# Patient Record
Sex: Male | Born: 1960 | Hispanic: Yes | Marital: Married | Smoking: Never smoker
Health system: Southern US, Community
[De-identification: ages and names within clinical notes are randomized; demographics above are authoritative.]

## PROBLEM LIST (undated history)

## (undated) DIAGNOSIS — G4733 Obstructive sleep apnea (adult) (pediatric): Secondary | ICD-10-CM

## (undated) DIAGNOSIS — I429 Cardiomyopathy, unspecified: Secondary | ICD-10-CM

## (undated) DIAGNOSIS — R0902 Hypoxemia: Secondary | ICD-10-CM

## (undated) DIAGNOSIS — R0600 Dyspnea, unspecified: Secondary | ICD-10-CM

## (undated) DIAGNOSIS — R9431 Abnormal electrocardiogram [ECG] [EKG]: Secondary | ICD-10-CM

## (undated) DIAGNOSIS — Z9989 Dependence on other enabling machines and devices: Secondary | ICD-10-CM

## (undated) DIAGNOSIS — I447 Left bundle-branch block, unspecified: Secondary | ICD-10-CM

## (undated) DIAGNOSIS — G8929 Other chronic pain: Secondary | ICD-10-CM

## (undated) DIAGNOSIS — J449 Chronic obstructive pulmonary disease, unspecified: Secondary | ICD-10-CM

## (undated) DIAGNOSIS — G4730 Sleep apnea, unspecified: Secondary | ICD-10-CM

## (undated) DIAGNOSIS — R519 Headache, unspecified: Secondary | ICD-10-CM

## (undated) DIAGNOSIS — I5022 Chronic systolic (congestive) heart failure: Secondary | ICD-10-CM

## (undated) DIAGNOSIS — R51 Headache: Secondary | ICD-10-CM

## (undated) DIAGNOSIS — T7840XA Allergy, unspecified, initial encounter: Secondary | ICD-10-CM

## (undated) DIAGNOSIS — R52 Pain, unspecified: Secondary | ICD-10-CM

## (undated) DIAGNOSIS — M549 Dorsalgia, unspecified: Secondary | ICD-10-CM

## (undated) DIAGNOSIS — I2510 Atherosclerotic heart disease of native coronary artery without angina pectoris: Secondary | ICD-10-CM

## (undated) DIAGNOSIS — I10 Essential (primary) hypertension: Secondary | ICD-10-CM

## (undated) DIAGNOSIS — F4310 Post-traumatic stress disorder, unspecified: Secondary | ICD-10-CM

## (undated) DIAGNOSIS — K219 Gastro-esophageal reflux disease without esophagitis: Secondary | ICD-10-CM

## (undated) HISTORY — PX: CARDIAC CATHETERIZATION: SHX172

## (undated) HISTORY — DX: Allergy, unspecified, initial encounter: T78.40XA

## (undated) HISTORY — DX: Left bundle-branch block, unspecified: I44.7

## (undated) HISTORY — DX: Dependence on other enabling machines and devices: Z99.89

## (undated) HISTORY — PX: THUMB ARTHROSCOPY: SHX2509

## (undated) HISTORY — DX: Obstructive sleep apnea (adult) (pediatric): G47.33

## (undated) HISTORY — DX: Chronic obstructive pulmonary disease, unspecified: J44.9

## (undated) HISTORY — DX: Hypoxemia: R09.02

## (undated) HISTORY — DX: Chronic systolic (congestive) heart failure: I50.22

## (undated) HISTORY — DX: Atherosclerotic heart disease of native coronary artery without angina pectoris: I25.10

## (undated) HISTORY — PX: ANKLE SURGERY: SHX546

## (undated) HISTORY — PX: HAND SURGERY: SHX662

---

## 2013-07-18 DIAGNOSIS — Z1389 Encounter for screening for other disorder: Secondary | ICD-10-CM | POA: Diagnosis not present

## 2013-07-18 DIAGNOSIS — Z7729 Contact with and (suspected ) exposure to other hazardous substances: Secondary | ICD-10-CM | POA: Diagnosis not present

## 2013-07-18 DIAGNOSIS — Z6829 Body mass index (BMI) 29.0-29.9, adult: Secondary | ICD-10-CM | POA: Diagnosis not present

## 2013-07-18 DIAGNOSIS — I059 Rheumatic mitral valve disease, unspecified: Secondary | ICD-10-CM | POA: Diagnosis not present

## 2013-07-18 DIAGNOSIS — I10 Essential (primary) hypertension: Secondary | ICD-10-CM | POA: Diagnosis not present

## 2013-07-18 DIAGNOSIS — I428 Other cardiomyopathies: Secondary | ICD-10-CM | POA: Diagnosis not present

## 2013-07-18 DIAGNOSIS — J40 Bronchitis, not specified as acute or chronic: Secondary | ICD-10-CM | POA: Diagnosis not present

## 2013-07-18 DIAGNOSIS — I5022 Chronic systolic (congestive) heart failure: Secondary | ICD-10-CM | POA: Diagnosis not present

## 2013-07-18 DIAGNOSIS — J45909 Unspecified asthma, uncomplicated: Secondary | ICD-10-CM | POA: Diagnosis not present

## 2013-07-18 DIAGNOSIS — Z0289 Encounter for other administrative examinations: Secondary | ICD-10-CM | POA: Diagnosis not present

## 2013-07-18 DIAGNOSIS — I509 Heart failure, unspecified: Secondary | ICD-10-CM | POA: Diagnosis not present

## 2013-07-27 DIAGNOSIS — R5381 Other malaise: Secondary | ICD-10-CM | POA: Diagnosis not present

## 2013-07-27 DIAGNOSIS — R5383 Other fatigue: Secondary | ICD-10-CM | POA: Diagnosis not present

## 2013-07-27 DIAGNOSIS — N529 Male erectile dysfunction, unspecified: Secondary | ICD-10-CM | POA: Diagnosis not present

## 2013-07-29 DIAGNOSIS — I447 Left bundle-branch block, unspecified: Secondary | ICD-10-CM | POA: Diagnosis not present

## 2013-07-29 DIAGNOSIS — J40 Bronchitis, not specified as acute or chronic: Secondary | ICD-10-CM | POA: Diagnosis not present

## 2013-07-29 DIAGNOSIS — I10 Essential (primary) hypertension: Secondary | ICD-10-CM | POA: Diagnosis not present

## 2013-07-29 DIAGNOSIS — Z1389 Encounter for screening for other disorder: Secondary | ICD-10-CM | POA: Diagnosis not present

## 2013-07-29 DIAGNOSIS — Z6829 Body mass index (BMI) 29.0-29.9, adult: Secondary | ICD-10-CM | POA: Diagnosis not present

## 2013-07-29 DIAGNOSIS — Z0289 Encounter for other administrative examinations: Secondary | ICD-10-CM | POA: Diagnosis not present

## 2013-07-29 DIAGNOSIS — G4733 Obstructive sleep apnea (adult) (pediatric): Secondary | ICD-10-CM | POA: Diagnosis not present

## 2013-07-29 DIAGNOSIS — R05 Cough: Secondary | ICD-10-CM | POA: Diagnosis not present

## 2013-07-29 DIAGNOSIS — Z6835 Body mass index (BMI) 35.0-35.9, adult: Secondary | ICD-10-CM | POA: Diagnosis not present

## 2013-07-29 DIAGNOSIS — R059 Cough, unspecified: Secondary | ICD-10-CM | POA: Diagnosis not present

## 2013-07-29 DIAGNOSIS — D649 Anemia, unspecified: Secondary | ICD-10-CM | POA: Diagnosis not present

## 2013-08-08 DIAGNOSIS — R1110 Vomiting, unspecified: Secondary | ICD-10-CM | POA: Diagnosis not present

## 2013-08-08 DIAGNOSIS — I2510 Atherosclerotic heart disease of native coronary artery without angina pectoris: Secondary | ICD-10-CM | POA: Diagnosis not present

## 2013-08-08 DIAGNOSIS — D45 Polycythemia vera: Secondary | ICD-10-CM | POA: Diagnosis not present

## 2013-08-08 DIAGNOSIS — T1490XA Injury, unspecified, initial encounter: Secondary | ICD-10-CM | POA: Diagnosis not present

## 2013-08-08 DIAGNOSIS — R059 Cough, unspecified: Secondary | ICD-10-CM | POA: Diagnosis not present

## 2013-08-08 DIAGNOSIS — Z1389 Encounter for screening for other disorder: Secondary | ICD-10-CM | POA: Diagnosis not present

## 2013-08-08 DIAGNOSIS — M545 Low back pain, unspecified: Secondary | ICD-10-CM | POA: Diagnosis not present

## 2013-08-08 DIAGNOSIS — R7989 Other specified abnormal findings of blood chemistry: Secondary | ICD-10-CM | POA: Diagnosis not present

## 2013-08-08 DIAGNOSIS — I10 Essential (primary) hypertension: Secondary | ICD-10-CM | POA: Diagnosis not present

## 2013-08-08 DIAGNOSIS — R918 Other nonspecific abnormal finding of lung field: Secondary | ICD-10-CM | POA: Diagnosis not present

## 2013-08-08 DIAGNOSIS — R05 Cough: Secondary | ICD-10-CM | POA: Diagnosis not present

## 2013-08-08 DIAGNOSIS — E559 Vitamin D deficiency, unspecified: Secondary | ICD-10-CM | POA: Diagnosis not present

## 2013-08-08 DIAGNOSIS — D751 Secondary polycythemia: Secondary | ICD-10-CM | POA: Diagnosis not present

## 2013-08-08 DIAGNOSIS — I509 Heart failure, unspecified: Secondary | ICD-10-CM | POA: Diagnosis not present

## 2013-08-08 DIAGNOSIS — I428 Other cardiomyopathies: Secondary | ICD-10-CM | POA: Diagnosis not present

## 2013-08-09 DIAGNOSIS — R911 Solitary pulmonary nodule: Secondary | ICD-10-CM | POA: Diagnosis not present

## 2013-08-09 DIAGNOSIS — R0989 Other specified symptoms and signs involving the circulatory and respiratory systems: Secondary | ICD-10-CM | POA: Diagnosis not present

## 2013-08-09 DIAGNOSIS — R0609 Other forms of dyspnea: Secondary | ICD-10-CM | POA: Diagnosis not present

## 2013-08-09 DIAGNOSIS — R059 Cough, unspecified: Secondary | ICD-10-CM | POA: Diagnosis not present

## 2013-08-09 DIAGNOSIS — D45 Polycythemia vera: Secondary | ICD-10-CM | POA: Diagnosis not present

## 2013-08-09 DIAGNOSIS — I428 Other cardiomyopathies: Secondary | ICD-10-CM | POA: Diagnosis not present

## 2013-08-09 DIAGNOSIS — Q249 Congenital malformation of heart, unspecified: Secondary | ICD-10-CM | POA: Diagnosis not present

## 2013-08-09 DIAGNOSIS — Z6829 Body mass index (BMI) 29.0-29.9, adult: Secondary | ICD-10-CM | POA: Diagnosis not present

## 2013-08-09 DIAGNOSIS — R05 Cough: Secondary | ICD-10-CM | POA: Diagnosis not present

## 2013-08-09 DIAGNOSIS — R042 Hemoptysis: Secondary | ICD-10-CM | POA: Diagnosis not present

## 2013-08-09 DIAGNOSIS — R6889 Other general symptoms and signs: Secondary | ICD-10-CM | POA: Diagnosis not present

## 2013-08-09 DIAGNOSIS — I509 Heart failure, unspecified: Secondary | ICD-10-CM | POA: Diagnosis not present

## 2013-08-09 DIAGNOSIS — I10 Essential (primary) hypertension: Secondary | ICD-10-CM | POA: Diagnosis not present

## 2013-08-10 DIAGNOSIS — IMO0002 Reserved for concepts with insufficient information to code with codable children: Secondary | ICD-10-CM | POA: Diagnosis not present

## 2013-08-10 DIAGNOSIS — G4733 Obstructive sleep apnea (adult) (pediatric): Secondary | ICD-10-CM | POA: Diagnosis not present

## 2013-08-10 DIAGNOSIS — M539 Dorsopathy, unspecified: Secondary | ICD-10-CM | POA: Diagnosis not present

## 2013-08-10 DIAGNOSIS — M5126 Other intervertebral disc displacement, lumbar region: Secondary | ICD-10-CM | POA: Diagnosis not present

## 2013-08-10 DIAGNOSIS — IMO0001 Reserved for inherently not codable concepts without codable children: Secondary | ICD-10-CM | POA: Diagnosis not present

## 2013-08-11 DIAGNOSIS — M545 Low back pain, unspecified: Secondary | ICD-10-CM | POA: Diagnosis not present

## 2013-08-11 DIAGNOSIS — Z09 Encounter for follow-up examination after completed treatment for conditions other than malignant neoplasm: Secondary | ICD-10-CM | POA: Diagnosis not present

## 2013-08-11 DIAGNOSIS — I10 Essential (primary) hypertension: Secondary | ICD-10-CM | POA: Diagnosis not present

## 2013-08-18 DIAGNOSIS — G4733 Obstructive sleep apnea (adult) (pediatric): Secondary | ICD-10-CM | POA: Diagnosis not present

## 2013-08-24 DIAGNOSIS — E041 Nontoxic single thyroid nodule: Secondary | ICD-10-CM | POA: Diagnosis not present

## 2013-08-24 DIAGNOSIS — I428 Other cardiomyopathies: Secondary | ICD-10-CM | POA: Diagnosis not present

## 2013-08-24 DIAGNOSIS — R911 Solitary pulmonary nodule: Secondary | ICD-10-CM | POA: Diagnosis not present

## 2013-08-24 DIAGNOSIS — Z6829 Body mass index (BMI) 29.0-29.9, adult: Secondary | ICD-10-CM | POA: Diagnosis not present

## 2013-08-24 DIAGNOSIS — D45 Polycythemia vera: Secondary | ICD-10-CM | POA: Diagnosis not present

## 2013-08-24 DIAGNOSIS — Z1389 Encounter for screening for other disorder: Secondary | ICD-10-CM | POA: Diagnosis not present

## 2013-08-24 DIAGNOSIS — G4733 Obstructive sleep apnea (adult) (pediatric): Secondary | ICD-10-CM | POA: Diagnosis not present

## 2013-08-24 DIAGNOSIS — J45902 Unspecified asthma with status asthmaticus: Secondary | ICD-10-CM | POA: Diagnosis not present

## 2013-08-24 DIAGNOSIS — N529 Male erectile dysfunction, unspecified: Secondary | ICD-10-CM | POA: Diagnosis not present

## 2013-09-06 DIAGNOSIS — I10 Essential (primary) hypertension: Secondary | ICD-10-CM | POA: Diagnosis not present

## 2013-09-06 DIAGNOSIS — R059 Cough, unspecified: Secondary | ICD-10-CM | POA: Diagnosis not present

## 2013-09-06 DIAGNOSIS — Q339 Congenital malformation of lung, unspecified: Secondary | ICD-10-CM | POA: Diagnosis not present

## 2013-09-06 DIAGNOSIS — R05 Cough: Secondary | ICD-10-CM | POA: Diagnosis not present

## 2013-10-11 DIAGNOSIS — I428 Other cardiomyopathies: Secondary | ICD-10-CM | POA: Diagnosis not present

## 2013-10-11 DIAGNOSIS — J45909 Unspecified asthma, uncomplicated: Secondary | ICD-10-CM | POA: Diagnosis not present

## 2013-10-11 DIAGNOSIS — Z6830 Body mass index (BMI) 30.0-30.9, adult: Secondary | ICD-10-CM | POA: Diagnosis not present

## 2013-10-11 DIAGNOSIS — G4733 Obstructive sleep apnea (adult) (pediatric): Secondary | ICD-10-CM | POA: Diagnosis not present

## 2013-10-11 DIAGNOSIS — R911 Solitary pulmonary nodule: Secondary | ICD-10-CM | POA: Diagnosis not present

## 2013-10-11 DIAGNOSIS — D45 Polycythemia vera: Secondary | ICD-10-CM | POA: Diagnosis not present

## 2013-10-18 DIAGNOSIS — I10 Essential (primary) hypertension: Secondary | ICD-10-CM | POA: Diagnosis not present

## 2013-10-18 DIAGNOSIS — I428 Other cardiomyopathies: Secondary | ICD-10-CM | POA: Diagnosis not present

## 2013-10-18 DIAGNOSIS — Z09 Encounter for follow-up examination after completed treatment for conditions other than malignant neoplasm: Secondary | ICD-10-CM | POA: Diagnosis not present

## 2013-10-24 DIAGNOSIS — I5022 Chronic systolic (congestive) heart failure: Secondary | ICD-10-CM | POA: Diagnosis not present

## 2013-10-24 DIAGNOSIS — I119 Hypertensive heart disease without heart failure: Secondary | ICD-10-CM | POA: Diagnosis not present

## 2013-10-24 DIAGNOSIS — E669 Obesity, unspecified: Secondary | ICD-10-CM | POA: Diagnosis not present

## 2013-10-24 DIAGNOSIS — I059 Rheumatic mitral valve disease, unspecified: Secondary | ICD-10-CM | POA: Diagnosis not present

## 2013-11-22 DIAGNOSIS — R911 Solitary pulmonary nodule: Secondary | ICD-10-CM | POA: Diagnosis not present

## 2013-11-22 DIAGNOSIS — E049 Nontoxic goiter, unspecified: Secondary | ICD-10-CM | POA: Diagnosis not present

## 2013-11-22 DIAGNOSIS — Z09 Encounter for follow-up examination after completed treatment for conditions other than malignant neoplasm: Secondary | ICD-10-CM | POA: Diagnosis not present

## 2013-11-22 DIAGNOSIS — I428 Other cardiomyopathies: Secondary | ICD-10-CM | POA: Diagnosis not present

## 2013-11-22 DIAGNOSIS — J45909 Unspecified asthma, uncomplicated: Secondary | ICD-10-CM | POA: Diagnosis not present

## 2013-11-22 DIAGNOSIS — G4733 Obstructive sleep apnea (adult) (pediatric): Secondary | ICD-10-CM | POA: Diagnosis not present

## 2013-11-22 DIAGNOSIS — Z6829 Body mass index (BMI) 29.0-29.9, adult: Secondary | ICD-10-CM | POA: Diagnosis not present

## 2013-11-22 DIAGNOSIS — R918 Other nonspecific abnormal finding of lung field: Secondary | ICD-10-CM | POA: Diagnosis not present

## 2013-11-24 DIAGNOSIS — G4730 Sleep apnea, unspecified: Secondary | ICD-10-CM | POA: Diagnosis not present

## 2013-11-24 DIAGNOSIS — I422 Other hypertrophic cardiomyopathy: Secondary | ICD-10-CM | POA: Diagnosis not present

## 2013-11-24 DIAGNOSIS — Z0289 Encounter for other administrative examinations: Secondary | ICD-10-CM | POA: Diagnosis not present

## 2013-11-24 DIAGNOSIS — D45 Polycythemia vera: Secondary | ICD-10-CM | POA: Diagnosis not present

## 2013-11-24 DIAGNOSIS — Z1389 Encounter for screening for other disorder: Secondary | ICD-10-CM | POA: Diagnosis not present

## 2013-11-24 DIAGNOSIS — Z6830 Body mass index (BMI) 30.0-30.9, adult: Secondary | ICD-10-CM | POA: Diagnosis not present

## 2013-11-24 DIAGNOSIS — I10 Essential (primary) hypertension: Secondary | ICD-10-CM | POA: Diagnosis not present

## 2013-11-28 DIAGNOSIS — E041 Nontoxic single thyroid nodule: Secondary | ICD-10-CM | POA: Diagnosis not present

## 2013-11-30 DIAGNOSIS — Z0189 Encounter for other specified special examinations: Secondary | ICD-10-CM | POA: Diagnosis not present

## 2013-11-30 DIAGNOSIS — D45 Polycythemia vera: Secondary | ICD-10-CM | POA: Diagnosis not present

## 2013-11-30 DIAGNOSIS — I509 Heart failure, unspecified: Secondary | ICD-10-CM | POA: Diagnosis not present

## 2013-11-30 DIAGNOSIS — I428 Other cardiomyopathies: Secondary | ICD-10-CM | POA: Diagnosis not present

## 2013-11-30 DIAGNOSIS — Z0000 Encounter for general adult medical examination without abnormal findings: Secondary | ICD-10-CM | POA: Diagnosis not present

## 2013-11-30 DIAGNOSIS — Z125 Encounter for screening for malignant neoplasm of prostate: Secondary | ICD-10-CM | POA: Diagnosis not present

## 2013-11-30 DIAGNOSIS — Z1159 Encounter for screening for other viral diseases: Secondary | ICD-10-CM | POA: Diagnosis not present

## 2013-11-30 DIAGNOSIS — I447 Left bundle-branch block, unspecified: Secondary | ICD-10-CM | POA: Diagnosis not present

## 2013-11-30 DIAGNOSIS — B372 Candidiasis of skin and nail: Secondary | ICD-10-CM | POA: Diagnosis not present

## 2013-11-30 DIAGNOSIS — R3129 Other microscopic hematuria: Secondary | ICD-10-CM | POA: Diagnosis not present

## 2013-11-30 DIAGNOSIS — G4730 Sleep apnea, unspecified: Secondary | ICD-10-CM | POA: Diagnosis not present

## 2013-11-30 DIAGNOSIS — E042 Nontoxic multinodular goiter: Secondary | ICD-10-CM | POA: Diagnosis not present

## 2013-11-30 DIAGNOSIS — I10 Essential (primary) hypertension: Secondary | ICD-10-CM | POA: Diagnosis not present

## 2013-12-07 DIAGNOSIS — I10 Essential (primary) hypertension: Secondary | ICD-10-CM | POA: Diagnosis not present

## 2013-12-22 DIAGNOSIS — R911 Solitary pulmonary nodule: Secondary | ICD-10-CM | POA: Diagnosis not present

## 2013-12-22 DIAGNOSIS — Z6830 Body mass index (BMI) 30.0-30.9, adult: Secondary | ICD-10-CM | POA: Diagnosis not present

## 2013-12-22 DIAGNOSIS — R209 Unspecified disturbances of skin sensation: Secondary | ICD-10-CM | POA: Diagnosis not present

## 2013-12-22 DIAGNOSIS — G4733 Obstructive sleep apnea (adult) (pediatric): Secondary | ICD-10-CM | POA: Diagnosis not present

## 2013-12-22 DIAGNOSIS — M79609 Pain in unspecified limb: Secondary | ICD-10-CM | POA: Diagnosis not present

## 2013-12-28 DIAGNOSIS — Z131 Encounter for screening for diabetes mellitus: Secondary | ICD-10-CM | POA: Diagnosis not present

## 2013-12-28 DIAGNOSIS — E042 Nontoxic multinodular goiter: Secondary | ICD-10-CM | POA: Diagnosis not present

## 2013-12-28 DIAGNOSIS — Z1389 Encounter for screening for other disorder: Secondary | ICD-10-CM | POA: Diagnosis not present

## 2013-12-28 DIAGNOSIS — I428 Other cardiomyopathies: Secondary | ICD-10-CM | POA: Diagnosis not present

## 2013-12-28 DIAGNOSIS — I509 Heart failure, unspecified: Secondary | ICD-10-CM | POA: Diagnosis not present

## 2013-12-28 DIAGNOSIS — I10 Essential (primary) hypertension: Secondary | ICD-10-CM | POA: Diagnosis not present

## 2014-01-02 DIAGNOSIS — I10 Essential (primary) hypertension: Secondary | ICD-10-CM | POA: Diagnosis not present

## 2014-01-02 DIAGNOSIS — D751 Secondary polycythemia: Secondary | ICD-10-CM | POA: Diagnosis not present

## 2014-01-05 DIAGNOSIS — B351 Tinea unguium: Secondary | ICD-10-CM | POA: Diagnosis not present

## 2014-01-05 DIAGNOSIS — Z6830 Body mass index (BMI) 30.0-30.9, adult: Secondary | ICD-10-CM | POA: Diagnosis not present

## 2014-01-05 DIAGNOSIS — E041 Nontoxic single thyroid nodule: Secondary | ICD-10-CM | POA: Diagnosis not present

## 2014-01-05 DIAGNOSIS — I10 Essential (primary) hypertension: Secondary | ICD-10-CM | POA: Diagnosis not present

## 2014-01-05 DIAGNOSIS — B958 Unspecified staphylococcus as the cause of diseases classified elsewhere: Secondary | ICD-10-CM | POA: Diagnosis not present

## 2014-01-06 DIAGNOSIS — E781 Pure hyperglyceridemia: Secondary | ICD-10-CM | POA: Diagnosis not present

## 2014-01-06 DIAGNOSIS — I10 Essential (primary) hypertension: Secondary | ICD-10-CM | POA: Diagnosis not present

## 2014-01-09 DIAGNOSIS — D750 Familial erythrocytosis: Secondary | ICD-10-CM | POA: Diagnosis not present

## 2014-01-09 DIAGNOSIS — D3000 Benign neoplasm of unspecified kidney: Secondary | ICD-10-CM | POA: Diagnosis not present

## 2014-01-09 DIAGNOSIS — K7689 Other specified diseases of liver: Secondary | ICD-10-CM | POA: Diagnosis not present

## 2014-01-11 DIAGNOSIS — R109 Unspecified abdominal pain: Secondary | ICD-10-CM | POA: Diagnosis not present

## 2014-01-30 DIAGNOSIS — I5022 Chronic systolic (congestive) heart failure: Secondary | ICD-10-CM | POA: Diagnosis not present

## 2014-01-30 DIAGNOSIS — E669 Obesity, unspecified: Secondary | ICD-10-CM | POA: Diagnosis not present

## 2014-01-30 DIAGNOSIS — I119 Hypertensive heart disease without heart failure: Secondary | ICD-10-CM | POA: Diagnosis not present

## 2014-01-30 DIAGNOSIS — I059 Rheumatic mitral valve disease, unspecified: Secondary | ICD-10-CM | POA: Diagnosis not present

## 2014-01-31 DIAGNOSIS — Z6830 Body mass index (BMI) 30.0-30.9, adult: Secondary | ICD-10-CM | POA: Diagnosis not present

## 2014-01-31 DIAGNOSIS — E049 Nontoxic goiter, unspecified: Secondary | ICD-10-CM | POA: Diagnosis not present

## 2014-01-31 DIAGNOSIS — E041 Nontoxic single thyroid nodule: Secondary | ICD-10-CM | POA: Diagnosis not present

## 2014-01-31 DIAGNOSIS — R911 Solitary pulmonary nodule: Secondary | ICD-10-CM | POA: Diagnosis not present

## 2014-01-31 DIAGNOSIS — G4733 Obstructive sleep apnea (adult) (pediatric): Secondary | ICD-10-CM | POA: Diagnosis not present

## 2014-01-31 DIAGNOSIS — J309 Allergic rhinitis, unspecified: Secondary | ICD-10-CM | POA: Diagnosis not present

## 2014-02-16 DIAGNOSIS — I2510 Atherosclerotic heart disease of native coronary artery without angina pectoris: Secondary | ICD-10-CM | POA: Diagnosis not present

## 2014-02-16 DIAGNOSIS — R079 Chest pain, unspecified: Secondary | ICD-10-CM | POA: Diagnosis not present

## 2014-03-14 DIAGNOSIS — R911 Solitary pulmonary nodule: Secondary | ICD-10-CM | POA: Diagnosis not present

## 2014-03-14 DIAGNOSIS — Z6830 Body mass index (BMI) 30.0-30.9, adult: Secondary | ICD-10-CM | POA: Diagnosis not present

## 2014-03-14 DIAGNOSIS — G4733 Obstructive sleep apnea (adult) (pediatric): Secondary | ICD-10-CM | POA: Diagnosis not present

## 2014-03-14 DIAGNOSIS — E049 Nontoxic goiter, unspecified: Secondary | ICD-10-CM | POA: Diagnosis not present

## 2014-03-17 DIAGNOSIS — K909 Intestinal malabsorption, unspecified: Secondary | ICD-10-CM | POA: Diagnosis not present

## 2014-03-17 DIAGNOSIS — C3490 Malignant neoplasm of unspecified part of unspecified bronchus or lung: Secondary | ICD-10-CM | POA: Diagnosis not present

## 2014-03-17 DIAGNOSIS — Z79899 Other long term (current) drug therapy: Secondary | ICD-10-CM | POA: Diagnosis not present

## 2014-03-17 DIAGNOSIS — D45 Polycythemia vera: Secondary | ICD-10-CM | POA: Diagnosis not present

## 2014-03-17 DIAGNOSIS — C19 Malignant neoplasm of rectosigmoid junction: Secondary | ICD-10-CM | POA: Diagnosis not present

## 2014-04-12 DIAGNOSIS — D751 Secondary polycythemia: Secondary | ICD-10-CM | POA: Diagnosis not present

## 2014-04-13 DIAGNOSIS — G4733 Obstructive sleep apnea (adult) (pediatric): Secondary | ICD-10-CM | POA: Diagnosis not present

## 2014-04-13 DIAGNOSIS — Z6830 Body mass index (BMI) 30.0-30.9, adult: Secondary | ICD-10-CM | POA: Diagnosis not present

## 2014-04-13 DIAGNOSIS — D45 Polycythemia vera: Secondary | ICD-10-CM | POA: Diagnosis not present

## 2014-04-13 DIAGNOSIS — I10 Essential (primary) hypertension: Secondary | ICD-10-CM | POA: Diagnosis not present

## 2014-04-13 DIAGNOSIS — B351 Tinea unguium: Secondary | ICD-10-CM | POA: Diagnosis not present

## 2014-05-01 DIAGNOSIS — I5022 Chronic systolic (congestive) heart failure: Secondary | ICD-10-CM | POA: Diagnosis not present

## 2014-05-01 DIAGNOSIS — I10 Essential (primary) hypertension: Secondary | ICD-10-CM | POA: Diagnosis not present

## 2014-05-01 DIAGNOSIS — I340 Nonrheumatic mitral (valve) insufficiency: Secondary | ICD-10-CM | POA: Diagnosis not present

## 2014-07-13 DIAGNOSIS — G4733 Obstructive sleep apnea (adult) (pediatric): Secondary | ICD-10-CM | POA: Diagnosis not present

## 2014-07-13 DIAGNOSIS — Z136 Encounter for screening for cardiovascular disorders: Secondary | ICD-10-CM | POA: Diagnosis not present

## 2014-07-13 DIAGNOSIS — Z6830 Body mass index (BMI) 30.0-30.9, adult: Secondary | ICD-10-CM | POA: Diagnosis not present

## 2014-07-13 DIAGNOSIS — Z87891 Personal history of nicotine dependence: Secondary | ICD-10-CM | POA: Diagnosis not present

## 2014-07-13 DIAGNOSIS — R911 Solitary pulmonary nodule: Secondary | ICD-10-CM | POA: Diagnosis not present

## 2014-07-13 DIAGNOSIS — J309 Allergic rhinitis, unspecified: Secondary | ICD-10-CM | POA: Diagnosis not present

## 2014-07-13 DIAGNOSIS — Z9189 Other specified personal risk factors, not elsewhere classified: Secondary | ICD-10-CM | POA: Diagnosis not present

## 2014-07-22 ENCOUNTER — Encounter (HOSPITAL_BASED_OUTPATIENT_CLINIC_OR_DEPARTMENT_OTHER): Payer: Self-pay | Admitting: *Deleted

## 2014-07-22 ENCOUNTER — Emergency Department (HOSPITAL_BASED_OUTPATIENT_CLINIC_OR_DEPARTMENT_OTHER)
Admission: EM | Admit: 2014-07-22 | Discharge: 2014-07-22 | Disposition: A | Discharge status: Home / Self Care | Payer: Medicare Other | Attending: Emergency Medicine | Admitting: Emergency Medicine

## 2014-07-22 DIAGNOSIS — I10 Essential (primary) hypertension: Secondary | ICD-10-CM | POA: Diagnosis not present

## 2014-07-22 DIAGNOSIS — K648 Other hemorrhoids: Secondary | ICD-10-CM

## 2014-07-22 DIAGNOSIS — Z9889 Other specified postprocedural states: Secondary | ICD-10-CM | POA: Diagnosis not present

## 2014-07-22 DIAGNOSIS — Z8669 Personal history of other diseases of the nervous system and sense organs: Secondary | ICD-10-CM | POA: Insufficient documentation

## 2014-07-22 DIAGNOSIS — K625 Hemorrhage of anus and rectum: Secondary | ICD-10-CM | POA: Diagnosis not present

## 2014-07-22 DIAGNOSIS — Z79899 Other long term (current) drug therapy: Secondary | ICD-10-CM | POA: Diagnosis not present

## 2014-07-22 DIAGNOSIS — Z7982 Long term (current) use of aspirin: Secondary | ICD-10-CM | POA: Insufficient documentation

## 2014-07-22 HISTORY — DX: Essential (primary) hypertension: I10

## 2014-07-22 HISTORY — DX: Sleep apnea, unspecified: G47.30

## 2014-07-22 HISTORY — DX: Cardiomyopathy, unspecified: I42.9

## 2014-07-22 LAB — OCCULT BLOOD X 1 CARD TO LAB, STOOL: Fecal Occult Bld: POSITIVE — AB

## 2014-07-22 MED ORDER — HYDROCORTISONE 2.5 % RE CREA
TOPICAL_CREAM | RECTAL | Status: DC
Start: 2014-07-22 — End: 2015-06-04

## 2014-07-22 NOTE — ED Notes (Signed)
Pt reports recent car trip Tuesday from [place]- states noticed "bump" near rectum- now noticing bleeding when he wipes after BM

## 2014-07-22 NOTE — ED Provider Notes (Signed)
CSN: [id number]     Arrival date & time 07/22/14  5035 History   First MD Initiated Contact with Patient 07/22/14 1042     Chief Complaint  Patient presents with  . Rectal Bleeding     (Consider location/radiation/quality/duration/timing/severity/associated sxs/prior Treatment) HPI  Pt presenting with c/o rectal bleeding with wiping.  Pt states he was sitting for a long period of time in a car and noticed a bump in his rectum.  Then began to notice bleeding.  Lump is nonpainful.  No fever/chills. Sees bright red blood with wiping.  There are no other associated systemic symptoms, there are no other alleviating or modifying factors.   Past Medical History  Diagnosis Date  . Cardiomyopathy   . Hypertension   . Sleep apnea    Past Surgical History  Procedure Laterality Date  . Hand surgery Bilateral   . Ankle surgery Right   . Cardiac catheterization     No family history on file. History  Substance Use Topics  . Smoking status: Never Smoker   . Smokeless tobacco: Never Used  . Alcohol Use: No    Review of Systems  ROS reviewed and all otherwise negative except for mentioned in HPI    Allergies  Review of patient's allergies indicates no known allergies.  Home Medications   Prior to Admission medications   Medication Sig Start Date End Date Taking? Authorizing Provider  amLODipine (NORVASC) 10 MG tablet Take 10 mg by mouth daily.   Yes Historical Provider, MD  aspirin 81 MG tablet Take 81 mg by mouth daily.   Yes Historical Provider, MD  carvedilol (COREG) 25 MG tablet Take 25 mg by mouth 2 (two) times daily with a meal.   Yes Historical Provider, MD  doxazosin (CARDURA) 8 MG tablet Take 8 mg by mouth daily.   Yes Historical Provider, MD  hydrALAZINE (APRESOLINE) 50 MG tablet Take 50 mg by mouth 3 (three) times daily.   Yes Historical Provider, MD  ISOSORBIDE MONONITRATE PO Take 60 mg by mouth 2 (two) times daily.   Yes Historical Provider, MD  quinapril (ACCUPRIL) 40  MG tablet Take 40 mg by mouth daily.   Yes Historical Provider, MD  spironolactone (ALDACTONE) 25 MG tablet Take 25 mg by mouth daily.   Yes Historical Provider, MD  hydrocortisone (ANUSOL-HC) 2.5 % rectal cream Apply rectally 2 times daily 07/22/14   Threasa Beards, MD   BP 162/100 mmHg  Pulse 72  Temp(Src) 98.4 F (36.9 C) (Oral)  Resp 16  Ht 5\' 9"  (1.753 m)  Wt 198 lb (89.812 kg)  BMI 29.23 kg/m2  SpO2 100%  Vitals reviewed Physical Exam  Physical Examination: General appearance - alert, well appearing, and in no distress Mental status - alert, oriented to person, place, and time Eyes -no conjunctival injection, no scleral icterus Chest - clear to auscultation, no wheezes, rales or rhonchi, symmetric air entry Heart - normal rate, regular rhythm, normal S1, S2, no murmurs, rubs, clicks or gallops Abdomen - soft, nontender, nondistended, no masses or organomegaly Rectal - no tenderness, internal hemorrhoid palpable at 9 o'clock position, skin tag at 6 o'clock position, scant pink blood tinge on glove, no tenderness to palpation Extremities - peripheral pulses normal, no pedal edema, no clubbing or cyanosis Skin - normal coloration and turgor, no rashes  ED Course  Procedures (including critical care time) Labs Review Labs Reviewed  OCCULT BLOOD X 1 CARD TO LAB, STOOL - Abnormal; Notable for the following:  Fecal Occult Bld POSITIVE (*)    All other components within normal limits    Imaging Review No results found.   EKG Interpretation None      MDM   Final diagnoses:  Internal hemorrhoid, bleeding    Pt presenting with rectal bleeding, he has a small internal hemorrhoid with scant bleeding. No signs of abscess.  No frank blood in stool.  Pt given rx for steroid cream- information for followup.  Discharged with strict return precautions.  Pt agreeable with plan.    Threasa Beards, MD 07/22/14 [phone]

## 2014-07-22 NOTE — Discharge Instructions (Signed)
Return to the ED with any concerns including continued bleeding with fainting or dizziness, abdominal pain, fever/chills, decreased level of alertness/lethargy, or any other alarming symptoms

## 2014-07-22 NOTE — ED Notes (Signed)
rx x 1 given for anusol- d/c with family

## 2014-07-28 ENCOUNTER — Ambulatory Visit (INDEPENDENT_AMBULATORY_CARE_PROVIDER_SITE_OTHER): Payer: Medicare Other | Admitting: Medical

## 2014-07-28 ENCOUNTER — Encounter: Payer: Self-pay | Admitting: Medical

## 2014-07-28 VITALS — BP 180/90 | HR 85 | Temp 98.3°F | Ht 67.75 in | Wt 204.2 lb

## 2014-07-28 DIAGNOSIS — K649 Unspecified hemorrhoids: Secondary | ICD-10-CM | POA: Insufficient documentation

## 2014-07-28 DIAGNOSIS — I10 Essential (primary) hypertension: Secondary | ICD-10-CM | POA: Diagnosis not present

## 2014-07-28 DIAGNOSIS — J3089 Other allergic rhinitis: Secondary | ICD-10-CM | POA: Diagnosis not present

## 2014-07-28 DIAGNOSIS — J449 Chronic obstructive pulmonary disease, unspecified: Secondary | ICD-10-CM

## 2014-07-28 DIAGNOSIS — Z1211 Encounter for screening for malignant neoplasm of colon: Secondary | ICD-10-CM | POA: Diagnosis not present

## 2014-07-28 DIAGNOSIS — I429 Cardiomyopathy, unspecified: Secondary | ICD-10-CM | POA: Diagnosis not present

## 2014-07-28 DIAGNOSIS — J309 Allergic rhinitis, unspecified: Secondary | ICD-10-CM | POA: Insufficient documentation

## 2014-07-28 DIAGNOSIS — D751 Secondary polycythemia: Secondary | ICD-10-CM | POA: Diagnosis not present

## 2014-07-28 LAB — COMPREHENSIVE METABOLIC PANEL
ALT: 40 U/L (ref 0–53)
AST: 25 U/L (ref 0–37)
Albumin: 4.2 g/dL (ref 3.5–5.2)
Alkaline Phosphatase: 74 U/L (ref 39–117)
BUN: 15 mg/dL (ref 6–23)
CO2: 30 mEq/L (ref 19–32)
Calcium: 9.5 mg/dL (ref 8.4–10.5)
Chloride: 105 mEq/L (ref 96–112)
Creatinine, Ser: 1.12 mg/dL (ref 0.40–1.50)
GFR: 72.44 mL/min (ref >60.00)
Glucose, Bld: 91 mg/dL (ref 70–99)
Potassium: 4.1 mEq/L (ref 3.5–5.1)
Sodium: 139 mEq/L (ref 135–145)
Total Bilirubin: 0.5 mg/dL (ref 0.2–1.2)
Total Protein: 7.1 g/dL (ref 6.0–8.3)

## 2014-07-28 LAB — CBC WITH DIFFERENTIAL/PLATELET
Basophils Absolute: 0 10*3/uL (ref 0.0–0.1)
Basophils Relative: 0.4 % (ref 0.0–3.0)
Eosinophils Absolute: 0.1 10*3/uL (ref 0.0–0.7)
Eosinophils Relative: 1.4 % (ref 0.0–5.0)
HCT: 48.9 % (ref 39.0–52.0)
Hemoglobin: 16.9 g/dL (ref 13.0–17.0)
Lymphocytes Relative: 26.3 % (ref 12.0–46.0)
Lymphs Abs: 1.8 10*3/uL (ref 0.7–4.0)
MCHC: 34.4 g/dL (ref 30.0–36.0)
MCV: 91.8 fl (ref 78.0–100.0)
Monocytes Absolute: 0.6 10*3/uL (ref 0.1–1.0)
Monocytes Relative: 8.3 % (ref 3.0–12.0)
Neutro Abs: 4.3 10*3/uL (ref 1.4–7.7)
Neutrophils Relative %: 63.6 % (ref 43.0–77.0)
Platelets: 244 10*3/uL (ref 150.0–400.0)
RBC: 5.33 Mil/uL (ref 4.22–5.81)
RDW: 13.4 % (ref 11.5–15.5)
WBC: 6.8 10*3/uL (ref 4.0–10.5)

## 2014-07-28 NOTE — Assessment & Plan Note (Signed)
Improved stay on current tx.anuso hc.

## 2014-07-28 NOTE — Assessment & Plan Note (Signed)
Presently mild no exacerbation. Will follow and rx inhalers if needed in future.

## 2014-07-28 NOTE — Assessment & Plan Note (Signed)
Poor response to otc meds. None presently.

## 2014-07-28 NOTE — Progress Notes (Signed)
Pre visit review using our clinic review tool, if applicable. No additional management support is needed unless otherwise documented below in the visit note. 

## 2014-07-28 NOTE — Assessment & Plan Note (Addendum)
Will follow. Depending on how doing clinically in [place]. May establish you with local cardiologist. For present you have [place] cardiologist and will see this Spring.  Note pt cardiologist in [place]. Phone [phone]

## 2014-07-28 NOTE — Assessment & Plan Note (Addendum)
On various meds yet bp still high. You decline med change today. Please check bp daily at home and bring in log book of readings. So we can determine extent of adjustment needed. If any cardiac or neurologic signs or symptoms then ED evaluation.  cmp today.

## 2014-07-28 NOTE — Patient Instructions (Addendum)
COPD, mild Presently mild no exacerbation. Will follow and rx inhalers if needed in future.     Cardiomyopathy Will follow. Depending on how doing clinically in [place]. May establish you with local cardiologist. For present you have [facility] cardiologist and will see this Spring.   HTN (hypertension) On various meds yet bp still high. You decline med change today. Please check bp daily at home and bring in log book of readings. So we can determine extent of adjustment needed. If any cardiac or neurologic signs or symptoms then ED evaluation.  cmp today.   Hemorrhoid Improved stay on current tx.anuso hc.   Polycythemia Will get cbc today.     Follow up in 10 days or as needed.

## 2014-07-28 NOTE — Progress Notes (Signed)
Subjective:    Patient ID: Matthew Nixon, male    DOB: 1960/12/16, 54 y.o.   MRN: [id number]  HPI   I have reviewed pt PMH, PSH, FH, Social History and Surgical History  allegies- spring time is worst season. Pt tries various otc meds. Only thing that works is being inside and avoiding exposure.  Copd- Pt states he had exposure to 911 dust. He worked a couple of blocks away from towers [place]. Pt states breathing problems gradual since then.  Cardiomyopathy- Pt states he has low ejection fraction.   Htn- Pt blood pressure is high. He states he has been checking his bp at home recently. Pt recent blood pressure at home 175/85.  This is his baseline trend even with various meds and when he saw cardiolgist. Last time saw cardiologist was 2 months ago. His cardiologist is in [place].  Sleep apnea- He has a machine.  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 Nonsmoker, no alcohol, Some caffeine 1 cup a day. Married- 4 children.  Pt has recent hemorrhoids. This occurred driving down from [place]. Felt lump near rectum past Thursday. Friday some bleeding. Saturday some more. He went to ED and found hemorrhoid. Pt states bleeding stopped. But still some swelling. Pt is being treated by rx given by ED.   Pt does want referral to GI for screening colonoscopy.  Pt has hx of polycythemia since the summer.       Review of Systems  Constitutional: Negative for fever, chills, diaphoresis, activity change and fatigue.  Respiratory: Negative for cough, chest tightness and shortness of breath.   Cardiovascular: Negative for chest pain, palpitations and leg swelling.  Gastrointestinal: Negative for nausea, vomiting and abdominal pain.       Some hemorrhoids recently. See hop.  Musculoskeletal: Negative for neck pain and neck stiffness.  Neurological: Negative for dizziness, tremors, seizures, syncope, facial asymmetry, speech difficulty,  weakness, light-headedness, numbness and headaches.  Psychiatric/Behavioral: Negative for behavioral problems, confusion and agitation. The patient is not nervous/anxious.    Past Medical History  Diagnosis Date  . Cardiomyopathy   . Hypertension   . Sleep apnea   . Allergy   . COPD (chronic obstructive pulmonary disease)   . Oxygen deficiency     History   Social History  . Marital Status: Married    Spouse Name: N/A    Number of Children: N/A  . Years of Education: N/A   Occupational History  . Not on file.   Social History Main Topics  . Smoking status: Never Smoker   . Smokeless tobacco: Never Used  . Alcohol Use: No  . Drug Use: No  . Sexual Activity: Not on file   Other Topics Concern  . Not on file   Social History Narrative    Past Surgical History  Procedure Laterality Date  . Hand surgery Bilateral   . Ankle surgery Right   . Cardiac catheterization    . Hand surgery      Right  . Thumb arthroscopy    . Hand surgery      Left carpel tunnel    Family History  Problem Relation Age of Onset  . Heart disease Mother   . Heart disease Father     No Known Allergies  Current Outpatient Prescriptions on File Prior to Visit  Medication Sig Dispense Refill  . amLODipine (NORVASC) 10 MG tablet Take 10 mg by mouth daily.    [name]  aspirin 81 MG tablet Take 81 mg by mouth daily.    . carvedilol (COREG) 25 MG tablet Take 25 mg by mouth 2 (two) times daily with a meal.    . doxazosin (CARDURA) 8 MG tablet Take 8 mg by mouth daily.    . hydrALAZINE (APRESOLINE) 50 MG tablet Take 50 mg by mouth 3 (three) times daily.    . hydrocortisone (ANUSOL-HC) 2.5 % rectal cream Apply rectally 2 times daily 28 g 0  . ISOSORBIDE MONONITRATE PO Take 60 mg by mouth 2 (two) times daily.    . quinapril (ACCUPRIL) 40 MG tablet Take 40 mg by mouth daily.    [name] spironolactone (ALDACTONE) 25 MG tablet Take 25 mg by mouth daily.     No current facility-administered medications on  file prior to visit.    BP 192/109 mmHg  Pulse 85  Temp(Src) 98.3 F (36.8 C) (Oral)  Ht 5' 7.75" (1.721 m)  Wt 204 lb 3.2 oz (92.625 kg)  BMI 31.27 kg/m2  SpO2 98%       Objective:   Physical Exam  General Mental Status- Alert. General Appearance- Not in acute distress.   Skin General: Color- Normal Color. Moisture- Normal Moisture.  Neck Carotid Arteries- Normal color. Moisture- Normal Moisture. No carotid bruits. No JVD.  Chest and Lung Exam Auscultation: Breath Sounds:-Normal.  Cardiovascular Auscultation:Rythm- Regular. Murmurs & Other Heart Sounds:Auscultation of the heart reveals- No Murmurs.   Abdomen Inspection:-Inspection Normal.  Palpation/Perucssion: Palpation and Percussion of the abdomen reveal- Non Tender, No Rebound tenderness, No rigidity(Guarding) and No Palpable abdominal masses.  Liver:-Normal.  Spleen:- Normal.   Rectal Anorectal Exam: Stool -  External - normal external exam. Internal - normal sphincter tone. No rectal mass.    Neurologic Cranial Nerve exam:- CN III-XII intact(No nystagmus), symmetric smile. Drift Test:- No drift. Romberg Exam:- Negative.  Heal to Toe Gait exam:-Normal. Finger to Nose:- Normal/Intact Strength:- 5/5 equal and symmetric strength both upper and lower extremities.      Assessment & Plan:

## 2014-07-28 NOTE — Assessment & Plan Note (Addendum)
Will get cbc today. May refer to hematologist.

## 2014-08-03 ENCOUNTER — Ambulatory Visit (INDEPENDENT_AMBULATORY_CARE_PROVIDER_SITE_OTHER): Payer: Medicare Other | Admitting: Medical

## 2014-08-03 ENCOUNTER — Ambulatory Visit (HOSPITAL_BASED_OUTPATIENT_CLINIC_OR_DEPARTMENT_OTHER)
Admission: RE | Admit: 2014-08-03 | Discharge: 2014-08-03 | Disposition: A | Discharge status: Home / Self Care | Payer: Medicare Other | Source: Ambulatory Visit | Attending: Medical | Admitting: Medical

## 2014-08-03 ENCOUNTER — Encounter: Payer: Self-pay | Admitting: Medical

## 2014-08-03 VITALS — BP 183/91 | HR 78 | Temp 98.5°F | Ht 65.75 in | Wt 204.2 lb

## 2014-08-03 DIAGNOSIS — J438 Other emphysema: Secondary | ICD-10-CM

## 2014-08-03 DIAGNOSIS — I10 Essential (primary) hypertension: Secondary | ICD-10-CM | POA: Diagnosis not present

## 2014-08-03 DIAGNOSIS — I429 Cardiomyopathy, unspecified: Secondary | ICD-10-CM | POA: Diagnosis not present

## 2014-08-03 DIAGNOSIS — J449 Chronic obstructive pulmonary disease, unspecified: Secondary | ICD-10-CM | POA: Diagnosis not present

## 2014-08-03 MED ORDER — HYDROCHLOROTHIAZIDE 50 MG PO TABS: 50.0000 mg | ORAL_TABLET | Freq: Every day | ORAL | Status: DC

## 2014-08-03 NOTE — Progress Notes (Signed)
Pre visit review using our clinic review tool, if applicable. No additional management support is needed unless otherwise documented below in the visit note. 

## 2014-08-03 NOTE — Patient Instructions (Addendum)
HTN (hypertension) I talked with Nurse practioner who I went over list of his meds. She states his last bp was 130/80. Her list is same as my list but no hctz 50 mg po q day. So I want you to start this med in addition to the other meds.  Check your bp daily.  If neuro or cardiac signs or symptoms then ED eval.  Follow up 10 days or as needed.

## 2014-08-03 NOTE — Assessment & Plan Note (Addendum)
I talked with Nurse practioner who I went over list of his meds. She states his last bp was 130/80. Her list is same as my list but no hctz 50 mg po q day. So I want you to start this med in addition to the other meds.  Check your bp daily.  If neuro or cardiac signs or symptoms then ED eval.  Follow up 10 days or as needed.

## 2014-08-03 NOTE — Progress Notes (Signed)
   Subjective:    Patient ID: Matthew Nixon, male    DOB: Aug 31, 1960, 54 y.o.   MRN: [id number]  HPI  Pt in states he feels faint fatigue today and somewhat near his baseline per his report. On and off fatigue. Has known cardiomyopathy. He states this is daily basis and not new.  Pt weight is stable. No significant sob. No chest pain or neurologic signs or symptoms.   Pt blood pressure is always high.  Pt lungs feel little tight always with very cold weather. Since he has had copd.  Major side effects with other meds his cardiologist.   Review of Systems  Constitutional: Positive for fatigue. Negative for fever, chills, diaphoresis and activity change.  Respiratory: Negative for cough, chest tightness and shortness of breath.   Cardiovascular: Negative for chest pain, palpitations and leg swelling.  Gastrointestinal: Negative for nausea, vomiting and abdominal pain.  Musculoskeletal: Negative for neck pain and neck stiffness.  Neurological: Negative for dizziness, tremors, seizures, syncope, facial asymmetry, speech difficulty, weakness, light-headedness, numbness and headaches.  Psychiatric/Behavioral: Negative for behavioral problems, confusion and agitation. The patient is not nervous/anxious.        Objective:   Physical Exam  General Mental Status- Alert. General Appearance- Not in acute distress.   Skin General: Color- Normal Color. Moisture- Normal Moisture.  Neck Carotid Arteries- Normal color. Moisture- Normal Moisture. No carotid bruits. No JVD.  Chest and Lung Exam Auscultation: Breath Sounds:-Normal.  Cardiovascular Auscultation:Rythm- Regular. Murmurs & Other Heart Sounds:Auscultation of the heart reveals- No Murmurs.  Abdomen Inspection:-Inspeection Normal. Palpation/Percussion:Note:No mass. Palpation and Percussion of the abdomen reveal- Non Tender, Non Distended + BS, no rebound or guarding.    Neurologic Cranial Nerve exam:- CN III-XII intact(No  nystagmus), symmetric smile. Drift Test:- No drift. Romberg Exam:- Negative.  Heal to Toe Gait exam:-Normal. Finger to Nose:- Normal/Intact Strength:- 5/5 equal and symmetric strength both upper and lower extremities.   Lower ext- no pedal edema.     Assessment & Plan:

## 2014-08-14 ENCOUNTER — Ambulatory Visit (INDEPENDENT_AMBULATORY_CARE_PROVIDER_SITE_OTHER): Payer: Medicare Other | Admitting: Medical

## 2014-08-14 ENCOUNTER — Encounter: Payer: Self-pay | Admitting: Medical

## 2014-08-14 VITALS — BP 165/95 | HR 95 | Temp 98.2°F | Ht 65.75 in | Wt 204.6 lb

## 2014-08-14 DIAGNOSIS — I10 Essential (primary) hypertension: Secondary | ICD-10-CM | POA: Diagnosis not present

## 2014-08-14 NOTE — Progress Notes (Signed)
Subjective:    Patient ID: Matthew Nixon, male    DOB: 1960-12-12, 54 y.o.   MRN: [id number]  HPI   Pt in stated he is feeling ok. He states nightmares recently related to 911.  Pt states this happens occasionally. Some more frequently. Periodically occurs most often around 9/11. He worked [place] and described that he helped out that day and remembers people falling from building.  Pt is taking the hctz daily now well as amldodipine, carvedilol, cardura, accuplril, aldactone and hydralazine. Hctz was add on in the past visit(He was not taking this as he supposed to). Pt is on various medications in the past. I talked with NP at his cardiologist on lat visit. Pt mentions when summer comes around his bp is always better. No cardiac or neurologist symptoms presently.   Pt bp this am 178/98.       Review of Systems  Constitutional: Negative for fever, chills and fatigue.  Respiratory: Negative for cough, choking, chest tightness, shortness of breath and wheezing.   Cardiovascular: Negative for chest pain and palpitations.  Musculoskeletal: Negative for back pain.  Neurological: Negative for dizziness, tremors, seizures, syncope, facial asymmetry, speech difficulty, weakness, light-headedness, numbness and headaches.  Hematological: Negative for adenopathy. Does not bruise/bleed easily.  Psychiatric/Behavioral: Negative for suicidal ideas, hallucinations, behavioral problems, confusion, sleep disturbance, self-injury, dysphoric mood and decreased concentration. The patient is not nervous/anxious and is not hyperactive.        Some insomiia night mares.   Past Medical History  Diagnosis Date  . Cardiomyopathy   . Hypertension   . Sleep apnea   . Allergy   . COPD (chronic obstructive pulmonary disease)   . Oxygen deficiency     History   Social History  . Marital Status: Married    Spouse Name: N/A  . Number of Children: N/A  . Years of Education: N/A    Occupational History  . Not on file.   Social History Main Topics  . Smoking status: Never Smoker   . Smokeless tobacco: Never Used  . Alcohol Use: No  . Drug Use: No  . Sexual Activity: Not on file   Other Topics Concern  . Not on file   Social History Narrative    Past Surgical History  Procedure Laterality Date  . Hand surgery Bilateral   . Ankle surgery Right   . Cardiac catheterization    . Hand surgery      Right  . Thumb arthroscopy    . Hand surgery      Left carpel tunnel    Family History  Problem Relation Age of Onset  . Heart disease Mother   . Heart disease Father     No Known Allergies  Current Outpatient Prescriptions on File Prior to Visit  Medication Sig Dispense Refill  . amLODipine (NORVASC) 10 MG tablet Take 10 mg by mouth daily.    [name] aspirin 81 MG tablet Take 81 mg by mouth daily.    . carvedilol (COREG) 25 MG tablet Take 25 mg by mouth 2 (two) times daily with a meal.    . doxazosin (CARDURA) 8 MG tablet Take 8 mg by mouth daily.    . hydrALAZINE (APRESOLINE) 50 MG tablet Take 50 mg by mouth 3 (three) times daily.    . hydrochlorothiazide (HYDRODIURIL) 50 MG tablet Take 1 tablet (50 mg total) by mouth daily. 30 tablet 3  . hydrocortisone (ANUSOL-HC) 2.5 % rectal cream Apply rectally 2  times daily 28 g 0  . ISOSORBIDE MONONITRATE PO Take 60 mg by mouth 2 (two) times daily.    . quinapril (ACCUPRIL) 40 MG tablet Take 40 mg by mouth daily.    [name] spironolactone (ALDACTONE) 25 MG tablet Take 25 mg by mouth daily.     No current facility-administered medications on file prior to visit.    BP 165/95 mmHg  Pulse 95  Temp(Src) 98.2 F (36.8 C) (Oral)  Ht 5' 5.75" (1.67 m)  Wt 204 lb 9.6 oz (92.806 kg)  BMI 33.28 kg/m2  SpO2 97%      Objective:   Physical Exam   General Mental Status- Alert. General Appearance- Not in acute distress.   Skin General: Color- Normal Color. Moisture- Normal Moisture.  Neck Carotid Arteries- Normal  color. Moisture- Normal Moisture. No carotid bruits. No JVD.  Chest and Lung Exam Auscultation: Breath Sounds:-Normal.  Cardiovascular Auscultation:Rythm- Regular. Murmurs & Other Heart Sounds:Auscultation of the heart reveals- No Murmurs.  Abdomen Inspection:-Inspeection Normal. Palpation/Percussion:Note:No mass. Palpation and Percussion of the abdomen reveal- Non Tender, Non Distended + BS, no rebound or guarding.    Neurologic Cranial Nerve exam:- CN III-XII intact(No nystagmus), symmetric smile. Drift Test:- No drift. Romberg Exam:- Negative.  Finger to Nose:- Normal/Intact Strength:- 5/5 equal and symmetric strength both upper and lower extremities.      Assessment & Plan:

## 2014-08-14 NOTE — Patient Instructions (Addendum)
HTN (hypertension) Your bp is better but still elevated. If you have cardiac or neurologic symptoms then ED eval.  Continue bp regimen. Consider hydalazine 50 mg three times a day as you had use in the past. This may bring bp down to more acceptable level. If you do this make sure you are not driving in event side effect on the day you add this increase dose(as you described side effect in past when using hydralzine 3 times daily.)  Follow up in one month or as needed.  If you do make the slight  Increase dose of hydralazine mid day please check bp daily and notify me of the results.    Regarding your nightmares and possible ptsd. I am giving you a card of a counselor. You could call or schedule if you like.  Follow up in 3 weeks or as needed.  Note if bp not coming down and  If he stays local/not returning to [place] may get local cardiology consult.

## 2014-08-14 NOTE — Assessment & Plan Note (Addendum)
Your bp is better but still elevated. If you have cardiac or neurologic symptoms then ED eval.  Continue bp regimen. Consider hydalazine 50 mg three times a day as you had use in the past. This may bring bp down to more acceptable level. If you do this make sure you are not driving in event side effect on the day you add this increase dose(as you described side effect in past when using hydralzine 3 times daily.)  Follow up in one month or as needed.  If you do make the slight  Increase dose of hydralazine mid day please check bp daily and notify me of the results.

## 2014-08-14 NOTE — Progress Notes (Signed)
Pre visit review using our clinic review tool, if applicable. No additional management support is needed unless otherwise documented below in the visit note. 

## 2014-08-17 ENCOUNTER — Encounter: Payer: Self-pay | Admitting: Internal Medicine

## 2014-09-12 DIAGNOSIS — I10 Essential (primary) hypertension: Secondary | ICD-10-CM | POA: Diagnosis not present

## 2014-09-12 DIAGNOSIS — Z87891 Personal history of nicotine dependence: Secondary | ICD-10-CM | POA: Diagnosis not present

## 2014-09-12 DIAGNOSIS — Z6830 Body mass index (BMI) 30.0-30.9, adult: Secondary | ICD-10-CM | POA: Diagnosis not present

## 2014-09-12 DIAGNOSIS — G4733 Obstructive sleep apnea (adult) (pediatric): Secondary | ICD-10-CM | POA: Diagnosis not present

## 2014-09-12 DIAGNOSIS — R911 Solitary pulmonary nodule: Secondary | ICD-10-CM | POA: Diagnosis not present

## 2014-09-12 DIAGNOSIS — R591 Generalized enlarged lymph nodes: Secondary | ICD-10-CM | POA: Diagnosis not present

## 2014-10-03 ENCOUNTER — Telehealth: Payer: Self-pay | Admitting: *Deleted

## 2014-10-03 NOTE — Telephone Encounter (Signed)
Reviewing patients chart, patient indicated to other care providers that he has been told he has a low EF. Phone number for cardiologist in [place] [phone] spoke with Precious Bard and requested Echo from 2014. Apparently report has not been signed and MD not in office until 10/05/14. Report will be signed by Dr. Candiss Norse 10/05/14 and faxed to our 3rd floor office fax machine number [phone].

## 2014-10-06 ENCOUNTER — Telehealth: Payer: Self-pay | Admitting: *Deleted

## 2014-10-06 NOTE — Telephone Encounter (Signed)
Pt in for pre visit today. We have not received old echo report from [facility] at this time.  Pt states he sees his cardiologist in [place]. He states he knows that his Ef is < 35 but unsure of exact number, he has a dx of cardio myopathy. He is scheduled to see Dr Candiss Norse cardio in [place] next month. He states he wishes to cancel his colon until after the appointment with this cardiologist . He will get latest echo report at that visit next month. Explained to Pt that he will probably  need an OV after he returns either with Dr Henrene Pastor or APP.  Instructed pt on criteria where his colon here would have to be cancelled and /or Rs to [facility]. He verbalized understanding and gratitude that we are so thorough and careful here due to his condition. Pt states after his appt next month he will call and either Rs his colon or schedule OV depending on his EF results.  Instructed pt to call with questions or concerns.   Lelan Pons PV

## 2014-10-20 ENCOUNTER — Encounter: Payer: Medicare Other | Admitting: Internal Medicine

## 2014-11-13 DIAGNOSIS — I10 Essential (primary) hypertension: Secondary | ICD-10-CM | POA: Diagnosis not present

## 2014-11-13 DIAGNOSIS — I340 Nonrheumatic mitral (valve) insufficiency: Secondary | ICD-10-CM | POA: Diagnosis not present

## 2014-11-13 DIAGNOSIS — I5021 Acute systolic (congestive) heart failure: Secondary | ICD-10-CM | POA: Diagnosis not present

## 2014-11-23 DIAGNOSIS — I5022 Chronic systolic (congestive) heart failure: Secondary | ICD-10-CM | POA: Diagnosis not present

## 2014-11-27 ENCOUNTER — Telehealth: Payer: Self-pay | Admitting: Internal Medicine

## 2014-11-27 NOTE — Telephone Encounter (Signed)
Pt brought in a doctors note from his cardiologist that's gives him clearance for his colon but we still have no echo report. Called DrSingh's office in [place] and they are asking for a records release to send. Pt called and instructed he needs to come to 4 th floor [facility] and sign records release to be sent to cardio office, instructed pt to come Thursday or Friday this week if possible. Pt verbalized understanding of this and states will come and sign.  Instructed pt after we receive this report will get with Dr Henrene Pastor to see if needs ov or can be a direct hosp colon. Lelan Pons PV

## 2014-12-18 DIAGNOSIS — Z565 Uncongenial work environment: Secondary | ICD-10-CM | POA: Diagnosis not present

## 2014-12-18 DIAGNOSIS — D751 Secondary polycythemia: Secondary | ICD-10-CM | POA: Diagnosis not present

## 2014-12-18 DIAGNOSIS — J309 Allergic rhinitis, unspecified: Secondary | ICD-10-CM | POA: Diagnosis not present

## 2014-12-18 DIAGNOSIS — R911 Solitary pulmonary nodule: Secondary | ICD-10-CM | POA: Diagnosis not present

## 2014-12-18 DIAGNOSIS — J449 Chronic obstructive pulmonary disease, unspecified: Secondary | ICD-10-CM | POA: Diagnosis not present

## 2014-12-18 DIAGNOSIS — G4730 Sleep apnea, unspecified: Secondary | ICD-10-CM | POA: Diagnosis not present

## 2015-01-03 DIAGNOSIS — M7652 Patellar tendinitis, left knee: Secondary | ICD-10-CM | POA: Diagnosis not present

## 2015-01-03 DIAGNOSIS — M7650 Patellar tendinitis, unspecified knee: Secondary | ICD-10-CM | POA: Insufficient documentation

## 2015-01-16 ENCOUNTER — Ambulatory Visit: Payer: Self-pay

## 2015-01-16 ENCOUNTER — Other Ambulatory Visit: Payer: Self-pay | Admitting: Occupational Medicine

## 2015-01-16 DIAGNOSIS — Z0000 Encounter for general adult medical examination without abnormal findings: Secondary | ICD-10-CM

## 2015-02-19 DIAGNOSIS — E780 Pure hypercholesterolemia: Secondary | ICD-10-CM | POA: Diagnosis not present

## 2015-02-19 DIAGNOSIS — I2510 Atherosclerotic heart disease of native coronary artery without angina pectoris: Secondary | ICD-10-CM | POA: Diagnosis not present

## 2015-02-19 DIAGNOSIS — I10 Essential (primary) hypertension: Secondary | ICD-10-CM | POA: Diagnosis not present

## 2015-02-19 DIAGNOSIS — I5022 Chronic systolic (congestive) heart failure: Secondary | ICD-10-CM | POA: Diagnosis not present

## 2015-04-20 DIAGNOSIS — J479 Bronchiectasis, uncomplicated: Secondary | ICD-10-CM | POA: Diagnosis not present

## 2015-04-20 DIAGNOSIS — J449 Chronic obstructive pulmonary disease, unspecified: Secondary | ICD-10-CM | POA: Diagnosis not present

## 2015-04-20 DIAGNOSIS — Z79899 Other long term (current) drug therapy: Secondary | ICD-10-CM | POA: Diagnosis not present

## 2015-04-20 DIAGNOSIS — I159 Secondary hypertension, unspecified: Secondary | ICD-10-CM | POA: Diagnosis not present

## 2015-04-20 DIAGNOSIS — D751 Secondary polycythemia: Secondary | ICD-10-CM | POA: Diagnosis not present

## 2015-04-20 DIAGNOSIS — G4733 Obstructive sleep apnea (adult) (pediatric): Secondary | ICD-10-CM | POA: Diagnosis not present

## 2015-04-25 ENCOUNTER — Encounter: Payer: Self-pay | Admitting: Medical

## 2015-04-25 ENCOUNTER — Ambulatory Visit (INDEPENDENT_AMBULATORY_CARE_PROVIDER_SITE_OTHER): Payer: Medicare Other | Admitting: Medical

## 2015-04-25 VITALS — BP 142/90 | HR 71 | Temp 97.5°F | Ht 65.75 in | Wt 199.4 lb

## 2015-04-25 DIAGNOSIS — R519 Headache, unspecified: Secondary | ICD-10-CM

## 2015-04-25 DIAGNOSIS — R51 Headache: Secondary | ICD-10-CM

## 2015-04-25 LAB — SEDIMENTATION RATE: Sed Rate: 4 mm/hr (ref 0–22)

## 2015-04-25 NOTE — Patient Instructions (Addendum)
For your headache, I offered toradol IM in office but you declined.  Will get sed rate today stat.   Will try to get you in with neurologist by tomorrow or thursday.  If I can't get you in quickly with neurologist may get ct of your head stat today or tomorrow depending on how you are.  After sed rate back may rx course of prednisone.  If you ha worsens or neurologic symptoms associated then ED evaluation. Particularly any vision changes.  Will follow sed rate results, try to get in with neuro and call pt to see how he is later today.

## 2015-04-25 NOTE — Progress Notes (Signed)
Pre visit review using our clinic review tool, if applicable. No additional management support is needed unless otherwise documented below in the visit note. 

## 2015-04-25 NOTE — Progress Notes (Signed)
Subjective:    Patient ID: Matthew Nixon, male    DOB: 1961-05-23, 54 y.o.   MRN: [id number]  HPI  Pt in for one month of a headache. Pt states pain in left temporal area. Pt states it came on after vacation. He had some mild nasal congestion around time of vacation. But then he clarifies he had headache before.   Pt states level 9/10 pain(but this is level of ha for entire month). But most of past month was in [place] He did not want to be evaluated there.  Pt has history of htn. And cardiomyohathy. His bp is relativley controlled today compared to much higher  prior readings when I initially met pt.    Review of Systems  Constitutional: Negative for fever, chills and fatigue.  Musculoskeletal: Negative for back pain.  Neurological: Positive for headaches. Negative for dizziness, tremors, seizures, syncope, speech difficulty, weakness, light-headedness and numbness.  Hematological: Negative for adenopathy. Does not bruise/bleed easily.  Psychiatric/Behavioral: Negative for suicidal ideas, hallucinations, behavioral problems and dysphoric mood. The patient is not nervous/anxious.     Past Medical History  Diagnosis Date  . Cardiomyopathy ([place])   . Hypertension   . Sleep apnea   . Allergy   . COPD (chronic obstructive pulmonary disease) ([place])   . Oxygen deficiency     Social History   Social History  . Marital Status: Married    Spouse Name: N/A  . Number of Children: N/A  . Years of Education: N/A   Occupational History  . Not on file.   Social History Main Topics  . Smoking status: Never Smoker   . Smokeless tobacco: Never Used  . Alcohol Use: No  . Drug Use: No  . Sexual Activity: Not on file   Other Topics Concern  . Not on file   Social History Narrative    Past Surgical History  Procedure Laterality Date  . Hand surgery Bilateral   . Ankle surgery Right   . Cardiac catheterization    . Hand surgery      Right  . Thumb arthroscopy      . Hand surgery      Left carpel tunnel    Family History  Problem Relation Age of Onset  . Heart disease Mother   . Heart disease Father     No Known Allergies  Current Outpatient Prescriptions on File Prior to Visit  Medication Sig Dispense Refill  . amLODipine (NORVASC) 10 MG tablet Take 10 mg by mouth daily.    [name] aspirin 81 MG tablet Take 81 mg by mouth daily.    . carvedilol (COREG) 25 MG tablet Take 25 mg by mouth 2 (two) times daily with a meal.    . doxazosin (CARDURA) 8 MG tablet Take 8 mg by mouth daily.    . hydrALAZINE (APRESOLINE) 50 MG tablet Take 50 mg by mouth 3 (three) times daily.    . hydrochlorothiazide (HYDRODIURIL) 50 MG tablet Take 1 tablet (50 mg total) by mouth daily. 30 tablet 3  . hydrocortisone (ANUSOL-HC) 2.5 % rectal cream Apply rectally 2 times daily 28 g 0  . ISOSORBIDE MONONITRATE PO Take 60 mg by mouth 2 (two) times daily.    . quinapril (ACCUPRIL) 40 MG tablet Take 40 mg by mouth daily.    [name] spironolactone (ALDACTONE) 25 MG tablet Take 25 mg by mouth daily.     No current facility-administered medications on file prior to visit.    BP 142/90  mmHg  Pulse 71  Temp(Src) 97.5 F (36.4 C) (Oral)  Ht 5' 5.75" (1.67 m)  Wt 199 lb 6.4 oz (90.447 kg)  BMI 32.43 kg/m2  SpO2 98%       Objective:   Physical Exam  General Mental Status- Alert. General Appearance- Not in acute distress.   Skin General: Color- Normal Color. Moisture- Normal Moisture.  Neck Carotid Arteries- Normal color. Moisture- Normal Moisture. No carotid bruits. No JVD.  Chest and Lung Exam Auscultation: Breath Sounds:-Normal.  Cardiovascular Auscultation:Rythm- Regular. Murmurs & Other Heart Sounds:Auscultation of the heart reveals- No Murmurs.  Abdomen Inspection:-Inspeection Normal. Palpation/Percussion:Note:No mass. Palpation and Percussion of the abdomen reveal- Non Tender, Non Distended + BS, no rebound or guarding.    Neurologic Cranial Nerve exam:- CN  III-XII intact(No nystagmus), symmetric smile. Drift Test:- No drift. Romberg Exam:- Negative.  Heal to Toe Gait exam:-Normal. Finger to Nose:- Normal/Intact Strength:- 5/5 equal and symmetric strength both upper. Rt lower ext strength 3/5 compared to left side.(He explains current strength change not new. He had work accident and subsequent surgery. His leg has been weak since)  Skin left temporal area- mild tender to palpaton. NO superficial temporal artery seen. But on palpation I think can feel vessel and tender.      Assessment & Plan:  [phone] For your headache, I offered toradol IM in office but you declined.  Will get sed rate today stat.   Will try to get you in with neurologist by tomorrow or thursday.  If I can't get you in quickly with neurologist may get ct of your head stat today or tomorrow depending on how you are.  After sed rate back may rx course of prednisone.  If you ha worsens or neurologic symptoms associated then ED evaluation. Particularly any vision changes.

## 2015-04-26 ENCOUNTER — Telehealth: Payer: Self-pay | Admitting: Medical

## 2015-04-26 DIAGNOSIS — R51 Headache: Principal | ICD-10-CM

## 2015-04-26 DIAGNOSIS — R519 Headache, unspecified: Secondary | ICD-10-CM

## 2015-04-26 NOTE — Telephone Encounter (Signed)
Caller name:Drevin Relationship to patient:self Can be reached:[phone] Pharmacy:  Reason for call:patient is returning your call

## 2015-04-26 NOTE — Telephone Encounter (Signed)
Matthew Nixon or Matthew Nixon. Would you see if pt can get ct of head stat tomorrow. I put in order. Would like him to have test done before the weekend if possible. Will you update me tomorrow. Thanks.

## 2015-04-26 NOTE — Telephone Encounter (Signed)
Spoke with pt and he voices understanding.

## 2015-04-30 ENCOUNTER — Telehealth: Payer: Self-pay | Admitting: Medical

## 2015-04-30 ENCOUNTER — Ambulatory Visit (HOSPITAL_BASED_OUTPATIENT_CLINIC_OR_DEPARTMENT_OTHER)
Admission: RE | Admit: 2015-04-30 | Discharge: 2015-04-30 | Disposition: A | Discharge status: Home / Self Care | Payer: Medicare Other | Source: Ambulatory Visit | Attending: Medical | Admitting: Medical

## 2015-04-30 DIAGNOSIS — G319 Degenerative disease of nervous system, unspecified: Secondary | ICD-10-CM | POA: Insufficient documentation

## 2015-04-30 DIAGNOSIS — R519 Headache, unspecified: Secondary | ICD-10-CM

## 2015-04-30 DIAGNOSIS — R51 Headache: Secondary | ICD-10-CM | POA: Insufficient documentation

## 2015-04-30 NOTE — Telephone Encounter (Signed)
Pt is still having some pain and the neurology appt until 06/04/15. He wants to know what you can advise to get him in sooner. Please advise.

## 2015-04-30 NOTE — Telephone Encounter (Signed)
Caller name: [facility]   Can be reached: [phone]  Reason for call: nothing abnormal on pt's CT. She will have pt go home and inform that provider will fu with results.

## 2015-04-30 NOTE — Telephone Encounter (Signed)
Call pt and see how bad his ha is on scale of 1/10. If ha severe and neurologic signs and symptoms then ED evaluation he may need mri even though ct of head was negative. If same level pain  could try to get referral staff to try other neurologist in area. But keep current appointment since may not be able to get him in sooner. I need to know level of ha first before call around to try to get him in sooner. Also need to know if he is having any vision symptom?

## 2015-04-30 NOTE — Telephone Encounter (Signed)
Pt is returning your call.    CB#: [phone]

## 2015-04-30 NOTE — Telephone Encounter (Signed)
I put in order for CT of the head. It has not been done yet. Apparently he can get done just needs to be contacted. We have tried. Also I have referral to neurologist out. Would you check on that?

## 2015-05-01 MED ORDER — CYCLOBENZAPRINE HCL 10 MG PO TABS: 10.0000 mg | ORAL_TABLET | Freq: Every day | ORAL | Status: DC

## 2015-05-01 NOTE — Addendum Note (Signed)
Addended by: Tasia Catchings on: 05/01/2015 08:53 AM   Modules accepted: Orders

## 2015-05-01 NOTE — Telephone Encounter (Signed)
Pt is willing to try muscle relaxer for the neck pain and he would like to go to the eye doctor as well to see if anything may be going on there. Pt still states that his headache is 8/10 and he denies any vision problems. Pt does not want MRI due to CT scan not finding anything. Per Percell Miller we will work on getting this pt in quicker if possible.

## 2015-05-02 ENCOUNTER — Telehealth: Payer: Self-pay | Admitting: Medical

## 2015-05-02 DIAGNOSIS — R519 Headache, unspecified: Secondary | ICD-10-CM

## 2015-05-02 DIAGNOSIS — H5712 Ocular pain, left eye: Secondary | ICD-10-CM

## 2015-05-02 DIAGNOSIS — R51 Headache: Secondary | ICD-10-CM

## 2015-05-02 NOTE — Telephone Encounter (Signed)
Left message for pt to call back  °

## 2015-05-02 NOTE — Telephone Encounter (Signed)
Please advise 

## 2015-05-02 NOTE — Telephone Encounter (Signed)
Will you let pt know that will try to get him in with optometrist or ophthalmologist. It may take some time to schedule with opthalmologist so would  Try optometrist first. He may need pressure of eye measured.  Also his ct of head was negative but mri can show things ct does not. In fact neurologist often do order even when ct negative/already done. Since ha still persists and neurology appointment delayed mri may be beneficial.  Let Anderson Malta or Pleas Koch know if pt agrees to [employer]. Pleas Koch is here to 4:30. Jennifer leaves ealier.

## 2015-05-02 NOTE — Telephone Encounter (Signed)
Pt was made aware of the appointment tomorrow morning and he is agreeable. Pt does not want to continue with the MRI. I made him aware that of note below per ES suggestions and he is not willing.

## 2015-05-03 ENCOUNTER — Telehealth: Payer: Self-pay | Admitting: Medical

## 2015-05-03 DIAGNOSIS — Z8679 Personal history of other diseases of the circulatory system: Secondary | ICD-10-CM | POA: Diagnosis not present

## 2015-05-03 DIAGNOSIS — H25013 Cortical age-related cataract, bilateral: Secondary | ICD-10-CM | POA: Diagnosis not present

## 2015-05-03 DIAGNOSIS — H35372 Puckering of macula, left eye: Secondary | ICD-10-CM | POA: Diagnosis not present

## 2015-05-03 DIAGNOSIS — H33312 Horseshoe tear of retina without detachment, left eye: Secondary | ICD-10-CM | POA: Diagnosis not present

## 2015-05-03 DIAGNOSIS — H31002 Unspecified chorioretinal scars, left eye: Secondary | ICD-10-CM | POA: Diagnosis not present

## 2015-05-03 DIAGNOSIS — I776 Arteritis, unspecified: Secondary | ICD-10-CM

## 2015-05-03 NOTE — Telephone Encounter (Signed)
Pt was advised he may need oral prednisone for treatment of temporal artery pain. Pt is going to call his cardiologist to see if they would approve use of prednisone. Pt does not want to try due to hx of cardiomyopathy.

## 2015-05-04 ENCOUNTER — Telehealth: Payer: Self-pay | Admitting: Medical

## 2015-05-04 DIAGNOSIS — H35372 Puckering of macula, left eye: Secondary | ICD-10-CM | POA: Diagnosis not present

## 2015-05-04 DIAGNOSIS — H25813 Combined forms of age-related cataract, bilateral: Secondary | ICD-10-CM | POA: Diagnosis not present

## 2015-05-04 DIAGNOSIS — H33322 Round hole, left eye: Secondary | ICD-10-CM | POA: Diagnosis not present

## 2015-05-04 DIAGNOSIS — H33312 Horseshoe tear of retina without detachment, left eye: Secondary | ICD-10-CM | POA: Diagnosis not present

## 2015-05-04 DIAGNOSIS — H31012 Macula scars of posterior pole (postinflammatory) (post-traumatic), left eye: Secondary | ICD-10-CM | POA: Diagnosis not present

## 2015-05-04 NOTE — Telephone Encounter (Signed)
Pt in states pain around his eye is a lot less. Now pain 6/10. Was around 9/10. He states inflamed area around temporal area is better. He went to Dr. Posey Pronto another ophthalmologist. He states he was told to worry per pt. I explained that Dr. Kathlen Mody at optholmologist office thought he needed temporal artery biopsy. Pt declines this now. I explained if he changes he is mind we could reactivate that referral. Pt aware if pain worsens or any vision changes then ED evaluation. He states he will go if condition worsens. Pt does not want to consider any steroid use presently based on Dr. Posey Pronto reassurance yesterday. Pt will keep appointment with neurologist that our office made for him

## 2015-05-28 DIAGNOSIS — I509 Heart failure, unspecified: Secondary | ICD-10-CM | POA: Diagnosis not present

## 2015-05-28 DIAGNOSIS — I340 Nonrheumatic mitral (valve) insufficiency: Secondary | ICD-10-CM | POA: Diagnosis not present

## 2015-05-28 DIAGNOSIS — I10 Essential (primary) hypertension: Secondary | ICD-10-CM | POA: Diagnosis not present

## 2015-05-28 DIAGNOSIS — I5022 Chronic systolic (congestive) heart failure: Secondary | ICD-10-CM | POA: Diagnosis not present

## 2015-06-04 ENCOUNTER — Encounter: Payer: Self-pay | Admitting: Neurology

## 2015-06-04 ENCOUNTER — Ambulatory Visit (INDEPENDENT_AMBULATORY_CARE_PROVIDER_SITE_OTHER): Payer: Medicare Other | Admitting: Neurology

## 2015-06-04 VITALS — BP 144/86 | HR 89 | Ht 69.0 in | Wt 202.0 lb

## 2015-06-04 DIAGNOSIS — G4452 New daily persistent headache (NDPH): Secondary | ICD-10-CM

## 2015-06-04 MED ORDER — CYCLOBENZAPRINE HCL 10 MG PO TABS
10.0000 mg | ORAL_TABLET | Freq: Three times a day (TID) | ORAL | Status: DC | PRN
Start: 2015-06-04 — End: 2015-09-03

## 2015-06-04 NOTE — Patient Instructions (Addendum)
1. Since you do not do well with medication, I will refer you to Dr. Hulan Saas, who performs neck exercises, which I feel would be helpful for your headache 2.  Follow up in 3 months.

## 2015-06-04 NOTE — Progress Notes (Signed)
NEUROLOGY CONSULTATION NOTE  Matthew Nixon MRN: [id number] DOB: 1960-09-09  Referring provider: Evern Core, PA-C Primary care provider: Evern Core, PA-C  Reason for consult:  headache  HISTORY OF PRESENT ILLNESS: Matthew Nixon is a 54 year old ambidextrous male with hypertension, cardiomyopathy, COPD, back problems and bilateral hand and carpal tunnel surgery, and sleep apnea who presents for headache.  History obtained by patient and PCP note.  Labs images of head CT reviewed.  Beginning in early October, he began experiencing left sided nonthrobbing headache.  It was usually 6.5-7/10 intensity but can be as high as 8/10.  There are no associated symptoms, such as nausea, photophobia, phonophobia, visual disturbance or autonomic symptoms.  He does note nasal congestion.  It is constant.  He does not do well with medications and will only take a Tylenol occasionally.  He does report tightness in the suboccipital region and neck.  He denies numbness or tingling in back of head or hands.    Current abortive medication:  Tylenol (occasionally) Antihypertensive medications:  quinapril, spironolactone, carvedilol, amlodipine, HCTZ Antidepressant medications:  none Anticonvulsant medications:  none Other medication:  Isosorbide, digoxin  He denies prior history of headache.  He reports no head injury, new medication or new environment just prior to onset of headaches.  However, he was exposed while working at [employer] following the 9/11 attacks.  He had a CT of the head on 04/30/15, which was unremarkable except for some mild diffuse atrophy.  Sed Rate 4.  CBC and CMP also were normal.  PAST MEDICAL HISTORY: Past Medical History  Diagnosis Date  . Cardiomyopathy ([place])   . Hypertension   . Sleep apnea   . Allergy   . COPD (chronic obstructive pulmonary disease) ([place])   . Oxygen deficiency     PAST SURGICAL HISTORY: Past Surgical History  Procedure Laterality Date  .  Hand surgery Bilateral   . Ankle surgery Right   . Cardiac catheterization    . Hand surgery      Right  . Thumb arthroscopy    . Hand surgery      Left carpel tunnel    MEDICATIONS: Current Outpatient Prescriptions on File Prior to Visit  Medication Sig Dispense Refill  . amLODipine (NORVASC) 10 MG tablet Take 10 mg by mouth daily.    [name] aspirin 81 MG tablet Take 81 mg by mouth daily.    . carvedilol (COREG) 25 MG tablet Take 25 mg by mouth 2 (two) times daily with a meal.    . hydrochlorothiazide (HYDRODIURIL) 50 MG tablet Take 1 tablet (50 mg total) by mouth daily. 30 tablet 3  . quinapril (ACCUPRIL) 40 MG tablet Take 40 mg by mouth daily.    [name] doxazosin (CARDURA) 8 MG tablet Take 8 mg by mouth daily.    . hydrALAZINE (APRESOLINE) 50 MG tablet Take 50 mg by mouth 3 (three) times daily.    . ISOSORBIDE MONONITRATE PO Take 60 mg by mouth 2 (two) times daily.    [name] spironolactone (ALDACTONE) 25 MG tablet Take 25 mg by mouth daily.     No current facility-administered medications on file prior to visit.    ALLERGIES: No Known Allergies  FAMILY HISTORY: Family History  Problem Relation Age of Onset  . Heart disease Mother   . Heart disease Father     SOCIAL HISTORY: Social History   Social History  . Marital Status: Married    Spouse Name: N/A  . Number of Children:  N/A  . Years of Education: N/A   Occupational History  . Not on file.   Social History Main Topics  . Smoking status: Never Smoker   . Smokeless tobacco: Never Used  . Alcohol Use: No  . Drug Use: No  . Sexual Activity: Not on file   Other Topics Concern  . Not on file   Social History Narrative   2 years college     REVIEW OF SYSTEMS: Constitutional: No fevers, chills, or sweats, no generalized fatigue, change in appetite Eyes: No visual changes, double vision, eye pain Ear, nose and throat: No hearing loss, ear pain, nasal congestion, sore throat Cardiovascular: No chest pain,  palpitations Respiratory:  No shortness of breath at rest or with exertion, wheezes GastrointestinaI: No nausea, vomiting, diarrhea, abdominal pain, fecal incontinence Genitourinary:  No dysuria, urinary retention or frequency Musculoskeletal:  back pain Integumentary: No rash, pruritus, skin lesions Neurological: as above Psychiatric: No depression, insomnia, anxiety Endocrine: No palpitations, fatigue, diaphoresis, mood swings, change in appetite, change in weight, increased thirst Hematologic/Lymphatic:  No anemia, purpura, petechiae. Allergic/Immunologic: no itchy/runny eyes, nasal congestion, recent allergic reactions, rashes  PHYSICAL EXAM: Filed Vitals:   06/04/15 0806  BP: 144/86  Pulse: 89   General: No acute distress.  Patient appears well-groomed.  Head:  Normocephalic/atraumatic Eyes:  fundi unremarkable, without vessel changes, exudates, hemorrhages or papilledema. Neck: supple, bilateral neck tenderness and left greater than right suboccipital tenderness, full range of motion Back: No paraspinal tenderness Heart: regular rate and rhythm Lungs: Clear to auscultation bilaterally. Vascular: No carotid bruits. Neurological Exam: Mental status: alert and oriented to person, place, and time, recent and remote memory intact, fund of knowledge intact, attention and concentration intact, speech fluent and not dysarthric, language intact. Cranial nerves: CN I: not tested CN II: pupils equal, round and reactive to light, visual fields intact, fundi unremarkable, without vessel changes, exudates, hemorrhages or papilledema. CN III, IV, VI:  full range of motion, no nystagmus, no ptosis CN V: facial sensation intact CN VII: upper and lower face symmetric CN VIII: hearing intact CN IX, X: gag intact, uvula midline CN XI: sternocleidomastoid and trapezius muscles intact CN XII: tongue midline Bulk & Tone: normal, no fasciculations. Motor:  5/5 throughout  Sensation:  Decreased  pinprick sensation in right foot and variable fingers in both hands.  Reduced vibration sensation in right foot. Deep Tendon Reflexes:  2+ throughout, toes downgoing.  Finger to nose testing:  Without dysmetria.  Heel to shin:  Without dysmetria.  Gait:  Normal station and stride.  Able to turn and tandem walk. Romberg negative.  IMPRESSION: New daily persistent headache, possibly tension related HTN  PLAN: He prefers not to start a medication since he already takes multiple medications and often does not do well with medications.  I cannot prescribe a prednisone taper due to prior side effects.  He does exhibit muscle tension and tenderness to palpation of the left suboccipital region and both sides of neck.  He will try OMT with Dr. Tamala Julian first, and see if this is helpful. He will follow up in 3 months and discuss other treatment if needed.  BP is elevated today.  Should be rechecked with PCP.  Thank you for allowing me to take part in the care of this patient.  Metta Clines, DO  CC:  Mackie Pai, PA-C

## 2015-07-16 ENCOUNTER — Encounter: Payer: Self-pay | Admitting: Medical

## 2015-07-16 ENCOUNTER — Ambulatory Visit (INDEPENDENT_AMBULATORY_CARE_PROVIDER_SITE_OTHER): Payer: Medicare Other | Admitting: Medical

## 2015-07-16 VITALS — BP 142/92 | HR 78 | Temp 98.1°F | Ht 69.0 in | Wt 206.4 lb

## 2015-07-16 DIAGNOSIS — J0101 Acute recurrent maxillary sinusitis: Secondary | ICD-10-CM | POA: Diagnosis not present

## 2015-07-16 DIAGNOSIS — R51 Headache: Secondary | ICD-10-CM | POA: Diagnosis not present

## 2015-07-16 DIAGNOSIS — R519 Headache, unspecified: Secondary | ICD-10-CM

## 2015-07-16 LAB — SEDIMENTATION RATE: Sed Rate: 2 mm/hr (ref 0–22)

## 2015-07-16 MED ORDER — CEFDINIR 300 MG PO CAPS: 300.0000 mg | ORAL_CAPSULE | Freq: Two times a day (BID) | ORAL | Status: DC

## 2015-07-16 NOTE — Progress Notes (Signed)
Subjective:    Patient ID: Matthew Nixon, male    DOB: 15-Feb-1961, 55 y.o.   MRN: [id number]  HPI   Pt in for follow up. Pt still has occasional pain in his left temporal area(occurs on daily basis and is constant at time). Sometimes he feels like maybe his maxillary sinus region hurts. He speculates that may be sinus infection or tooth infection.  Pt states his left eye was examined by Opthalmology in the past. Pt declined use of steroid for possible temporal arteritis. I referred pt to [facility] surgeon and some messages were left on his machine. Appears her did talk with Dr. Redmond Baseman office per chart review.. Dr. Posey Pronto ophthalmologist did advise pt he did not think he had any visual problems. No mention of getting temporal artery biospy per pt.  Pt had negative ct scan in past ordered by myself. Pt seen by Dr. Tomi Likens who though maybe tension related HA.   Pt speculate maybe tooth issue. But no pain in upper teeth. Occaisonal mild bleed of the gums.  Pt pain level is level 7/10 at times. No vision changes on that side.  Pt is taking tylenol 1-2 tab a day.    Review of Systems  Constitutional: Negative for fever, chills, diaphoresis, activity change and fatigue.  Respiratory: Negative for cough, chest tightness and shortness of breath.   Cardiovascular: Negative for chest pain, palpitations and leg swelling.  Gastrointestinal: Negative for nausea, vomiting and abdominal pain.  Musculoskeletal: Negative for neck pain and neck stiffness.  Neurological: Positive for headaches. Negative for dizziness, syncope, facial asymmetry, speech difficulty, weakness and light-headedness.       Pain left temporal region pain.  Hematological: Negative for adenopathy. Does not bruise/bleed easily.  Psychiatric/Behavioral: Negative for behavioral problems, confusion and agitation. The patient is not nervous/anxious.     Past Medical History  Diagnosis Date  . Cardiomyopathy ([place])   . Hypertension   .  Sleep apnea   . Allergy   . COPD (chronic obstructive pulmonary disease) ([place])   . Oxygen deficiency     Social History   Social History  . Marital Status: Married    Spouse Name: N/A  . Number of Children: N/A  . Years of Education: N/A   Occupational History  . Not on file.   Social History Main Topics  . Smoking status: Never Smoker   . Smokeless tobacco: Never Used  . Alcohol Use: No  . Drug Use: No  . Sexual Activity: Not on file   Other Topics Concern  . Not on file   Social History Narrative   2 years college     Past Surgical History  Procedure Laterality Date  . Hand surgery Bilateral   . Ankle surgery Right   . Cardiac catheterization    . Hand surgery      Right  . Thumb arthroscopy    . Hand surgery      Left carpel tunnel    Family History  Problem Relation Age of Onset  . Heart disease Mother   . Heart disease Father     No Known Allergies  Current Outpatient Prescriptions on File Prior to Visit  Medication Sig Dispense Refill  . amLODipine (NORVASC) 10 MG tablet Take 10 mg by mouth daily.    [name] aspirin 81 MG tablet Take 81 mg by mouth daily.    . carvedilol (COREG) 25 MG tablet Take 25 mg by mouth 2 (two) times daily with a meal.    .  cyclobenzaprine (FLEXERIL) 10 MG tablet Take 1 tablet (10 mg total) by mouth 3 (three) times daily as needed for muscle spasms. 30 tablet 3  . digoxin (LANOXIN) 0.125 MG tablet Take by mouth daily.    [name] doxazosin (CARDURA) 8 MG tablet Take 8 mg by mouth daily.    . hydrALAZINE (APRESOLINE) 50 MG tablet Take 50 mg by mouth 3 (three) times daily.    . hydrochlorothiazide (HYDRODIURIL) 50 MG tablet Take 1 tablet (50 mg total) by mouth daily. 30 tablet 3  . ISOSORBIDE MONONITRATE PO Take 60 mg by mouth 2 (two) times daily.    . mometasone-formoterol (DULERA) 200-5 MCG/ACT AERO Inhale 2 puffs into the lungs 2 (two) times daily.    . quinapril (ACCUPRIL) 40 MG tablet Take 40 mg by mouth daily.    [name] spironolactone  (ALDACTONE) 25 MG tablet Take 25 mg by mouth daily.     No current facility-administered medications on file prior to visit.    BP 142/92 mmHg  Pulse 78  Temp(Src) 98.1 F (36.7 C) (Oral)  Ht '5\' 9"'$  (1.753 m)  Wt 206 lb 6.4 oz (93.622 kg)  BMI 30.47 kg/m2  SpO2 96%       Objective:   Physical Exam  General Mental Status- Alert. General Appearance- Not in acute distress.   Skin General: Color- Normal Color. Moisture- Normal Moisture.  Left temporal area- mild tender area to palpation directly. small palpable pain in this region.No obvious dilation presently. [name] HEENT Head- Normal. Ear Auditory Canal - Left- Normal. Right - Normal.Tympanic Membrane- Left- Normal. Right- Normal. Eye Sclera/Conjunctiva- Left- Normal. Right- Normal. Nose & Sinuses Nasal Mucosa- Left-   Not Boggy or  Congested. Right-  Not Boggy or  Congested.Bilateral maxillary and frontal sinus pressure.(more maxillary) Mouth & Throat Lips: Upper Lip- Normal: no dryness, cracking, pallor, cyanosis, or vesicular eruption. Lower Lip-Normal: no dryness, cracking, pallor, cyanosis or vesicular eruption. Buccal Mucosa- Bilateral- No Aphthous ulcers. Oropharynx- No Discharge or Erythema. Tonsils: Characteristics- Bilateral- No Erythema or Congestion. Size/Enlargement- Bilateral- No enlargement. Discharge- bilateral-None.   Neck Carotid Arteries- Normal color. Moisture- Normal Moisture. No carotid bruits. No JVD.  Chest and Lung Exam Auscultation: Breath Sounds:-Normal.  Cardiovascular Auscultation:Rythm- Regular. Murmurs & Other Heart Sounds:Auscultation of the heart reveals- No Murmurs.  Abdomen Inspection:-Inspeection Normal. Palpation/Percussion:Note:No mass. Palpation and Percussion of the abdomen reveal- Non Tender, Non Distended + BS, no rebound or guarding.    Neurologic Cranial Nerve exam:- CN III-XII intact(No nystagmus), symmetric smile. EOM intact. Strength:- 5/5 equal and symmetric  strength both upper and lower extremities.     Assessment & Plan:  Will get sed rate today.   Recommend using tylenol for pain and can alternate with lose dose ibuprofen.  I am trying to get records from Dr. Bates(ENT)(their office states he never saw them) and Dr. Posey Pronto office. Will review there opinion on getting temporal artery biopsy.  You do have some sinus area pain and will prescribe cefdnir since you do have some sinus pressure.  Pt still expresses understanding that if any acute visual changes with above then he is to go to ED.  Follow up 1 week or as needed

## 2015-07-16 NOTE — Progress Notes (Signed)
Pre visit review using our clinic review tool, if applicable. No additional management support is needed unless otherwise documented below in the visit note. 

## 2015-07-16 NOTE — Patient Instructions (Addendum)
Will get sed rate today.   Recommend using tylenol for pain and can alternate with lose dose ibuprofen.  I am trying to get records from Dr. Bates(ENT)(their office states he never saw them) and Dr. Posey Pronto office. Will review there opinion on getting temporal artery biopsy.  You do have some sinus area pain and will prescribe cefdnir since you do have some sinus pressure.  Follow up 1 week or as needed

## 2015-07-19 ENCOUNTER — Telehealth: Payer: Self-pay | Admitting: Medical

## 2015-07-19 NOTE — Telephone Encounter (Signed)
Keep appointment with ENT. Glad he is feeling some better.

## 2015-08-03 ENCOUNTER — Encounter (HOSPITAL_BASED_OUTPATIENT_CLINIC_OR_DEPARTMENT_OTHER): Payer: Self-pay | Admitting: *Deleted

## 2015-08-03 ENCOUNTER — Emergency Department (HOSPITAL_BASED_OUTPATIENT_CLINIC_OR_DEPARTMENT_OTHER): Payer: Medicare Other

## 2015-08-03 ENCOUNTER — Emergency Department (HOSPITAL_BASED_OUTPATIENT_CLINIC_OR_DEPARTMENT_OTHER)
Admission: EM | Admit: 2015-08-03 | Discharge: 2015-08-03 | Disposition: A | Discharge status: Home / Self Care | Payer: Medicare Other | Attending: Physician Assistant | Admitting: Physician Assistant

## 2015-08-03 DIAGNOSIS — Z79899 Other long term (current) drug therapy: Secondary | ICD-10-CM | POA: Diagnosis not present

## 2015-08-03 DIAGNOSIS — Z8669 Personal history of other diseases of the nervous system and sense organs: Secondary | ICD-10-CM | POA: Insufficient documentation

## 2015-08-03 DIAGNOSIS — I10 Essential (primary) hypertension: Secondary | ICD-10-CM | POA: Insufficient documentation

## 2015-08-03 DIAGNOSIS — Z7982 Long term (current) use of aspirin: Secondary | ICD-10-CM | POA: Insufficient documentation

## 2015-08-03 DIAGNOSIS — G8929 Other chronic pain: Secondary | ICD-10-CM | POA: Diagnosis not present

## 2015-08-03 DIAGNOSIS — R519 Headache, unspecified: Secondary | ICD-10-CM

## 2015-08-03 DIAGNOSIS — J449 Chronic obstructive pulmonary disease, unspecified: Secondary | ICD-10-CM | POA: Diagnosis not present

## 2015-08-03 DIAGNOSIS — Z7951 Long term (current) use of inhaled steroids: Secondary | ICD-10-CM | POA: Insufficient documentation

## 2015-08-03 DIAGNOSIS — R51 Headache: Secondary | ICD-10-CM | POA: Diagnosis not present

## 2015-08-03 DIAGNOSIS — Z9889 Other specified postprocedural states: Secondary | ICD-10-CM | POA: Insufficient documentation

## 2015-08-03 DIAGNOSIS — R221 Localized swelling, mass and lump, neck: Secondary | ICD-10-CM | POA: Diagnosis not present

## 2015-08-03 MED ORDER — IOHEXOL 300 MG/ML  SOLN
75.0000 mL | Freq: Once | INTRAMUSCULAR | Status: AC | PRN
  Administered 2015-08-03: 75 mL via INTRAVENOUS

## 2015-08-03 NOTE — ED Notes (Signed)
Patient has a five month history of headaches.  His PCP has referred him to various Neurologist and has had a CT, which is negative.  States he is scheduled to see an ENT next week for further evaluation.  States two days ago, he pressed on the left posterior neck knot and the pain in his head intensified.  States this morning his head pain worsened and was relieved by tylenol.

## 2015-08-03 NOTE — ED Notes (Signed)
Headache for 5 months. He had a negative CT. After the CT he noticed an enlarged node in his neck. Headache is getting worse.

## 2015-08-03 NOTE — ED Provider Notes (Signed)
CSN: [id number]     Arrival date & time 08/03/15  1036 History   First MD Initiated Contact with Patient 08/03/15 1258     Chief Complaint  Patient presents with  . Headache     (Consider location/radiation/quality/duration/timing/severity/associated sxs/prior Treatment) HPI   Patient is a 55 year old male with history of hypertension and COPD, presenting with 5 months of headache. Patient seen 2 neurologists who've been unable to figure out the etiology of his headaches. He's had negative CAT scan in the past. Patient reports that he just discovered that when he presses his left posterior neck, the pain gets worse. Patient says he feels a lump there. Patient states his wife does to. Patient says that when he presses spot the pain gets much worse.  Past Medical History  Diagnosis Date  . Cardiomyopathy ([place])   . Hypertension   . Sleep apnea   . Allergy   . COPD (chronic obstructive pulmonary disease) ([place])   . Oxygen deficiency    Past Surgical History  Procedure Laterality Date  . Hand surgery Bilateral   . Ankle surgery Right   . Cardiac catheterization    . Hand surgery      Right  . Thumb arthroscopy    . Hand surgery      Left carpel tunnel   Family History  Problem Relation Age of Onset  . Heart disease Mother   . Heart disease Father    Social History  Substance Use Topics  . Smoking status: Never Smoker   . Smokeless tobacco: Never Used  . Alcohol Use: No    Review of Systems  Constitutional: Negative for activity change.  HENT: Negative for congestion.   Eyes: Negative for photophobia and visual disturbance.  Respiratory: Negative for shortness of breath.   Cardiovascular: Negative for chest pain.  Gastrointestinal: Negative for abdominal pain.  Neurological: Positive for headaches. Negative for seizures, syncope, speech difficulty, light-headedness and numbness.  Psychiatric/Behavioral: Negative for agitation.      Allergies  Review of patient's  allergies indicates no known allergies.  Home Medications   Prior to Admission medications   Medication Sig Start Date End Date Taking? Authorizing Provider  amLODipine (NORVASC) 10 MG tablet Take 10 mg by mouth daily.    Historical Provider, MD  aspirin 81 MG tablet Take 81 mg by mouth daily.    Historical Provider, MD  carvedilol (COREG) 25 MG tablet Take 25 mg by mouth 2 (two) times daily with a meal.    Historical Provider, MD  cefdinir (OMNICEF) 300 MG capsule Take 1 capsule (300 mg total) by mouth 2 (two) times daily. 07/16/15   Percell Miller Saguier, PA-C  cyclobenzaprine (FLEXERIL) 10 MG tablet Take 1 tablet (10 mg total) by mouth 3 (three) times daily as needed for muscle spasms. 06/04/15   Pieter Partridge, DO  digoxin (LANOXIN) 0.125 MG tablet Take by mouth daily.    Historical Provider, MD  doxazosin (CARDURA) 8 MG tablet Take 8 mg by mouth daily.    Historical Provider, MD  hydrALAZINE (APRESOLINE) 50 MG tablet Take 50 mg by mouth 3 (three) times daily.    Historical Provider, MD  hydrochlorothiazide (HYDRODIURIL) 50 MG tablet Take 1 tablet (50 mg total) by mouth daily. 08/03/14   Percell Miller Saguier, PA-C  ISOSORBIDE MONONITRATE PO Take 60 mg by mouth 2 (two) times daily.    Historical Provider, MD  mometasone-formoterol (DULERA) 200-5 MCG/ACT AERO Inhale 2 puffs into the lungs 2 (two) times daily.  Historical Provider, MD  quinapril (ACCUPRIL) 40 MG tablet Take 40 mg by mouth daily.    Historical Provider, MD  spironolactone (ALDACTONE) 25 MG tablet Take 25 mg by mouth daily.    Historical Provider, MD   BP 175/95 mmHg  Pulse 72  Temp(Src) 98.1 F (36.7 C) (Oral)  Resp 20  Ht '5\' 9"'$  (1.753 m)  Wt 202 lb (91.627 kg)  BMI 29.82 kg/m2  SpO2 96% Physical Exam  Constitutional: He is oriented to person, place, and time. He appears well-nourished.  HENT:  Head: Normocephalic.  Mouth/Throat: Oropharynx is clear and moist.  Eyes: Conjunctivae are normal.  Neck: No tracheal deviation present.   I cannot palpate this lump that the patient feels. Bilateral neck feels equivalent.  Cardiovascular: Normal rate.   Pulmonary/Chest: Effort normal. No stridor. No respiratory distress.  Abdominal: Soft. There is no tenderness. There is no guarding.  Musculoskeletal: Normal range of motion. He exhibits no edema.  Neurological: He is oriented to person, place, and time. No cranial nerve deficit.  Skin: Skin is warm and dry. No rash noted. He is not diaphoretic.  Psychiatric: He has a normal mood and affect. His behavior is normal.  Nursing note and vitals reviewed.   ED Course  Procedures (including critical care time) Labs Review Labs Reviewed - No data to display  Imaging Review Ct Head Wo Contrast  08/03/2015  CLINICAL DATA:  Persistent headaches for 5 months. EXAM: CT HEAD WITHOUT CONTRAST TECHNIQUE: Contiguous axial images were obtained from the base of the skull through the vertex without intravenous contrast. COMPARISON:  04/30/2015 FINDINGS: There is no evidence of mass effect, midline shift or extra-axial fluid collections. There is no evidence of a space-occupying lesion or intracranial hemorrhage. There is no evidence of a cortical-based area of acute infarction. The ventricles and sulci are appropriate for the patient's age. The basal cisterns are patent. Visualized portions of the orbits are unremarkable. The visualized portions of the paranasal sinuses and mastoid air cells are unremarkable. The osseous structures are unremarkable. IMPRESSION: No acute intracranial pathology. Electronically Signed   By: Kathreen Devoid   On: 08/03/2015 14:16   Ct Soft Tissue Neck W Contrast  08/03/2015  CLINICAL DATA:  Palpable lump behind left ear. Painful. Rule out abscess. EXAM: CT NECK WITH CONTRAST TECHNIQUE: Multidetector CT imaging of the neck was performed using the standard protocol following the bolus administration of intravenous contrast. CONTRAST:  47m OMNIPAQUE IOHEXOL 300 MG/ML  SOLN  COMPARISON:  None. FINDINGS: Pharynx and larynx: Normal pharyngeal soft tissues. No mass or edema. Epiglottis and larynx normal. No airway compromise. The new Salivary glands: Parotid gland normal bilaterally. Submandibular gland bilaterally normal. Negative for mass or stone. Thyroid: Thyroid normal in size without focal nodule Lymph nodes: Left level 2 lymph nodes measuring 12 mm and 9 mm. Left posterior lymph node 7.5 mm. No pathologic nodes. No abscess or enlarged node posterior to the left ear. No edema in the surrounding soft tissues. Vascular: Jugular vein patent bilaterally. Mild atherosclerotic disease in the carotid bifurcation bilaterally without significant stenosis. Limited intracranial: Negative Visualized orbits: Not imaged Mastoids and visualized paranasal sinuses: Negative Skeleton: Mild cervical degenerative change. No fracture or bone lesion. Upper chest: Lung apices clear.  No infiltrate or nodule. IMPRESSION: Negative for mass or adenopathy. Non pathologic lymph nodes in the left neck. One of these could be symptomatic. No abscess identified. Electronically Signed   By: CFranchot GalloM.D.   On: 08/03/2015 14:22  I have personally reviewed and evaluated these images and lab results as part of my medical decision-making.   EKG Interpretation None      MDM   Final diagnoses:  Chronic intractable headache, unspecified headache type    Patient is a 55 year old male presented with 5 months of headache. Patient has normal cranial nerve exam. He's been seen by 2 neurologist who wanted been unable to determine the etiology of his pain. Patient reports he is new onset of a lump in his left neck which is causing the pain to get worse. My best estimation is that this is a trigger point for pain causing headache. Patient's very concerned about this lump and is insistent that is present on the left posterior neck. I do not feel the presence of this lump. We will get CT of his neck to make  sure there is no abscess, lymphadenopathy or unexpected tissue.   Patient took Tylenol prior to arrival and is refusing ibuprofen at this time.  3:24 PM CT normal.  Patietn awake with lights on, watching TV in NAD. Will have him follow up wth PCP.  Courteney Julio Alm, MD 08/03/15 1524

## 2015-08-03 NOTE — Discharge Instructions (Signed)
We are unsure what is causing your headache. Your CTs were all nomral.  Please retuen with fever or other concerns.  General Headache Without Cause A headache is pain or discomfort felt around the head or neck area. There are many causes and types of headaches. In some cases, the cause may not be found.  HOME CARE  Managing Pain  Take over-the-counter and prescription medicines only as told by your doctor.  Lie down in a dark, quiet room when you have a headache.  If directed, apply ice to the head and neck area:  Put ice in a plastic bag.  Place a towel between your skin and the bag.  Leave the ice on for 20 minutes, 2-3 times per day.  Use a heating pad or hot shower to apply heat to the head and neck area as told by your doctor.  Keep lights dim if bright lights bother you or make your headaches worse. Eating and Drinking  Eat meals on a regular schedule.  Lessen how much alcohol you drink.  Lessen how much caffeine you drink, or stop drinking caffeine. General Instructions  Keep all follow-up visits as told by your doctor. This is important.  Keep a journal to find out if certain things bring on headaches. For example, write down:  What you eat and drink.  How much sleep you get.  Any change to your diet or medicines.  Relax by getting a massage or doing other relaxing activities.  Lessen stress.  Sit up straight. Do not tighten (tense) your muscles.  Do not use tobacco products. This includes cigarettes, chewing tobacco, or e-cigarettes. If you need help quitting, ask your doctor.  Exercise regularly as told by your doctor.  Get enough sleep. This often means 7-9 hours of sleep. GET HELP IF:  Your symptoms are not helped by medicine.  You have a headache that feels different than the other headaches.  You feel sick to your stomach (nauseous) or you throw up (vomit).  You have a fever. GET HELP RIGHT AWAY IF:   Your headache becomes really  bad.  You keep throwing up.  You have a stiff neck.  You have trouble seeing.  You have trouble speaking.  You have pain in the eye or ear.  Your muscles are weak or you lose muscle control.  You lose your balance or have trouble walking.  You feel like you will pass out (faint) or you pass out.  You have confusion.   This information is not intended to replace advice given to you by your health care provider. Make sure you discuss any questions you have with your health care provider.   Document Released: 03/18/2008 Document Revised: 02/28/2015 Document Reviewed: 10/02/2014 Elsevier Interactive Patient Education [employer].

## 2015-08-03 NOTE — ED Notes (Signed)
Pt given d/c instructions. Verbalizes understanding. No questions.

## 2015-08-03 NOTE — ED Notes (Signed)
Pt sitting in room watching TV holding left side of face. PERRL. Equal grips bilat. C/O H/A. VS taken. BP elevated, but pt states this is nrl for him.

## 2015-09-03 ENCOUNTER — Encounter: Payer: Self-pay | Admitting: Medical

## 2015-09-03 ENCOUNTER — Ambulatory Visit (INDEPENDENT_AMBULATORY_CARE_PROVIDER_SITE_OTHER): Payer: Medicare Other | Admitting: Medical

## 2015-09-03 VITALS — BP 130/92 | HR 81 | Temp 98.1°F | Ht 69.0 in | Wt 201.2 lb

## 2015-09-03 DIAGNOSIS — M542 Cervicalgia: Secondary | ICD-10-CM

## 2015-09-03 DIAGNOSIS — R591 Generalized enlarged lymph nodes: Secondary | ICD-10-CM | POA: Diagnosis not present

## 2015-09-03 DIAGNOSIS — R51 Headache: Secondary | ICD-10-CM

## 2015-09-03 DIAGNOSIS — R519 Headache, unspecified: Secondary | ICD-10-CM

## 2015-09-03 DIAGNOSIS — L739 Follicular disorder, unspecified: Secondary | ICD-10-CM | POA: Diagnosis not present

## 2015-09-03 MED ORDER — DOXYCYCLINE HYCLATE 100 MG PO TABS: 100.0000 mg | ORAL_TABLET | Freq: Two times a day (BID) | ORAL | Status: DC

## 2015-09-03 MED ORDER — CYCLOBENZAPRINE HCL 10 MG PO TABS: 10.0000 mg | ORAL_TABLET | Freq: Every day | ORAL | Status: DC

## 2015-09-03 NOTE — Progress Notes (Signed)
Subjective:    Patient ID: Matthew Nixon, male    DOB: [birth year], 55 y.o.   MRN: [id number]  HPI   Pt in for follow up. Pt still has occasional pain in his left temporal area(occurs on daily basis and is constant at time). Sometimes he feels like maybe his maxillary sinus region hurts. He speculates that may be sinus infection or tooth infection.  Pt states his left eye was examined by Opthalmology in the past. Pt declined use of steroid for possible temporal arteritis. I referred pt to [place] surgeon and some messages were left on his machine. Appears her did talk with Dr. Redmond Baseman office per chart review.. Dr. Posey Pronto ophthalmologist did advise pt he did not think he had any visual problems. No mention of getting temporal artery biospy per pt.  Pt had negative ct scan in past ordered by myself. Pt seen by Dr. Tomi Likens who though maybe tension related HA.   Pt speculate maybe tooth issue. But no pain in upper teeth. Occaisonal mild bleed of the gums.  Pt pain level is level 7/10 at times. No vision changes on that side. Pt is taking tylenol 1-2 tab a day.   I did refer pt to [facility]. I reviewed referral comments and he refused referral.  Pt also seen by ED the other day on 10th. Pt Ct of neck was negative. He thought he felt lump in his neck at that time. ED physician thought he may have trigger point in his neck that may be causing HA. Pt only took tylenol in ED and her refused ibuprofen.  Above is from last visit note as well as review from last ED  Note.  Pt describes that he did have very tender area on his neck. Pain level 8. But when point tender causes severe 10/10 pain toward left side of his temporal area. No visual distrubance reported and not gross motor/sensory function deficits. Reported.  He also has faint small tender in scalp area. 2 follicles inflamed in this area.  I saw pt on 07-16-2015. He states pain flared.      Review of Systems  Constitutional: Negative for  fever, chills and fatigue.  Respiratory: Negative for cough, chest tightness, shortness of breath and wheezing.   Cardiovascular: Negative for chest pain and palpitations.  Gastrointestinal: Negative for abdominal pain.  Musculoskeletal: Positive for neck pain. Negative for back pain, joint swelling, gait problem and neck stiffness.  Skin: Negative for rash.  Neurological: Positive for headaches. Negative for dizziness, syncope, speech difficulty, weakness, light-headedness and numbness.  Hematological: Positive for adenopathy. Does not bruise/bleed easily.  Psychiatric/Behavioral: Negative for behavioral problems, confusion and dysphoric mood.    Past Medical History  Diagnosis Date  . Cardiomyopathy ([place])   . Hypertension   . Sleep apnea   . Allergy   . COPD (chronic obstructive pulmonary disease) ([place])   . Oxygen deficiency     Social History   Social History  . Marital Status: Married    Spouse Name: N/A  . Number of Children: N/A  . Years of Education: N/A   Occupational History  . Not on file.   Social History Main Topics  . Smoking status: Never Smoker   . Smokeless tobacco: Never Used  . Alcohol Use: No  . Drug Use: No  . Sexual Activity: Not on file   Other Topics Concern  . Not on file   Social History Narrative   2 years college  Past Surgical History  Procedure Laterality Date  . Hand surgery Bilateral   . Ankle surgery Right   . Cardiac catheterization    . Hand surgery      Right  . Thumb arthroscopy    . Hand surgery      Left carpel tunnel    Family History  Problem Relation Age of Onset  . Heart disease Mother   . Heart disease Father     No Known Allergies  Current Outpatient Prescriptions on File Prior to Visit  Medication Sig Dispense Refill  . amLODipine (NORVASC) 10 MG tablet Take 10 mg by mouth daily.    [name] aspirin 81 MG tablet Take 81 mg by mouth daily.    . carvedilol (COREG) 25 MG tablet Take 25 mg by mouth 2 (two)  times daily with a meal.    . cyclobenzaprine (FLEXERIL) 10 MG tablet Take 1 tablet (10 mg total) by mouth 3 (three) times daily as needed for muscle spasms. 30 tablet 3  . digoxin (LANOXIN) 0.125 MG tablet Take by mouth daily.    [name] doxazosin (CARDURA) 8 MG tablet Take 8 mg by mouth daily.    . hydrALAZINE (APRESOLINE) 50 MG tablet Take 50 mg by mouth 3 (three) times daily.    . hydrochlorothiazide (HYDRODIURIL) 50 MG tablet Take 1 tablet (50 mg total) by mouth daily. 30 tablet 3  . ISOSORBIDE MONONITRATE PO Take 60 mg by mouth 2 (two) times daily.    . mometasone-formoterol (DULERA) 200-5 MCG/ACT AERO Inhale 2 puffs into the lungs 2 (two) times daily.    . quinapril (ACCUPRIL) 40 MG tablet Take 40 mg by mouth daily.    [name] spironolactone (ALDACTONE) 25 MG tablet Take 25 mg by mouth daily.     No current facility-administered medications on file prior to visit.    BP 130/92 mmHg  Pulse 81  Temp(Src) 98.1 F (36.7 C) (Oral)  Ht '5\' 9"'$  (1.753 m)  Wt 201 lb 3.2 oz (91.264 kg)  BMI 29.70 kg/m2  SpO2 98%       Objective:   Physical Exam  General Mental Status- Alert. General Appearance- Not in acute distress.   Skin General: Color- Normal Color. Moisture- Normal Moisture.  Left temporal area- mild tender area to palpation directly. small palpable pain in this region.No obvious dilation presently. [name] HEENT Head- Normal. Ear Auditory Canal - Left- Normal. Right - Normal.Tympanic Membrane- Left- Normal. Right- Normal. Eye Sclera/Conjunctiva- Left- Normal. Right- Normal. Nose & Sinuses Nasal Mucosa- Left- Not Boggy or Congested. Right- Not Boggy or Congested.Bilateral nn maxillary and no  frontal sinus pressure. Mouth & Throat Lips: Upper Lip- Normal: no dryness, cracking, pallor, cyanosis, or vesicular eruption. Lower Lip-Normal: no dryness, cracking, pallor, cyanosis or vesicular eruption. Buccal Mucosa- Bilateral- No Aphthous ulcers. Oropharynx- No Discharge or  Erythema. Tonsils: Characteristics- Bilateral- No Erythema or Congestion. Size/Enlargement- Bilateral- No enlargement. Discharge- bilateral-None.   Neck Carotid Arteries- Normal color. Moisture- Normal Moisture. No carotid bruits. No JVD.  Very tender left posterior trapezius area. Point tender area about size of a pea. Feels like lymph node. Direct palpation to this area causes severe sharp pain in left temporal area.   Chest and Lung Exam Auscultation: Breath Sounds:-Normal.  Cardiovascular Auscultation:Rythm- Regular. Murmurs & Other Heart Sounds:Auscultation of the heart reveals- No Murmurs.  Abdomen Inspection:-Inspeection Normal. Palpation/Percussion:Note:No mass. Palpation and Percussion of the abdomen reveal- Non Tender, Non Distended + BS, no rebound or guarding.   Skin- 2 small follicle  inflamed. in occipital area.   Lt temporal area- no dilated vein but direct pain on palpation.     Neurologic Cranial Nerve exam:- CN III-XII intact(No nystagmus), symmetric smile. EOM intact. Strength:- 5/5 equal and symmetric strength both upper and lower extremities.      Assessment & Plan:  For follicle inflamed scalp tx doxycycline. Make sure not taking on empty stomach.  For you head ache in left temporal area will refer to ENT Dr. Redmond Baseman. Evaluate the temporal area. Maybe temporal artery biopsy.  For lymph node that is very tender may be associated with follicles inflamed. Will see if decrease in tenderness with antibiotic use. Will have ENT evaluate lymph node as well.  For possible tension ha component present take flexeril at night. Can use tylenol during the day.  Follow up in 7 days or as needed.  Pt still aware severe HA or gross motor/sensory function deficits then ED evaluation.

## 2015-09-03 NOTE — Progress Notes (Signed)
Pre visit review using our clinic review tool, if applicable. No additional management support is needed unless otherwise documented below in the visit note. 

## 2015-09-03 NOTE — Patient Instructions (Addendum)
For follicle inflamed scalp tx doxycycline. Make sure not taking on empty stomach.  For you head ache in left temporal area will refer to ENT Dr. Redmond Baseman. Evaluate the temporal area. Maybe temporal artery biopsy.  For lymph node that is very tender may be associated with follicles inflamed. Will see if decrease in tenderness with antibiotic use. Will have ENT evaluate lymph node as well.  For possible tension ha component present take flexeril at night. Can use tylenol during the day.  Follow up in 7 days or as needed.

## 2015-09-18 DIAGNOSIS — I429 Cardiomyopathy, unspecified: Secondary | ICD-10-CM | POA: Diagnosis not present

## 2015-09-18 DIAGNOSIS — R0989 Other specified symptoms and signs involving the circulatory and respiratory systems: Secondary | ICD-10-CM | POA: Insufficient documentation

## 2015-09-18 DIAGNOSIS — R51 Headache: Secondary | ICD-10-CM | POA: Diagnosis not present

## 2015-09-18 DIAGNOSIS — R591 Generalized enlarged lymph nodes: Secondary | ICD-10-CM | POA: Diagnosis not present

## 2015-09-18 DIAGNOSIS — R519 Headache, unspecified: Secondary | ICD-10-CM | POA: Insufficient documentation

## 2015-09-18 DIAGNOSIS — M26602 Left temporomandibular joint disorder, unspecified: Secondary | ICD-10-CM | POA: Diagnosis not present

## 2015-10-08 DIAGNOSIS — I5022 Chronic systolic (congestive) heart failure: Secondary | ICD-10-CM | POA: Diagnosis not present

## 2015-10-08 DIAGNOSIS — I340 Nonrheumatic mitral (valve) insufficiency: Secondary | ICD-10-CM | POA: Diagnosis not present

## 2015-10-08 DIAGNOSIS — I10 Essential (primary) hypertension: Secondary | ICD-10-CM | POA: Diagnosis not present

## 2015-10-10 DIAGNOSIS — Z79899 Other long term (current) drug therapy: Secondary | ICD-10-CM | POA: Diagnosis not present

## 2015-10-10 DIAGNOSIS — R591 Generalized enlarged lymph nodes: Secondary | ICD-10-CM | POA: Diagnosis not present

## 2015-10-10 DIAGNOSIS — D751 Secondary polycythemia: Secondary | ICD-10-CM | POA: Diagnosis not present

## 2015-10-10 DIAGNOSIS — M7500 Adhesive capsulitis of unspecified shoulder: Secondary | ICD-10-CM | POA: Diagnosis not present

## 2015-10-10 DIAGNOSIS — R51 Headache: Secondary | ICD-10-CM | POA: Diagnosis not present

## 2015-10-16 ENCOUNTER — Other Ambulatory Visit (HOSPITAL_BASED_OUTPATIENT_CLINIC_OR_DEPARTMENT_OTHER): Payer: Self-pay | Admitting: Hematology and Oncology

## 2015-10-16 DIAGNOSIS — R599 Enlarged lymph nodes, unspecified: Secondary | ICD-10-CM

## 2015-10-16 DIAGNOSIS — R519 Headache, unspecified: Secondary | ICD-10-CM

## 2015-10-16 DIAGNOSIS — R51 Headache: Principal | ICD-10-CM

## 2015-10-20 ENCOUNTER — Other Ambulatory Visit (HOSPITAL_BASED_OUTPATIENT_CLINIC_OR_DEPARTMENT_OTHER): Payer: Self-pay

## 2015-10-27 ENCOUNTER — Ambulatory Visit (HOSPITAL_BASED_OUTPATIENT_CLINIC_OR_DEPARTMENT_OTHER): Payer: Medicare Other

## 2015-11-30 DIAGNOSIS — Z1211 Encounter for screening for malignant neoplasm of colon: Secondary | ICD-10-CM | POA: Diagnosis not present

## 2015-12-06 ENCOUNTER — Encounter: Payer: Self-pay | Admitting: Medical

## 2015-12-06 ENCOUNTER — Telehealth: Payer: Self-pay | Admitting: Medical

## 2015-12-06 ENCOUNTER — Ambulatory Visit (INDEPENDENT_AMBULATORY_CARE_PROVIDER_SITE_OTHER): Payer: Medicare Other | Admitting: Medical

## 2015-12-06 VITALS — BP 140/90 | HR 71 | Temp 98.1°F | Ht 69.0 in | Wt 204.4 lb

## 2015-12-06 DIAGNOSIS — I428 Other cardiomyopathies: Secondary | ICD-10-CM | POA: Diagnosis not present

## 2015-12-06 DIAGNOSIS — R0600 Dyspnea, unspecified: Secondary | ICD-10-CM

## 2015-12-06 DIAGNOSIS — I429 Cardiomyopathy, unspecified: Secondary | ICD-10-CM

## 2015-12-06 DIAGNOSIS — Z87898 Personal history of other specified conditions: Secondary | ICD-10-CM | POA: Diagnosis not present

## 2015-12-06 LAB — BRAIN NATRIURETIC PEPTIDE: Pro B Natriuretic peptide (BNP): 76 pg/mL (ref 0.0–100.0)

## 2015-12-06 LAB — COMPREHENSIVE METABOLIC PANEL
ALT: 69 U/L — ABNORMAL HIGH (ref 0–53)
AST: 38 U/L — ABNORMAL HIGH (ref 0–37)
Albumin: 4.4 g/dL (ref 3.5–5.2)
Alkaline Phosphatase: 68 U/L (ref 39–117)
BUN: 16 mg/dL (ref 6–23)
CO2: 30 mEq/L (ref 19–32)
Calcium: 9.6 mg/dL (ref 8.4–10.5)
Chloride: 104 mEq/L (ref 96–112)
Creatinine, Ser: 0.96 mg/dL (ref 0.40–1.50)
GFR: 86.12 mL/min (ref >60.00)
Glucose, Bld: 84 mg/dL (ref 70–99)
Potassium: 4 mEq/L (ref 3.5–5.1)
Sodium: 138 mEq/L (ref 135–145)
Total Bilirubin: 0.5 mg/dL (ref 0.2–1.2)
Total Protein: 7.4 g/dL (ref 6.0–8.3)

## 2015-12-06 LAB — D-DIMER, QUANTITATIVE: D-Dimer, Quant: 0.35 mcg/mL FEU (ref <0.50)

## 2015-12-06 LAB — DIGOXIN LEVEL: Digoxin Level: <0.5 ug/L — ABNORMAL LOW (ref 0.8–2.0)

## 2015-12-06 LAB — TROPONIN I: TNIDX: 0.05 ug/l (ref 0.00–0.06)

## 2015-12-06 NOTE — Telephone Encounter (Signed)
Opened to review 

## 2015-12-06 NOTE — Progress Notes (Signed)
Pre visit review using our clinic review tool, if applicable. No additional management support is needed unless otherwise documented below in the visit note. 

## 2015-12-06 NOTE — Patient Instructions (Addendum)
For your history of recent chest pain, cardiomyopathy and dyspnea will get cmp, digoxin level, cxr, bnp, troponin, d-dimer and try to get old ekg to compare to prior ekg.  I did call your cardiologist in [place] to get last ekg to compare to one done today.  If your d-dimer test is elevated then will need to get ct of chest to evaluate if PE present.  Follow up in  10 days or as needed.  I am gone all next week so if need to be seen other providers should be available in our office.  If you have recurrent active chest pain type symptoms then recommend ED evaluation.  I am going to go ahead and refer you to our local cardiologist.

## 2015-12-06 NOTE — Progress Notes (Signed)
Subjective:    Patient ID: Matthew Nixon, male    DOB: 1960-11-03, 55 y.o.   MRN: [id number]  HPI  Pt in reporting that he went to [place] about 1.5 months ago. While he was there he had some chest pain. Pain would last 5-6 minutes and then resolve. This reoccurs randomly at times. He reports getting easily shortness of breath.   Pt has known cardiomegaly. In past he was in [place] and saw his cardiologist. He saw cardiologist October 08, 2015. Pt is on digoxin. Coreg, amlodipine, hctz, accupril , and spirinolactone and is on isosorbide.   Pt last had chest pain sensation on Sunday. Last briefly while having sex.None since.  Pt never got jaw pain, shoulder pain or arm pain with chest pain sensation.   Pt weight 204. In march was 201.  Estimates chest pain and sob episodes occuring 2-3 times a week.    Review of Systems  Constitutional: Negative for fever, chills and fatigue.  Respiratory: Positive for shortness of breath. Negative for cough, chest tightness and wheezing.   Cardiovascular: Negative for chest pain and palpitations.       Pt had chest pain last on Sunday. Happens randomly about 2-3 times a week.  Gastrointestinal: Negative for abdominal pain.  Musculoskeletal: Negative for back pain.  Skin: Negative for rash.  Psychiatric/Behavioral: Negative for behavioral problems and confusion.    Past Medical History  Diagnosis Date  . Cardiomyopathy ([place])   . Hypertension   . Sleep apnea   . Allergy   . COPD (chronic obstructive pulmonary disease) ([place])   . Oxygen deficiency      Social History   Social History  . Marital Status: Married    Spouse Name: N/A  . Number of Children: N/A  . Years of Education: N/A   Occupational History  . Not on file.   Social History Main Topics  . Smoking status: Never Smoker   . Smokeless tobacco: Never Used  . Alcohol Use: No  . Drug Use: No  . Sexual Activity: Not on file   Other Topics Concern  . Not on  file   Social History Narrative   2 years college     Past Surgical History  Procedure Laterality Date  . Hand surgery Bilateral   . Ankle surgery Right   . Cardiac catheterization    . Hand surgery      Right  . Thumb arthroscopy    . Hand surgery      Left carpel tunnel    Family History  Problem Relation Age of Onset  . Heart disease Mother   . Heart disease Father     No Known Allergies  Current Outpatient Prescriptions on File Prior to Visit  Medication Sig Dispense Refill  . amLODipine (NORVASC) 10 MG tablet Take 10 mg by mouth daily.    [name] aspirin 81 MG tablet Take 81 mg by mouth daily.    . carvedilol (COREG) 25 MG tablet Take 25 mg by mouth 2 (two) times daily with a meal.    . cyclobenzaprine (FLEXERIL) 10 MG tablet Take 1 tablet (10 mg total) by mouth at bedtime. 14 tablet 0  . digoxin (LANOXIN) 0.125 MG tablet Take by mouth daily.    [name] doxazosin (CARDURA) 8 MG tablet Take 8 mg by mouth daily.    [name] doxycycline (VIBRA-TABS) 100 MG tablet Take 1 tablet (100 mg total) by mouth 2 (two) times daily. 14 tablet 0  .  hydrALAZINE (APRESOLINE) 50 MG tablet Take 50 mg by mouth 3 (three) times daily.    . hydrochlorothiazide (HYDRODIURIL) 50 MG tablet Take 1 tablet (50 mg total) by mouth daily. 30 tablet 3  . ISOSORBIDE MONONITRATE PO Take 60 mg by mouth 2 (two) times daily.    . mometasone-formoterol (DULERA) 200-5 MCG/ACT AERO Inhale 2 puffs into the lungs 2 (two) times daily.    . quinapril (ACCUPRIL) 40 MG tablet Take 40 mg by mouth daily.    [name] spironolactone (ALDACTONE) 25 MG tablet Take 25 mg by mouth daily.     No current facility-administered medications on file prior to visit.    BP 140/90 mmHg  Pulse 71  Temp(Src) 98.1 F (36.7 C) (Oral)  Ht '5\' 9"'$  (1.753 m)  Wt 204 lb 6.4 oz (92.715 kg)  BMI 30.17 kg/m2  SpO2 97%       Objective:   Physical Exam  General Mental Status- Alert. General Appearance- Not in acute distress.   Skin General: Color-  Normal Color. Moisture- Normal Moisture.  Neck Carotid Arteries- Normal color. Moisture- Normal Moisture. No carotid bruits. No JVD.  Chest and Lung Exam Auscultation: Breath Sounds:-Normal.  Cardiovascular Auscultation:Rythm- Regular. Murmurs & Other Heart Sounds:Auscultation of the heart reveals- No Murmurs.  Abdomen Inspection:-Inspeection Normal. Palpation/Percussion:Note:No mass. Palpation and Percussion of the abdomen reveal- Non Tender, Non Distended + BS, no rebound or guarding.    Neurologic Cranial Nerve exam:- CN III-XII intact(No nystagmus), symmetric smile. Drift Test:- No drift. Romberg Exam:- Negative.  Heal to Toe Gait exam:-Normal. Finger to Nose:- Normal/Intact Strength:- 5/5 equal and symmetric strength both upper and lower extremities.  Lower ext- No pedal edema. Negative homans signs.     Assessment & Plan:  For your history of recent chest pain, cardiomyopathy and dyspnea will get cmp, digoxin level, cxr, bnp, troponin, d-dimer and try to get old ekg to compare to prior ekg.  I did call your cardiologist in [place] to get last ekg to compare to one done today.  If your d-dimer test is elevated then will need to get ct of chest to evaluate if PE present.  Follow up in  10 days or as needed.  I am gone all next week so if need to be seen other providers should be available in our office.  If you have recurrent active chest pain type symptoms then recommend ED evaluation.  I am going to go ahead and refer you to our local cardiologist.  Cardiologist did not send ekg. Pt waited an hour or more in office. On further discussion he states he is aware of left bundle branch block. He states he has been told for years this the case. So in light of this(not new) I will let him go. He will go home and get his old cardiology records and bring them in today so we can make copies. This will be helpful since unknown when his prior cardiologist will send  records.  Saguier, Percell Miller, PA-C

## 2015-12-08 ENCOUNTER — Ambulatory Visit (HOSPITAL_BASED_OUTPATIENT_CLINIC_OR_DEPARTMENT_OTHER)
Admission: RE | Admit: 2015-12-08 | Discharge: 2015-12-08 | Disposition: A | Discharge status: Home / Self Care | Payer: Medicare Other | Source: Ambulatory Visit | Attending: Hematology and Oncology | Admitting: Hematology and Oncology

## 2015-12-08 DIAGNOSIS — R519 Headache, unspecified: Secondary | ICD-10-CM

## 2015-12-08 DIAGNOSIS — R51 Headache: Principal | ICD-10-CM

## 2015-12-08 DIAGNOSIS — R599 Enlarged lymph nodes, unspecified: Secondary | ICD-10-CM

## 2015-12-15 ENCOUNTER — Telehealth: Payer: Self-pay | Admitting: Medical

## 2015-12-15 NOTE — Telephone Encounter (Signed)
This message is to inform you that we have made consecutive attempts to contact the patient. Although we were unsuccessful in these attempts we wanted you to be aware of our efforts. Will remove the patient from our [place] at this time.          6-23 @ 4:48pm (3rd attempt) called pt offered first available and he said he will see his cardiologist in [place] in July.        Thank You,    [facility]    The above is message that pt declined referral even though explained why wanted local referral. Pt declined referral.

## 2015-12-18 ENCOUNTER — Telehealth: Payer: Self-pay | Admitting: Medical

## 2015-12-18 NOTE — Telephone Encounter (Signed)
Pt wants to know why I ordered chest xray. Please call and explain ws done to evaluate size of heart and see if any pulmonary congestion that can be present with cardiomyopathy. Which he has. I also want to know why he did not accept referral to cardiologist. I think I got note that attempt was made to get that set up but was not done?

## 2015-12-19 ENCOUNTER — Telehealth: Payer: Self-pay | Admitting: Medical

## 2015-12-19 NOTE — Telephone Encounter (Signed)
Advise pt would still establish and see cardiologist locally. First appointment may be delayed but when established and if need future fast consult then will be available. I do think this would be wise since he lives her locally. If he agrees will reactivate that referral.

## 2015-12-19 NOTE — Telephone Encounter (Signed)
Spoke with pt and he states that he can't wait until the end of August to get in with cardiology with our doctors in [place]. Pt states that he has called his cardiologist in [place] and they can see him in 3 weeks. He states that he will have his cardiologist in [place] complete a Chest X-Ray and he will not have one completed down here at this time.

## 2015-12-20 NOTE — Telephone Encounter (Signed)
Spoke with Hoyle Sauer in imaging and she has postponed the Chest X-ray at this time. Also left message advising pt of the note per E. Saguier and pt was asked to call back with questions. If patient is agreeable to reactivate the referral for cardiology in [place] per E.Saguier's request we can put in a new referral.

## 2016-01-07 DIAGNOSIS — R072 Precordial pain: Secondary | ICD-10-CM | POA: Diagnosis not present

## 2016-01-07 DIAGNOSIS — I340 Nonrheumatic mitral (valve) insufficiency: Secondary | ICD-10-CM | POA: Diagnosis not present

## 2016-01-07 DIAGNOSIS — I5022 Chronic systolic (congestive) heart failure: Secondary | ICD-10-CM | POA: Diagnosis not present

## 2016-02-05 DIAGNOSIS — I248 Other forms of acute ischemic heart disease: Secondary | ICD-10-CM | POA: Diagnosis not present

## 2016-02-05 DIAGNOSIS — I208 Other forms of angina pectoris: Secondary | ICD-10-CM | POA: Diagnosis not present

## 2016-03-17 ENCOUNTER — Telehealth: Payer: Self-pay | Admitting: Medical

## 2016-03-17 ENCOUNTER — Ambulatory Visit (INDEPENDENT_AMBULATORY_CARE_PROVIDER_SITE_OTHER): Payer: Medicare Other | Admitting: Medical

## 2016-03-17 ENCOUNTER — Encounter: Payer: Self-pay | Admitting: Medical

## 2016-03-17 VITALS — BP 160/80 | HR 75 | Temp 98.7°F | Ht 69.0 in | Wt 199.0 lb

## 2016-03-17 DIAGNOSIS — R519 Headache, unspecified: Secondary | ICD-10-CM

## 2016-03-17 DIAGNOSIS — R51 Headache: Secondary | ICD-10-CM

## 2016-03-17 DIAGNOSIS — I429 Cardiomyopathy, unspecified: Secondary | ICD-10-CM | POA: Diagnosis not present

## 2016-03-17 DIAGNOSIS — N400 Enlarged prostate without lower urinary tract symptoms: Secondary | ICD-10-CM

## 2016-03-17 DIAGNOSIS — R350 Frequency of micturition: Secondary | ICD-10-CM

## 2016-03-17 DIAGNOSIS — R143 Flatulence: Secondary | ICD-10-CM

## 2016-03-17 DIAGNOSIS — Z125 Encounter for screening for malignant neoplasm of prostate: Secondary | ICD-10-CM

## 2016-03-17 DIAGNOSIS — R6882 Decreased libido: Secondary | ICD-10-CM

## 2016-03-17 DIAGNOSIS — N529 Male erectile dysfunction, unspecified: Secondary | ICD-10-CM

## 2016-03-17 DIAGNOSIS — Z1211 Encounter for screening for malignant neoplasm of colon: Secondary | ICD-10-CM

## 2016-03-17 DIAGNOSIS — I10 Essential (primary) hypertension: Secondary | ICD-10-CM | POA: Diagnosis not present

## 2016-03-17 DIAGNOSIS — R140 Abdominal distension (gaseous): Secondary | ICD-10-CM | POA: Diagnosis not present

## 2016-03-17 NOTE — Telephone Encounter (Signed)
I forgot to ask/offer flu vaccine today. I was wondering if you had asked him. Would you call pt and remind he can get. With his medical history would recommend. If he declines please document.

## 2016-03-17 NOTE — Telephone Encounter (Signed)
Left message on answering macine regarding Flu vaccine. Requsted return call regarding this.

## 2016-03-17 NOTE — Patient Instructions (Addendum)
For cardiomyopathy continue current bp med treatment from cardiologist in [place].  For your htn continue same meds. But I want you to monitor bp. If not coming down to less than 140/90 like was last week I want you to call you cardiologist to see if you can increase your hydralazine to 50 mg 4 times daily.   Refer back to GI for colonsocpy. Maybe if cardiologist appoves increase hydralazine bp will be down when you get colonoscopy.  For bph continue cardura.  For ED. Will get testosterone levels. Will refer to urologist.  If you ha return with worse features as discussed then ED evaluation. But also if ha frequency reoccurs will refer to neurologist.  For your abdomen distension and varying loose and hard stools will get xray abdomen today. You can use metamucil 1 rounded tablespoon in 8 oz of water tree times daily.   Follow up in 1 month or as needed  Future labs for above conditions place today. You can get labs tomorrow at 7 am.

## 2016-03-17 NOTE — Progress Notes (Signed)
Pre visit review using our clinic tool,if applicable. No additional management support is needed unless otherwise documented below in the visit note.  

## 2016-03-17 NOTE — Progress Notes (Signed)
Subjective:    Patient ID: Matthew Nixon, male    DOB: 12/23/1960, 55 y.o.   MRN: [id number]  HPI   Pt in for evaluation.  Pt in states he wants to test his testosterone level. He states recent decrease in libido. He states used to have sex 3-5 times a week. But now last 5 months very low libido and no sex(although relationship with wife overall good). Pt states he doesn't need viagra. He just does not want to be on. Pt mentions he would like referral to urologist do know exact cause.  Pt in states he stomach feels bloated and he feels like he is bloated daily basis for months. Passes a lot of gas. Pt states this has been going on for about 3 years. No bright red blood in stools. No black stools.  I had referred pt to gi in the past. Looks like feb 2016. Pt not sure why he did not go. He speculates either he cancelled appointment or GI did. Pt then later remembered that Gi would not do in the past due to his elevated bp levels.  Pt has history of cardiomyopathy. Pt is still splitting his time between [facility] and [place]. I had referred pt to local cardiologist but he declined. Instead he saw his [place] cardiologist. Last time he saw his cardiologist was 3 months. Pt does realize now the need to have local cardiologist. But wants to wait until his house sells in [place] since he does travel up there fairly frequent per his reprot.  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   Pt in past had intermittent HA. Sed rate was low. Location at times was temporal. Pt states went to specialist to evaluate temporal artery. But pt states no cause found. He states now attributes these headache related to stress regarding owning property and getting 911/responder designation.  Pt has history of htn. Chronically elevated in past. Pt states 130/80 range. But today is high. No cardiac or neurologic signs or symptoms. Today his machine matches ours. On amlodipine 10 mg q day, on coreg  bid, cardura 8 mg q hs, hydralazine 50 mg tid, hctz 50 mg q day, accupril 40 mg q day, and spirinolactone 25 mg q day.  Pt has sleep apnea. He has machine. He states he did not sleep well last night.    Review of Systems  Constitutional: Negative for chills, fatigue and fever.  HENT: Negative for congestion, ear discharge, ear pain, postnasal drip, sinus pressure and sore throat.   Respiratory: Negative for cough, chest tightness, shortness of breath and wheezing.   Cardiovascular: Negative for chest pain and palpitations.  Gastrointestinal: Positive for abdominal distention. Negative for abdominal pain, blood in stool, constipation, diarrhea, nausea, rectal pain and vomiting.       Intermittent hard and soft stools. Varies.  Has 3 loose stools a day.  Endocrine: Negative for polydipsia, polyphagia and polyuria.  Genitourinary: Negative for difficulty urinating, flank pain, frequency, penile swelling, scrotal swelling, testicular pain and urgency.       ED over past 5 months.  Musculoskeletal: Negative for back pain and myalgias.  Skin: Negative for pallor and rash.  Neurological: Negative for dizziness, light-headedness, numbness and headaches.       Last ha was 2 weeks ago.  08-03-2015 ct of head was negative.  Hematological: Negative for adenopathy. Does not bruise/bleed easily.  Psychiatric/Behavioral: Negative for agitation, confusion and dysphoric mood. The patient is not nervous/anxious.  Past Medical History:  Diagnosis Date  . Allergy   . Cardiomyopathy ([place])   . COPD (chronic obstructive pulmonary disease) ([place])   . Hypertension   . Oxygen deficiency   . Sleep apnea      Social History   Social History  . Marital status: Married    Spouse name: N/A  . Number of children: N/A  . Years of education: N/A   Occupational History  . Not on file.   Social History Main Topics  . Smoking status: Never Smoker  . Smokeless tobacco: Never Used  . Alcohol use No  .  Drug use: No  . Sexual activity: Not on file   Other Topics Concern  . Not on file   Social History Narrative   2 years college     Past Surgical History:  Procedure Laterality Date  . ANKLE SURGERY Right   . CARDIAC CATHETERIZATION    . HAND SURGERY Bilateral   . HAND SURGERY     Right  . HAND SURGERY     Left carpel tunnel  . THUMB ARTHROSCOPY      Family History  Problem Relation Age of Onset  . Heart disease Mother   . Heart disease Father     No Known Allergies  Current Outpatient Prescriptions on File Prior to Visit  Medication Sig Dispense Refill  . amLODipine (NORVASC) 10 MG tablet Take 10 mg by mouth daily.    [name] aspirin 81 MG tablet Take 81 mg by mouth daily.    . carvedilol (COREG) 25 MG tablet Take 25 mg by mouth 2 (two) times daily with a meal.    . cyclobenzaprine (FLEXERIL) 10 MG tablet Take 1 tablet (10 mg total) by mouth at bedtime. 14 tablet 0  . digoxin (LANOXIN) 0.125 MG tablet Take by mouth daily.    [name] doxazosin (CARDURA) 8 MG tablet Take 8 mg by mouth daily.    [name] doxycycline (VIBRA-TABS) 100 MG tablet Take 1 tablet (100 mg total) by mouth 2 (two) times daily. 14 tablet 0  . hydrALAZINE (APRESOLINE) 50 MG tablet Take 50 mg by mouth 3 (three) times daily.    . hydrochlorothiazide (HYDRODIURIL) 50 MG tablet Take 1 tablet (50 mg total) by mouth daily. 30 tablet 3  . ISOSORBIDE MONONITRATE PO Take 60 mg by mouth 2 (two) times daily.    . mometasone-formoterol (DULERA) 200-5 MCG/ACT AERO Inhale 2 puffs into the lungs 2 (two) times daily.    . quinapril (ACCUPRIL) 40 MG tablet Take 40 mg by mouth daily.    [name] spironolactone (ALDACTONE) 25 MG tablet Take 25 mg by mouth daily.     No current facility-administered medications on file prior to visit.     BP (!) 160/80 Comment: Check myself.  Pulse 75   Temp 98.7 F (37.1 C) (Oral)   Ht '5\' 9"'$  (1.753 m)   Wt 199 lb (90.3 kg)   SpO2 98%   BMI 29.39 kg/m       Objective:   Physical  Exam  General Mental Status- Alert. General Appearance- Not in acute distress.   Skin General: Color- Normal Color. Moisture- Normal Moisture.  Neck Carotid Arteries- Normal color. Moisture- Normal Moisture. No carotid bruits. No JVD.  Chest and Lung Exam Auscultation: Breath Sounds:-Normal.  Cardiovascular Auscultation:Rythm- Regular. Murmurs & Other Heart Sounds:Auscultation of the heart reveals- No Murmurs.  Abdomen Inspection:-Inspeection Normal. Palpation/Percussion:Note:No mass. Palpation and Percussion of the abdomen reveal- Non Tender, Non Distended + BS,  no rebound or guarding.    Neurologic Cranial Nerve exam:- CN III-XII intact(No nystagmus), symmetric smile. Strength:- equal and symmetric 5/5. But rt lower ext baseline week(3/5). Since injury at work.       Assessment & Plan:  For cardiomyopathy continue current bp med treatment from cardiologist in [place].  For your htn continue same meds. But I want you to monitor bp. If not coming down to less than 140/90 like was last week I want you to call you cardiologist to see if you can increase your hydralazine to 50 mg 4 times daily.   Refer back to GI for colonsocpy. Maybe if cardiologist appoves increase hydralazine bp will be down when you get colonoscopy.  For bph continue cardura.  For ED. Will get testosterone levels. Will refer to urologist.  If you ha return with worse features as discussed then ED evaluation. But also if ha frequency reoccurs will refer to neurologist.  For your abdomen distension and varying loose and hard stools will get xray abdomen today. You can use metamucil 1 rounded tablespoon in 8 oz of water tree times daily.   Follow up in 1 month or as needed  Future labs for above conditions place today. You can get labs tomorrow at 7 am.  Mackie Pai, PA-C

## 2016-03-18 ENCOUNTER — Other Ambulatory Visit (INDEPENDENT_AMBULATORY_CARE_PROVIDER_SITE_OTHER): Payer: Medicare Other

## 2016-03-18 ENCOUNTER — Ambulatory Visit (HOSPITAL_BASED_OUTPATIENT_CLINIC_OR_DEPARTMENT_OTHER)
Admission: RE | Admit: 2016-03-18 | Discharge: 2016-03-18 | Disposition: A | Discharge status: Home / Self Care | Payer: Medicare Other | Source: Ambulatory Visit | Attending: Medical | Admitting: Medical

## 2016-03-18 DIAGNOSIS — R350 Frequency of micturition: Secondary | ICD-10-CM | POA: Diagnosis not present

## 2016-03-18 DIAGNOSIS — R6882 Decreased libido: Secondary | ICD-10-CM

## 2016-03-18 DIAGNOSIS — Z125 Encounter for screening for malignant neoplasm of prostate: Secondary | ICD-10-CM

## 2016-03-18 DIAGNOSIS — R140 Abdominal distension (gaseous): Secondary | ICD-10-CM | POA: Diagnosis not present

## 2016-03-18 DIAGNOSIS — I10 Essential (primary) hypertension: Secondary | ICD-10-CM

## 2016-03-18 DIAGNOSIS — R143 Flatulence: Secondary | ICD-10-CM | POA: Diagnosis not present

## 2016-03-18 DIAGNOSIS — N529 Male erectile dysfunction, unspecified: Secondary | ICD-10-CM

## 2016-03-18 LAB — COMPREHENSIVE METABOLIC PANEL
ALT: 30 U/L (ref 0–53)
AST: 18 U/L (ref 0–37)
Albumin: 4.1 g/dL (ref 3.5–5.2)
Alkaline Phosphatase: 65 U/L (ref 39–117)
BUN: 16 mg/dL (ref 6–23)
CO2: 30 mEq/L (ref 19–32)
Calcium: 9.2 mg/dL (ref 8.4–10.5)
Chloride: 104 mEq/L (ref 96–112)
Creatinine, Ser: 0.98 mg/dL (ref 0.40–1.50)
GFR: 84.01 mL/min (ref >60.00)
Glucose, Bld: 81 mg/dL (ref 70–99)
Potassium: 4.4 mEq/L (ref 3.5–5.1)
Sodium: 140 mEq/L (ref 135–145)
Total Bilirubin: 0.5 mg/dL (ref 0.2–1.2)
Total Protein: 6.7 g/dL (ref 6.0–8.3)

## 2016-03-18 LAB — LIPID PANEL
Cholesterol: 159 mg/dL (ref 0–200)
HDL: 38.9 mg/dL — ABNORMAL LOW (ref >39.00)
LDL Cholesterol: 96 mg/dL (ref 0–99)
NonHDL: 119.76
Total CHOL/HDL Ratio: 4
Triglycerides: 118 mg/dL (ref 0.0–149.0)
VLDL: 23.6 mg/dL (ref 0.0–40.0)

## 2016-03-18 LAB — CBC WITH DIFFERENTIAL/PLATELET
Basophils Absolute: 0 10*3/uL (ref 0.0–0.1)
Basophils Relative: 0.5 % (ref 0.0–3.0)
Eosinophils Absolute: 0.1 10*3/uL (ref 0.0–0.7)
Eosinophils Relative: 2.1 % (ref 0.0–5.0)
HCT: 49.9 % (ref 39.0–52.0)
Hemoglobin: 16.9 g/dL (ref 13.0–17.0)
Lymphocytes Relative: 34.2 % (ref 12.0–46.0)
Lymphs Abs: 2.3 10*3/uL (ref 0.7–4.0)
MCHC: 33.8 g/dL (ref 30.0–36.0)
MCV: 92.4 fl (ref 78.0–100.0)
Monocytes Absolute: 0.6 10*3/uL (ref 0.1–1.0)
Monocytes Relative: 8.2 % (ref 3.0–12.0)
Neutro Abs: 3.7 10*3/uL (ref 1.4–7.7)
Neutrophils Relative %: 55 % (ref 43.0–77.0)
Platelets: 220 10*3/uL (ref 150.0–400.0)
RBC: 5.4 Mil/uL (ref 4.22–5.81)
RDW: 13.8 % (ref 11.5–15.5)
WBC: 6.8 10*3/uL (ref 4.0–10.5)

## 2016-03-18 LAB — PSA: PSA: 0.8 ng/mL (ref 0.10–4.00)

## 2016-03-22 ENCOUNTER — Telehealth: Payer: Self-pay | Admitting: Medical

## 2016-03-22 NOTE — Telephone Encounter (Signed)
Got a message in epic about his abd xray. Did you advise him on the results of the xray.

## 2016-03-27 ENCOUNTER — Encounter: Payer: Self-pay | Admitting: Medical

## 2016-03-27 ENCOUNTER — Telehealth: Payer: Self-pay | Admitting: Medical

## 2016-03-27 DIAGNOSIS — N529 Male erectile dysfunction, unspecified: Secondary | ICD-10-CM | POA: Insufficient documentation

## 2016-03-27 DIAGNOSIS — R6882 Decreased libido: Secondary | ICD-10-CM | POA: Insufficient documentation

## 2016-03-27 NOTE — Progress Notes (Unsigned)
Received call fro [place] appointment scheduled for patien to be seen for FOllow up ED visit tomorrow at @ 2:30 pm with E. Saguier.

## 2016-03-27 NOTE — Telephone Encounter (Signed)
Caller name: Dr. Primus Bravo MD  Relation to pt: Urologist  Call back number:[phone]  Reason for call:  [facility]: Primus Bravo MD requesting Testosterone results please fax to [phone]

## 2016-03-27 NOTE — Telephone Encounter (Signed)
Sent message to KP in lab for results of last Testosterone Level. Will fax to Dr. Caesar Bookman office upon receipt.

## 2016-03-28 NOTE — Telephone Encounter (Signed)
Results faxed to Leory Plowman Stoneking,MD per request.

## 2016-03-28 NOTE — Telephone Encounter (Signed)
Pt testosterone studies were normal. Urologist probably already knows and told patient,

## 2016-03-30 ENCOUNTER — Telehealth: Payer: Self-pay | Admitting: Medical

## 2016-03-30 NOTE — Telephone Encounter (Signed)
Pt testosterone levels are normal. Will scan forms to chart.

## 2016-04-07 DIAGNOSIS — I10 Essential (primary) hypertension: Secondary | ICD-10-CM | POA: Diagnosis not present

## 2016-04-07 DIAGNOSIS — I340 Nonrheumatic mitral (valve) insufficiency: Secondary | ICD-10-CM | POA: Diagnosis not present

## 2016-04-07 DIAGNOSIS — I5022 Chronic systolic (congestive) heart failure: Secondary | ICD-10-CM | POA: Diagnosis not present

## 2016-04-09 NOTE — Telephone Encounter (Signed)
Left message for patient regarding lab results.

## 2016-05-28 ENCOUNTER — Encounter: Payer: Self-pay | Admitting: Medical

## 2016-05-28 ENCOUNTER — Ambulatory Visit (INDEPENDENT_AMBULATORY_CARE_PROVIDER_SITE_OTHER): Payer: Medicare Other | Admitting: Medical

## 2016-05-28 ENCOUNTER — Ambulatory Visit (HOSPITAL_BASED_OUTPATIENT_CLINIC_OR_DEPARTMENT_OTHER)
Admission: RE | Admit: 2016-05-28 | Discharge: 2016-05-28 | Disposition: A | Discharge status: Home / Self Care | Payer: Medicare Other | Source: Ambulatory Visit | Attending: Medical | Admitting: Medical

## 2016-05-28 VITALS — BP 150/90 | HR 81 | Temp 98.1°F | Ht 69.0 in | Wt 198.8 lb

## 2016-05-28 DIAGNOSIS — R062 Wheezing: Secondary | ICD-10-CM

## 2016-05-28 DIAGNOSIS — R05 Cough: Secondary | ICD-10-CM | POA: Insufficient documentation

## 2016-05-28 DIAGNOSIS — R059 Cough, unspecified: Secondary | ICD-10-CM

## 2016-05-28 DIAGNOSIS — R195 Other fecal abnormalities: Secondary | ICD-10-CM

## 2016-05-28 DIAGNOSIS — I10 Essential (primary) hypertension: Secondary | ICD-10-CM

## 2016-05-28 DIAGNOSIS — J209 Acute bronchitis, unspecified: Secondary | ICD-10-CM

## 2016-05-28 MED ORDER — DOXYCYCLINE HYCLATE 100 MG PO TABS: 100.0000 mg | ORAL_TABLET | Freq: Two times a day (BID) | ORAL | 0 refills | Status: DC

## 2016-05-28 MED ORDER — ALBUTEROL SULFATE HFA 108 (90 BASE) MCG/ACT IN AERS: 2.0000 | INHALATION_SPRAY | Freq: Four times a day (QID) | RESPIRATORY_TRACT | 0 refills | Status: DC | PRN

## 2016-05-28 MED ORDER — IPRATROPIUM BROMIDE HFA 17 MCG/ACT IN AERS: 2.0000 | INHALATION_SPRAY | Freq: Four times a day (QID) | RESPIRATORY_TRACT | 12 refills | Status: DC | PRN

## 2016-05-28 MED FILL — VENTOLIN HFA 90 MCG INHALER: 108 (90 BAS | 30 days supply | Qty: 18 | Fill #0

## 2016-05-28 NOTE — Patient Instructions (Signed)
For your cough and wheezing, continue dulera and will add albuterol(to use only if needed every 4-6 hours). Also will add atrovent inhaler.  For possible early bronchitis past week will rx doxycycline antibiotic. To use if needed over weekend. Please get cxr today.  For loose stools intermittent since trip will order stool panel studies.  For htn continue your current regimen cardiologist has you on. Advise weight loss as this has helped your bp as well.  Follow up in 7-10 days or as needed
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
## 2016-06-10 ENCOUNTER — Telehealth: Payer: Self-pay | Admitting: Medical

## 2016-06-10 NOTE — Telephone Encounter (Signed)
Dicussed and clarified with pt his cxr. He will be in tomorrow for follow up.

## 2016-06-10 NOTE — Telephone Encounter (Signed)
Caller name: Siler  Relation to pt: self Call back number: [phone] Pharmacy:  Reason for call: Pt came in office with some concerns of a letter received from our office with x rays results stated some issues with his heart, pt is really worried about his heart size and has some questions to ask, pt would like for nurse or PCP to call him to ask a few question. Pt also made appt for tomorrow to see PCP. Please advise ASAP.

## 2016-06-11 ENCOUNTER — Ambulatory Visit (INDEPENDENT_AMBULATORY_CARE_PROVIDER_SITE_OTHER): Payer: Medicare Other | Admitting: Medical

## 2016-06-11 ENCOUNTER — Encounter: Payer: Self-pay | Admitting: Medical

## 2016-06-11 VITALS — BP 143/88 | HR 74 | Temp 98.1°F | Resp 16 | Ht 69.0 in | Wt 199.5 lb

## 2016-06-11 DIAGNOSIS — J449 Chronic obstructive pulmonary disease, unspecified: Secondary | ICD-10-CM

## 2016-06-11 DIAGNOSIS — M545 Low back pain: Secondary | ICD-10-CM

## 2016-06-11 DIAGNOSIS — I10 Essential (primary) hypertension: Secondary | ICD-10-CM | POA: Diagnosis not present

## 2016-06-11 DIAGNOSIS — I429 Cardiomyopathy, unspecified: Secondary | ICD-10-CM | POA: Diagnosis not present

## 2016-06-11 LAB — COMPREHENSIVE METABOLIC PANEL
ALT: 40 U/L (ref 0–53)
AST: 29 U/L (ref 0–37)
Albumin: 4.6 g/dL (ref 3.5–5.2)
Alkaline Phosphatase: 66 U/L (ref 39–117)
BUN: 14 mg/dL (ref 6–23)
CO2: 30 mEq/L (ref 19–32)
Calcium: 9.7 mg/dL (ref 8.4–10.5)
Chloride: 102 mEq/L (ref 96–112)
Creatinine, Ser: 1.02 mg/dL (ref 0.40–1.50)
GFR: 80.15 mL/min (ref >60.00)
Glucose, Bld: 94 mg/dL (ref 70–99)
Potassium: 4.1 mEq/L (ref 3.5–5.1)
Sodium: 138 mEq/L (ref 135–145)
Total Bilirubin: 0.6 mg/dL (ref 0.2–1.2)
Total Protein: 7.4 g/dL (ref 6.0–8.3)

## 2016-06-11 LAB — POC URINALSYSI DIPSTICK (AUTOMATED)
Bilirubin, UA: NEGATIVE
Blood, UA: NEGATIVE
Glucose, UA: NEGATIVE
Ketones, UA: NEGATIVE
Leukocytes, UA: NEGATIVE
Nitrite, UA: NEGATIVE
Protein, UA: NEGATIVE
Spec Grav, UA: 1.02
Urobilinogen, UA: NEGATIVE
pH, UA: 6

## 2016-06-11 MED ORDER — ALBUTEROL SULFATE HFA 108 (90 BASE) MCG/ACT IN AERS: 2.0000 | INHALATION_SPRAY | Freq: Four times a day (QID) | RESPIRATORY_TRACT | 0 refills | Status: DC | PRN

## 2016-06-11 NOTE — Progress Notes (Signed)
Subjective:    Patient ID: Matthew Nixon, male    DOB: [birth year], 55 y.o.   MRN: [id number]  HPI  Pt in for follow up.  Pt reports that his prior Gi symptoms resolved and he never got stool test.   Pt bp is pretty good today. No cardiac and neurologic chest signs and symptoms. No significant symptom reported.  Pt states when he looses weight his bp is always better controlled. He states winter his bp will increase. I talked with pt yesterday and he is willing to get established with local cardiologist.    Pt has history of cardiomyopathy and he has cardiologist in [place]. I had referred to cardiologist in past but he did not go. I wanted him to have local one here in [place]. Pt now willing to go local. He still travels to [place] every 3 months. On review of his bp med list he is still on hydralazine 50 mg tid. On [facility] list but not on ours. But pt assures me he is taking.  Pt breathing is stable. No severe wheezing recently. He does bring me hand written note that states bronchiectasis pathognomonic signet ring. He states found on prior CT about one-2 years.  Pt did fill atrovent inhaler. He used to have albuterol but ran out.      Review of Systems  Constitutional: Negative for chills, fatigue and fever.  Respiratory: Negative for cough, chest tightness, shortness of breath and wheezing.        Stable.  Cardiovascular: Negative for chest pain and palpitations.  Gastrointestinal: Negative for abdominal pain, constipation, nausea and vomiting.       No history of colon cancer in family. Pt never had colonscopy done in past. Pt  has no black and no bloody stools in past.   Musculoskeletal: Negative for back pain and neck pain.  Skin: Negative for rash.  Neurological: Negative for dizziness and headaches.  Hematological: Negative for adenopathy. Does not bruise/bleed easily.  Psychiatric/Behavioral: Negative for behavioral problems, confusion, self-injury and suicidal ideas.     Past Medical History:  Diagnosis Date  . Allergy   . Cardiomyopathy ([place])   . COPD (chronic obstructive pulmonary disease) ([place])   . Hypertension   . Oxygen deficiency   . Sleep apnea      Social History   Social History  . Marital status: Married    Spouse name: N/A  . Number of children: N/A  . Years of education: N/A   Occupational History  . Not on file.   Social History Main Topics  . Smoking status: Never Smoker  . Smokeless tobacco: Never Used  . Alcohol use No  . Drug use: No  . Sexual activity: Not on file   Other Topics Concern  . Not on file   Social History Narrative   2 years college     Past Surgical History:  Procedure Laterality Date  . ANKLE SURGERY Right   . CARDIAC CATHETERIZATION    . HAND SURGERY Bilateral   . HAND SURGERY     Right  . HAND SURGERY     Left carpel tunnel  . THUMB ARTHROSCOPY      Family History  Problem Relation Age of Onset  . Heart disease Mother   . Heart disease Father     No Known Allergies  Current Outpatient Prescriptions on File Prior to Visit  Medication Sig Dispense Refill  . albuterol (PROVENTIL HFA;VENTOLIN HFA) 108 (90 Base) MCG/ACT inhaler  Inhale 2 puffs into the lungs every 6 (six) hours as needed for wheezing or shortness of breath. 1 Inhaler 0  . amLODipine (NORVASC) 10 MG tablet Take 10 mg by mouth daily.    [name] aspirin 81 MG tablet Take 81 mg by mouth daily.    . carvedilol (COREG) 25 MG tablet Take 25 mg by mouth 2 (two) times daily with a meal.    . cyclobenzaprine (FLEXERIL) 10 MG tablet Take 1 tablet (10 mg total) by mouth at bedtime. 14 tablet 0  . digoxin (LANOXIN) 0.125 MG tablet Take by mouth daily.    [name] doxazosin (CARDURA) 8 MG tablet Take 8 mg by mouth daily.    [name] doxycycline (VIBRA-TABS) 100 MG tablet Take 1 tablet (100 mg total) by mouth 2 (two) times daily. Can give caps or generic 20 tablet 0  . hydrALAZINE (APRESOLINE) 50 MG tablet Take 50 mg by mouth 3 (three) times daily.     . hydrochlorothiazide (HYDRODIURIL) 50 MG tablet Take 1 tablet (50 mg total) by mouth daily. 30 tablet 3  . ipratropium (ATROVENT HFA) 17 MCG/ACT inhaler Inhale 2 puffs into the lungs every 6 (six) hours as needed for wheezing. 1 Inhaler 12  . ISOSORBIDE MONONITRATE PO Take 60 mg by mouth 2 (two) times daily.    . mometasone-formoterol (DULERA) 200-5 MCG/ACT AERO Inhale 2 puffs into the lungs 2 (two) times daily.    . quinapril (ACCUPRIL) 40 MG tablet Take 40 mg by mouth daily.    [name] spironolactone (ALDACTONE) 25 MG tablet Take 25 mg by mouth daily.     No current facility-administered medications on file prior to visit.     BP (!) 143/88   Pulse 74   Temp 98.1 F (36.7 C) (Oral)   Resp 16   Ht '5\' 9"'$  (1.753 m)   Wt 199 lb 8 oz (90.5 kg)   SpO2 97%   BMI 29.46 kg/m       Objective:   Physical Exam  General Mental Status- Alert. General Appearance- Not in acute distress.   Skin General: Color- Normal Color. Moisture- Normal Moisture.  Neck Carotid Arteries- Normal color. Moisture- Normal Moisture. No carotid bruits. No JVD.  Chest and Lung Exam Auscultation: Breath Sounds:-Normal.  Cardiovascular Auscultation:Rythm- Regular. Murmurs & Other Heart Sounds:Auscultation of the heart reveals- No Murmurs.  Abdomen Inspection:-Inspeection Normal. Palpation/Percussion:Note:No mass. Palpation and Percussion of the abdomen reveal- Non Tender, Non Distended + BS, no rebound or guarding.    Neurologic Cranial Nerve exam:- CN III-XII intact(No nystagmus), symmetric smile. Strength:- 5/5 equal and symmetric strength both upper and lower extremities.   Lower ext- no pedal edema. Negative homans.    Assessment & Plan:  Your bp is relatively  well controlled today as it has been higher in the past. Continue all your current bp meds. Including hydralazine as we discussed. Will get cmp today.  With your history of cardiomyopathy. Watch your daily weights. If weight gradually  increasing with pedal edema or if rapid weight gain more than 3 lb in any one day then be seen would get cxr and bnp in that event. Refer to establish local cardiologist again.  For your wheezing and copd hx continue atrovent and rx abluterol to use if needed. Might refer you to local pulmonologist.(Will follow breathing and 02 sat make referral if worsens)  Follow up in 2 month or as needed  Pt has cologuard material. He will review and let us know if he wants to  proceed.  Regarding back pain advise update if pain worsens or changes. See back pain and comments.  Saguier, Percell Miller, PA-C

## 2016-06-11 NOTE — Patient Instructions (Addendum)
Your bp is relatively  well controlled today as it has been higher in the past. Continue all your current bp meds. Including hydralazine as we discussed. Will get cmp today.  With your history of cardiomyopathy. Watch your daily weights. If weight gradually increasing with pedal edema or if rapid weight gain more than 3 lb in any one day then be seen would get cxr and bnp in that event. Refer to establish local cardiologist again.  For your wheezing and copd hx continue atrovent and rx abluterol to use if needed. Might refer you to local pulmonologist.  Follow up in 2 month or as needed

## 2016-06-11 NOTE — Progress Notes (Signed)
Pre visit review using our clinic review tool, if applicable. No additional management support is needed unless otherwise documented below in the visit note/SLS  

## 2016-07-17 ENCOUNTER — Ambulatory Visit: Payer: Self-pay | Admitting: Cardiology

## 2016-07-18 ENCOUNTER — Telehealth: Payer: Self-pay | Admitting: *Deleted

## 2016-07-18 ENCOUNTER — Encounter: Payer: Self-pay | Admitting: Cardiology

## 2016-07-18 NOTE — Telephone Encounter (Signed)
Pt walked in today and s/w Vashti Hey, stated thought appointment was today.  Stated did not hear correct time on vm.  Did not want to reschedule.

## 2016-08-07 ENCOUNTER — Ambulatory Visit (INDEPENDENT_AMBULATORY_CARE_PROVIDER_SITE_OTHER): Payer: Medicare Other | Admitting: Cardiology

## 2016-08-07 ENCOUNTER — Encounter: Payer: Self-pay | Admitting: Cardiology

## 2016-08-07 VITALS — BP 150/100 | HR 94 | Ht 69.0 in | Wt 202.0 lb

## 2016-08-07 DIAGNOSIS — I420 Dilated cardiomyopathy: Secondary | ICD-10-CM

## 2016-08-07 DIAGNOSIS — I447 Left bundle-branch block, unspecified: Secondary | ICD-10-CM | POA: Diagnosis not present

## 2016-08-07 DIAGNOSIS — I10 Essential (primary) hypertension: Secondary | ICD-10-CM | POA: Diagnosis not present

## 2016-08-07 DIAGNOSIS — I5022 Chronic systolic (congestive) heart failure: Secondary | ICD-10-CM

## 2016-08-07 DIAGNOSIS — Z9989 Dependence on other enabling machines and devices: Secondary | ICD-10-CM | POA: Diagnosis not present

## 2016-08-07 DIAGNOSIS — G4733 Obstructive sleep apnea (adult) (pediatric): Secondary | ICD-10-CM

## 2016-08-07 HISTORY — DX: Obstructive sleep apnea (adult) (pediatric): G47.33

## 2016-08-07 HISTORY — DX: Chronic systolic (congestive) heart failure: I50.22

## 2016-08-07 NOTE — Patient Instructions (Signed)
Medication Instructions:  Your physician recommends that you continue on your current medications as directed. Please refer to the Current Medication list given to you today.   Labwork: None  Testing/Procedures: Dr. Radford Pax recommends you have a 24 hour BP monitor.  Follow-Up: Your physician wants you to follow-up in: 6 months with Dr. Radford Pax. You will receive a reminder letter in the mail two months in advance. If you don't receive a letter, please call our office to schedule the follow-up appointment.   Any Other Special Instructions Will Be Listed Below (If Applicable). Gae Bon is our office CPAP assistant. Her direct number is [phone].    If you need a refill on your cardiac medications before your next appointment, please call your pharmacy.

## 2016-08-07 NOTE — Progress Notes (Addendum)
Cardiology Office Note    Date:  08/21/2016   ID:  Matthew Nixon, DOB 1960/08/17, MRN [id number]  PCP:  Matthew Pai, PA-C  Cardiologist:  Matthew Him, MD   Chief Complaint  Patient presents with  . Cardiomyopathy  . Hypertension  . Sleep Apnea    History of Present Illness:  Matthew Nixon is a 56 y.o. male with history of DCM with EF 32% (echo 2014) and no ischemia on nuclear stress test who is here to establish cardiac care. He has chronic DOE that is stable for Nixon.  He notices that when he gains weight his SOB gets worse and when he loses weight it gets better. He has chronic chest pressure that he has had since his DCM diagnosis that has not changed recently.  He denies any LE edema, dizziness or syncope. He occasionally feels a skipped heart beat at night. He has a history of OSA and is on CPAP but has not been able to use his device because the mask is leaking.  His device is at least 57-44 years old.     Past Medical History:  Diagnosis Date  . Allergy   . Cardiomyopathy ([place])    DCM with EF 32% on echo 2014 and no ischemia on nuclear stress test  . Chronic systolic CHF (congestive heart failure), NYHA class 2 ([place]) 08/07/2016  . COPD (chronic obstructive pulmonary disease) ([place])   . Hypertension   . LBBB (left bundle branch block)   . OSA on CPAP 08/07/2016  . Oxygen deficiency   . Sleep apnea     Past Surgical History:  Procedure Laterality Date  . ANKLE SURGERY Right   . CARDIAC CATHETERIZATION    . HAND SURGERY Bilateral   . HAND SURGERY     Right  . HAND SURGERY     Left carpel tunnel  . THUMB ARTHROSCOPY      Current Medications: Current Meds  Medication Sig  . albuterol (PROVENTIL HFA;VENTOLIN HFA) 108 (90 Base) MCG/ACT inhaler Inhale 2 puffs into the lungs every 6 (six) hours as needed for wheezing or shortness of breath.  [name] amLODipine (NORVASC) 10 MG tablet Take 10 mg by mouth daily.  [name] aspirin 81 MG tablet Take 81 mg by mouth daily.  .  carvedilol (COREG) 25 MG tablet Take 25 mg by mouth 2 (two) times daily with a meal.  . cyclobenzaprine (FLEXERIL) 10 MG tablet Take 1 tablet (10 mg total) by mouth at bedtime.  . digoxin (LANOXIN) 0.125 MG tablet Take 0.125 mg by mouth daily.   [name] doxazosin (CARDURA) 8 MG tablet Take 8 mg by mouth daily.  [name] doxycycline (VIBRA-TABS) 100 MG tablet Take 1 tablet (100 mg total) by mouth 2 (two) times daily. Can give caps or generic  . hydrochlorothiazide (HYDRODIURIL) 50 MG tablet Take 1 tablet (50 mg total) by mouth daily.  [name] ipratropium (ATROVENT HFA) 17 MCG/ACT inhaler Inhale 2 puffs into the lungs every 6 (six) hours as needed for wheezing.  . ISOSORBIDE MONONITRATE PO Take 60 mg by mouth 2 (two) times daily.  . mometasone-formoterol (DULERA) 200-5 MCG/ACT AERO Inhale 2 puffs into the lungs 2 (two) times daily.  . quinapril (ACCUPRIL) 40 MG tablet Take 40 mg by mouth daily.  [name] spironolactone (ALDACTONE) 25 MG tablet Take 25 mg by mouth daily.  . [DISCONTINUED] hydrALAZINE (APRESOLINE) 50 MG tablet Take 50 mg by mouth 3 (three) times daily.    Allergies:   Patient has no known allergies.  Social History   Social History  . Marital status: Married    Spouse name: N/A  . Number of children: N/A  . Years of education: N/A   Social History Main Topics  . Smoking status: Never Smoker  . Smokeless tobacco: Never Used  . Alcohol use No  . Drug use: No  . Sexual activity: Not Asked   Other Topics Concern  . None   Social History Narrative   2 years college      Family History:  The patient's family history includes Heart disease in his father and mother.   ROS:   Please see the history of present illness.    ROS All other systems reviewed and are negative.  No flowsheet data found.     PHYSICAL EXAM:   VS:  BP (!) 150/100   Pulse 94   Ht '5\' 9"'$  (1.753 m)   Wt 202 lb (91.6 kg)   BMI 29.83 kg/m    GEN: Well nourished, well developed, in no acute distress  HEENT: normal    Neck: no JVD, carotid bruits, or masses Cardiac: RRR; no murmurs, rubs, or gallops,no edema.  Intact distal pulses bilaterally.  Respiratory:  clear to auscultation bilaterally, normal work of breathing GI: soft, nontender, nondistended, + BS MS: no deformity or atrophy  Skin: warm and dry, no rash Neuro:  Alert and Oriented x 3, Strength and sensation are intact Psych: euthymic mood, full affect  Wt Readings from Last 3 Encounters:  08/07/16 202 lb (91.6 kg)  06/11/16 199 lb 8 oz (90.5 kg)  05/28/16 198 lb 12.8 oz (90.2 kg)      Studies/Labs Reviewed:   EKG:  EKG is not ordered today.    Recent Labs: 12/06/2015: Pro B Natriuretic peptide (BNP) 76.0 03/18/2016: Hemoglobin 16.9; Platelets 220.0 06/11/2016: ALT 40; BUN 14; Creatinine, Ser 1.02; Potassium 4.1; Sodium 138   Lipid Panel    Component Value Date/Time   CHOL 159 03/18/2016 0708   TRIG 118.0 03/18/2016 0708   HDL 38.90 (L) 03/18/2016 0708   CHOLHDL 4 03/18/2016 0708   VLDL 23.6 03/18/2016 0708   LDLCALC 96 03/18/2016 0708    Additional studies/ records that were reviewed today include:  Echo and nuclear stress test results    ASSESSMENT:    1. Dilated cardiomyopathy ([place])   2. Essential hypertension   3. Chronic systolic CHF (congestive heart failure), NYHA class 2 ([place])   4. LBBB (left bundle branch block)   5. OSA on CPAP      PLAN:  In order of problems listed above:  1. DCM with EF 32% by echo in 2014. He has not had any assessment since then.  Presumed hypertensive with no ischemia on nuclear stress test.  He says that he had an echo last summer 2. HTN - BP poorly controlled on current meds.  Continue amlodipine/BB/doxazosin/hydralazine/aldactone and ACE I.  Recent BMET stable. He says that he has not tolerated increased doses of hydralazine.  I will get a 24 hour BP monitor to assess need for further medication. 3. Chronic systolic CHF NYHA class IIa. He appears euvolemic on exam today and weight  is stable.  He will continue on ACE I/Imdur/Hydralazine/aldactone and diuretic.   4. LBBB - chronic 5. OSA - he has not bee able to use his CPAP recently because he is having a lot of problems with mask leaking.  He would like to get a new machine and mask.  I will get a  copy of his prior sleep study and titration to see if he qualifies for new machine    Medication Adjustments/Labs and Tests Ordered: Current medicines are reviewed at length with the patient today.  Concerns regarding medicines are outlined above.  Medication changes, Labs and Tests ordered today are listed in the Patient Instructions below.  Patient Instructions  Medication Instructions:  Your physician recommends that you continue on your current medications as directed. Please refer to the Current Medication list given to you today.   Labwork: None  Testing/Procedures: Dr. Radford Pax recommends you have a 24 hour BP monitor.  Follow-Up: Your physician wants you to follow-up in: 6 months with Dr. Radford Pax. You will receive a reminder letter in the mail two months in advance. If you don't receive a letter, please call our office to schedule the follow-up appointment.   Any Other Special Instructions Will Be Listed Below (If Applicable). Gae Bon is our office CPAP assistant. Her direct number is [phone].    If you need a refill on your cardiac medications before your next appointment, please call your pharmacy.      Signed, Matthew Him, MD  08/21/2016 12:15 PM    [address], [place], [place]  [address] Phone: [phone]; Fax: [phone]

## 2016-08-15 ENCOUNTER — Ambulatory Visit (INDEPENDENT_AMBULATORY_CARE_PROVIDER_SITE_OTHER): Payer: Medicare Other

## 2016-08-15 DIAGNOSIS — I10 Essential (primary) hypertension: Secondary | ICD-10-CM | POA: Diagnosis not present

## 2016-08-18 ENCOUNTER — Telehealth: Payer: Self-pay

## 2016-08-18 NOTE — Telephone Encounter (Signed)
Ambulatory BP report received.  Per Dr. Radford Pax, patient to be referred to HTN Clinic for average BP 172/91.  Per Jinny Blossom, [facility], patient will increase Hydralazine to 100 mg TID and have HTN Clinic appointment 3/8.

## 2016-08-19 ENCOUNTER — Telehealth: Payer: Self-pay | Admitting: *Deleted

## 2016-08-19 NOTE — Telephone Encounter (Signed)
Left message to call back  

## 2016-08-19 NOTE — Telephone Encounter (Signed)
-----   Message from Theodoro Parma, RN sent at 08/15/2016  2:49 PM EST ----- Regarding: Sleep information We saw this patient last week. The number I found online is: [phone]  You will need to: 1) confirm he is a patient there 2) request a copy of his sleep study and titration AND where he was set up for PAP device/supplies.   The patient states he has gotten supplies from [facility]. I called them and they said he is NOT in the system. He told me they have told him he's not in the system as well. You may have to get the number from the [facility] in [place], [place] and call them. Calling the local might be easier.   Once you find where he is established, please see if he is eligible for a new machine.  Thanks!

## 2016-08-19 NOTE — Telephone Encounter (Signed)
[facility] and spoke to [place] who confirmed he is a patient there. I requested a copy of his sleep study and titration and who his DME is as well as . Willette Cluster said she has to send a request to another office and she took my fax number and phone number to give to that office

## 2016-08-20 MED ORDER — HYDRALAZINE HCL 100 MG PO TABS: 100.0000 mg | ORAL_TABLET | Freq: Three times a day (TID) | ORAL | 11 refills | Status: DC

## 2016-08-20 NOTE — Telephone Encounter (Signed)
Follow Up ° ° ° ° ° ° ° °Pt returning phone call °

## 2016-08-20 NOTE — Telephone Encounter (Signed)
Patient is returning call. Thanks.

## 2016-08-20 NOTE — Telephone Encounter (Signed)
Instructed patient to INCREASE HYDRALAZINE to 100 mg TID. HTN Clinic OV scheduled 3/12. Patient agrees with treatment plan.

## 2016-08-21 ENCOUNTER — Telehealth: Payer: Self-pay | Admitting: *Deleted

## 2016-08-21 NOTE — Telephone Encounter (Signed)
I called and spoke to the patient and he states his machine is not broken but that it just does not help him to sleep. He got set up on the machine in 2015 so he is not eligible for a new one for three more years. He wakes up constantly and it makes a loud noise and vibrates keeping his wife awake and he only sleeps for three hours a night. So he quit using the machine for more than a year and a half now. He feels his sleep apnea has gotten worse and he would like a sleep study done.

## 2016-08-28 DIAGNOSIS — I5022 Chronic systolic (congestive) heart failure: Secondary | ICD-10-CM

## 2016-08-28 NOTE — Progress Notes (Signed)
This encounter was created in error - please disregard.

## 2016-08-29 ENCOUNTER — Telehealth: Payer: Self-pay | Admitting: *Deleted

## 2016-08-29 NOTE — Telephone Encounter (Signed)
Talked to the patient today, patient says he is sleeping worse with the machine he has and he feels he needs a another sleep study not another machine. He says he is a 9-11 responder and suffers from COPD and he feels this maybe adding to the problem

## 2016-08-29 NOTE — Telephone Encounter (Deleted)
-----   Message from Sueanne Margarita, MD sent at 08/21/2016 12:18 PM EST ----- Here is stleep study

## 2016-08-29 NOTE — Telephone Encounter (Deleted)
-----   Message from Sueanne Margarita, MD sent at 08/21/2016 12:17 PM EST ----- Gae Bon, here is the sleep records you wanted to see about getting him a new macine

## 2016-08-29 NOTE — Telephone Encounter (Signed)
-----   Message from Sueanne Margarita, MD sent at 08/21/2016 12:18 PM EST ----- Here is stleep study

## 2016-09-01 ENCOUNTER — Ambulatory Visit: Payer: Self-pay

## 2016-09-01 ENCOUNTER — Other Ambulatory Visit: Payer: Self-pay

## 2016-09-01 ENCOUNTER — Telehealth: Payer: Self-pay | Admitting: *Deleted

## 2016-09-01 DIAGNOSIS — G4733 Obstructive sleep apnea (adult) (pediatric): Secondary | ICD-10-CM

## 2016-09-01 NOTE — Telephone Encounter (Signed)
-----   Message from Sueanne Margarita, MD sent at 08/29/2016  6:23 PM EST ----- OK to order split night study and have him take his machine to his DME to get it checked

## 2016-09-01 NOTE — Progress Notes (Deleted)
Patient ID: Matthew Nixon                 DOB: [birth year]                      MRN: [id number]     HPI: Matthew Nixon is a 56 y.o. male referred by Dr. Radford Pax to HTN clinic. PMH is signficant for DCM with LVEF 32% (echo 2014), chronic chest pressure, resistant HTN, and OSA on CPAP. Pt wore an ambulatory BP cuff which showed an average BP of 172/91. Her hydralazine was increased to '100mg'$  TID and she presents today for follow up.  Change hctz to chlorthalidone, increase spiro  Current HTN meds: amlodipine '10mg'$  daily, quinapril '40mg'$  daily, HCTZ '50mg'$  daily, spironolactone '25mg'$  daily, Imdur '60mg'$  BID, hydralazine '100mg'$  TID, doxazosin '8mg'$  daily, carvedilol '25mg'$  BID BP goal: <130/42mHg   Family History: Heart disease in his father and mother.  Social History: Denies tobacco, alcohol, and illicit drug use.  Diet:   Exercise:   Home BP readings:   Wt Readings from Last 3 Encounters:  08/07/16 202 lb (91.6 kg)  06/11/16 199 lb 8 oz (90.5 kg)  05/28/16 198 lb 12.8 oz (90.2 kg)   BP Readings from Last 3 Encounters:  08/07/16 (!) 150/100  06/11/16 (!) 143/88  05/28/16 (!) 150/90   Pulse Readings from Last 3 Encounters:  08/07/16 94  06/11/16 74  05/28/16 81    Renal function: CrCl cannot be calculated (Patient's most recent lab result is older than the maximum 21 days allowed.).  Past Medical History:  Diagnosis Date  . Allergy   . Cardiomyopathy ([place]    DCM with EF 32% on echo 2014 and no ischemia on nuclear stress test  . Chronic systolic CHF (congestive heart failure), NYHA class 2 ([place] 08/07/2016  . COPD ([place]ronic obstructive pulmonary disease) ([place]   . Hypertension   . LBBB (left bundle branch block)   . OSA on CPAP 08/07/2016  . Oxygen deficiency   . Sleep apnea     Current Outpatient Prescriptions on File Prior to Visit  Medication Sig Dispense Refill  . albuterol (PROVENTIL HFA;VENTOLIN HFA) 108 (90 Base) MCG/ACT inhaler Inhale 2 puffs into the lungs every 6  (six) hours as needed for wheezing or shortness of breath. 1 Inhaler 0  . amLODipine (NORVASC) 10 MG tablet Take 10 mg by mouth daily.    .Marland Kitchenaspirin 81 MG tablet Take 81 mg by mouth daily.    . carvedilol (COREG) 25 MG tablet Take 25 mg by mouth 2 (two) times daily with a meal.    . cyclobenzaprine (FLEXERIL) 10 MG tablet Take 1 tablet (10 mg total) by mouth at bedtime. 14 tablet 0  . digoxin (LANOXIN) 0.125 MG tablet Take 0.125 mg by mouth daily.     .Marland Kitchendoxazosin (CARDURA) 8 MG tablet Take 8 mg by mouth daily.    .Marland Kitchendoxycycline (VIBRA-TABS) 100 MG tablet Take 1 tablet (100 mg total) by mouth 2 (two) times daily. Can give caps or generic 20 tablet 0  . hydrALAZINE (APRESOLINE) 100 MG tablet Take 1 tablet (100 mg total) by mouth 3 (three) times daily. 90 tablet 11  . hydrochlorothiazide (HYDRODIURIL) 50 MG tablet Take 1 tablet (50 mg total) by mouth daily. 30 tablet 3  . ipratropium (ATROVENT HFA) 17 MCG/ACT inhaler Inhale 2 puffs into the lungs every 6 (six) hours as needed for wheezing. 1 Inhaler 12  . ISOSORBIDE MONONITRATE PO Take 60  mg by mouth 2 (two) times daily.    . mometasone-formoterol (DULERA) 200-5 MCG/ACT AERO Inhale 2 puffs into the lungs 2 (two) times daily.    . quinapril (ACCUPRIL) 40 MG tablet Take 40 mg by mouth daily.    [name] spironolactone (ALDACTONE) 25 MG tablet Take 25 mg by mouth daily.     No current facility-administered medications on file prior to visit.     No Known Allergies   Assessment/Plan:

## 2016-09-01 NOTE — Progress Notes (Signed)
Patient ID: Travoris Bushey                 DOB: 02/24/1961                      MRN: [id number]     HPI: Matthew Nixon is a 56 y.o. male patient of Dr. Radford Pax who presents today for hypertension evaluation. PMH includes DCM with EF 32% (echo 2014) and no ischemia on nuclear stress test. He has chronic chest pressure.   Today he presents stating that he has had HTN since being a first responder in 9-11. He reports that a lot of his breathing and heart conditions have developed secondary to inhalation of chemicals during 9-11. He was in [employer] organ[employer] at the time. 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   He reports he notices a difference in his blood pressure depending on how he allows his arm to hang during measurements. We discussed proper technique at length today.   He reports that he takes his medications as below. He states he does not miss doses but does take several of them late frequently.   Current HTN meds:  Spironolactone '25mg'$  daily Quinapril '40mg'$  daily Isosorbide mononitrate '60mg'$  BID Hydrochlorothiazide '50mg'$  daily Hydralazine '100mg'$  TID - has NOT been taking at all Doxazosin '8mg'$  daily - takes in evening when forget in the morning Carvedilol '25mg'$  BID - 6-7 am and pm Amlodipine '10mg'$  daily - in the evening when forgets in morning  Previously tried: nothing that he recalls  BP goal: <130/80  Family History: Mother - HTN and passed of MI at 37 yo. Father - smoker and passed away at 3 from MI. Oldest brother has high blood pressure.   Social History: Denies tobacco products and alcohol.   Diet: Most meals from home. Uses very little salt and seasonings. Drinks less than a half cup of coffee. Rarely drinks tea or soda.   Exercise: Walks and works out at gym when can. It depends on how he is feeling.   Home BP readings: his home cuff shows measurements in 170s-190s/70s-100s.   Wt Readings from Last 3 Encounters:  09/02/16 204 lb (92.5 kg)    08/07/16 202 lb (91.6 kg)  06/11/16 199 lb 8 oz (90.5 kg)   BP Readings from Last 3 Encounters:  09/02/16 (!) 184/102  08/07/16 (!) 150/100  06/11/16 (!) 143/88   Pulse Readings from Last 3 Encounters:  09/02/16 73  08/07/16 94  06/11/16 74    Renal function: CrCl cannot be calculated (Patient's most recent lab result is older than the maximum 21 days allowed.).  Past Medical History:  Diagnosis Date  . Allergy   . Cardiomyopathy ([place])    DCM with EF 32% on echo 2014 and no ischemia on nuclear stress test  . Chronic systolic CHF (congestive heart failure), NYHA class 2 ([place]) 08/07/2016  . COPD (chronic obstructive pulmonary disease) ([place])   . Hypertension   . LBBB (left bundle branch block)   . OSA on CPAP 08/07/2016  . Oxygen deficiency   . Sleep apnea     Current Outpatient Prescriptions on File Prior to Visit  Medication Sig Dispense Refill  . albuterol (PROVENTIL HFA;VENTOLIN HFA) 108 (90 Base) MCG/ACT inhaler Inhale 2 puffs into the lungs every 6 (six) hours as needed for wheezing or shortness of breath. 1 Inhaler 0  . amLODipine (NORVASC) 10 MG tablet Take 10 mg by mouth  daily.    . aspirin 81 MG tablet Take 81 mg by mouth daily.    . carvedilol (COREG) 25 MG tablet Take 25 mg by mouth 2 (two) times daily with a meal.    . cyclobenzaprine (FLEXERIL) 10 MG tablet Take 1 tablet (10 mg total) by mouth at bedtime. 14 tablet 0  . digoxin (LANOXIN) 0.125 MG tablet Take 0.125 mg by mouth daily.     [name] doxazosin (CARDURA) 8 MG tablet Take 8 mg by mouth daily.    [name] doxycycline (VIBRA-TABS) 100 MG tablet Take 1 tablet (100 mg total) by mouth 2 (two) times daily. Can give caps or generic 20 tablet 0  . ipratropium (ATROVENT HFA) 17 MCG/ACT inhaler Inhale 2 puffs into the lungs every 6 (six) hours as needed for wheezing. 1 Inhaler 12  . ISOSORBIDE MONONITRATE PO Take 60 mg by mouth 2 (two) times daily.    . mometasone-formoterol (DULERA) 200-5 MCG/ACT AERO Inhale 2 puffs into the  lungs 2 (two) times daily.    . quinapril (ACCUPRIL) 40 MG tablet Take 40 mg by mouth daily.    [name] spironolactone (ALDACTONE) 25 MG tablet Take 25 mg by mouth daily.     No current facility-administered medications on file prior to visit.     No Known Allergies  Blood pressure (!) 184/102, pulse 73, weight 204 lb (92.5 kg), SpO2 97 %.   Assessment/Plan: Hypertension: BP not at goal today. Discussed importance of getting BP to goal for heart health. Adherence to medication regimen was also discussed at length. He was provided a schedule for when to take medications (see patient instructions). Medication changes today - will decrease HCTZ dose to '25mg'$  daily as generally no added benefit with doses >'25mg'$  daily. Will also restart hydralazine at '50mg'$  TID. Follow up in HTN clinic in 3-4 weeks.    Thank you, Lelan Pons. Patterson Hammersmith, [place]

## 2016-09-02 ENCOUNTER — Ambulatory Visit (INDEPENDENT_AMBULATORY_CARE_PROVIDER_SITE_OTHER): Payer: Medicare Other | Admitting: Pharmacist

## 2016-09-02 VITALS — BP 184/102 | HR 73 | Wt 204.0 lb

## 2016-09-02 DIAGNOSIS — I10 Essential (primary) hypertension: Secondary | ICD-10-CM | POA: Diagnosis not present

## 2016-09-02 MED ORDER — HYDROCHLOROTHIAZIDE 25 MG PO TABS: 25.0000 mg | ORAL_TABLET | Freq: Every day | ORAL | 3 refills | Status: DC

## 2016-09-02 MED ORDER — HYDRALAZINE HCL 100 MG PO TABS: 50.0000 mg | ORAL_TABLET | Freq: Three times a day (TID) | ORAL | 11 refills | Status: DC

## 2016-09-02 NOTE — Patient Instructions (Addendum)
Return for a follow up appointment in 3-4 weeks.   Check your blood pressure at home daily (if able) and keep record of the readings.  Take your BP meds as follows: In the morning: Spironolactone '25mg'$  daily, Quinapril '40mg'$  daily, Isosorbide '60mg'$  (1st dose), carvedilol '25mg'$  (1st dose),  Hydrochlorothiazide '25mg'$  (decreased dose), Restart hydralazine '50mg'$  (1st dose)  In the mid-day: hydralazine '50mg'$  (2nd dose)  In the evening: Isosorbide '60mg'$  (2nd dose), doxazosin '8mg'$  daily, amlodipine '10mg'$  daily, carvedilol '25mg'$  (2nd dose), hydralazine '50mg'$  (3rd dose)    Bring all of your meds, your BP cuff and your record of home blood pressures to your next appointment.  Exercise as you're able, try to walk approximately 30 minutes per day.  Keep salt intake to a minimum, especially watch canned and prepared boxed foods.  Eat more fresh fruits and vegetables and fewer canned items.  Avoid eating in fast food restaurants.    HOW TO TAKE YOUR BLOOD PRESSURE: . Rest 5 minutes before taking your blood pressure. .  Don't smoke or drink caffeinated beverages for at least 30 minutes before. . Take your blood pressure before (not after) you eat. . Sit comfortably with your back supported and both feet on the floor (don't cross your legs). . Elevate your arm to heart level on a table or a desk. . Use the proper sized cuff. It should fit smoothly and snugly around your bare upper arm. There should be enough room to slip a fingertip under the cuff. The bottom edge of the cuff should be 1 inch above the crease of the elbow. . Ideally, take 3 measurements at one sitting and record the average.

## 2016-09-03 ENCOUNTER — Encounter: Payer: Self-pay | Admitting: Pharmacist

## 2016-09-09 ENCOUNTER — Telehealth: Payer: Self-pay | Admitting: *Deleted

## 2016-09-09 NOTE — Telephone Encounter (Signed)
Patient declined to schedule AWV at time of call. Stated he will call back to schedule.

## 2016-09-18 ENCOUNTER — Ambulatory Visit (INDEPENDENT_AMBULATORY_CARE_PROVIDER_SITE_OTHER): Payer: Medicare Other | Admitting: Medical

## 2016-09-18 ENCOUNTER — Encounter: Payer: Self-pay | Admitting: Medical

## 2016-09-18 ENCOUNTER — Telehealth: Payer: Self-pay | Admitting: Medical

## 2016-09-18 VITALS — BP 150/90 | HR 72 | Temp 98.2°F | Wt 199.0 lb

## 2016-09-18 DIAGNOSIS — R6882 Decreased libido: Secondary | ICD-10-CM

## 2016-09-18 DIAGNOSIS — I10 Essential (primary) hypertension: Secondary | ICD-10-CM | POA: Diagnosis not present

## 2016-09-18 DIAGNOSIS — R109 Unspecified abdominal pain: Secondary | ICD-10-CM

## 2016-09-18 DIAGNOSIS — I429 Cardiomyopathy, unspecified: Secondary | ICD-10-CM

## 2016-09-18 DIAGNOSIS — N529 Male erectile dysfunction, unspecified: Secondary | ICD-10-CM

## 2016-09-18 MED ORDER — RANITIDINE HCL 150 MG PO CAPS: 150.0000 mg | ORAL_CAPSULE | Freq: Two times a day (BID) | ORAL | 0 refills | Status: DC

## 2016-09-18 MED ORDER — ONDANSETRON 8 MG PO TBDP: 8.0000 mg | ORAL_TABLET | Freq: Three times a day (TID) | ORAL | 0 refills | Status: DC | PRN

## 2016-09-18 NOTE — Patient Instructions (Addendum)
For your htn and cardiomyopathy continue all your current bp meds. Bp was much better when I checked after resting long time. Will check bnp on Monday.  For abd pain will get various labs on Monday. Schedule [place] on Monday.   For ED I am going to refer for second opinion.  For gerd rx ranitidine. For nausea rx zofran.  I am going to ask our staff to help arranging cologuard.  Follow up in 2 weeks or as needed  If you have any severe signs and symptoms after hours then ED evaluation over long holiday weekend.

## 2016-09-18 NOTE — Telephone Encounter (Signed)
Would you help me order cologuard for this pt. I tried to find this in epic but could not find.

## 2016-09-18 NOTE — Progress Notes (Signed)
Pre visit review using our clinic review tool, if applicable. No additional management support is needed unless otherwise documented below in the visit note. 

## 2016-09-18 NOTE — Progress Notes (Signed)
Subjective:    Patient ID: Matthew Nixon, male    DOB: [birth year], 56 y.o.   MRN: [id number]  HPI  Pt states recently hydralizine 25 mg three time a day. He states former dose of 50 mg was too caused blurred vision and ha.  Pt states that last note form [facility] note is incorrect. Bottle states 25 mg tid. Pt is also on hctz 25 mg a day.  Pt is still on accupril 40 mg. Aldactone 25 mg a day, carvedilol 25 mg bid, amlodipine 10 mg a day.  Pt bp today better today than on 09-02-2016.  Pt states since last visit having Erectile dysfunction. No drive. For months no relations with wife. Pt expresses that he feels wife may me distrustful of him thinking he maybe having relations with other outside his marriage. Pt states that is not the case.  Tuesday morning he had nausea, chills and diarrhea. He tried to make himself vomit but could not. Pt states these symptoms resolved but since then his stomach is upset. He feels like some acid reflux.  He feels some bloated sensation at times.He states he thinks reflux on an off since 2009. He never really mentioned since he states was always handling other health problems.  Pt states in past and locally GI never wanted to do colonscopy in hospital due to his bp always being too high. But he states no one every set up the studies. He states he tried for numerous years to get colonoscopy done but no one ever scheduled the study due to htn.    Review of Systems  Constitutional: Negative for chills, fatigue and fever.  HENT: Negative for congestion, drooling and ear pain.   Respiratory: Negative for chest tightness, shortness of breath and wheezing.   Cardiovascular: Negative for chest pain, palpitations and leg swelling.  Gastrointestinal: Positive for abdominal distention, abdominal pain and nausea. Negative for anal bleeding, constipation, diarrhea and vomiting.       In past but not present now.  Genitourinary: Negative for difficulty urinating,  flank pain, frequency, hematuria, penile pain, scrotal swelling and urgency.  Musculoskeletal: Negative for arthralgias, back pain and joint swelling.  Skin: Negative for rash.  Neurological: Negative for dizziness, tremors, speech difficulty, weakness, numbness and headaches.  Hematological: Negative for adenopathy. Does not bruise/bleed easily.  Psychiatric/Behavioral: Positive for sleep disturbance. Negative for behavioral problems and confusion. The patient is not nervous/anxious.        Some stress about effect ED having on marriage.    Past Medical History:  Diagnosis Date  . Allergy   . Cardiomyopathy ([place])    DCM with EF 32% on echo 2014 and no ischemia on nuclear stress test  . Chronic systolic CHF (congestive heart failure), NYHA class 2 ([place]) 08/07/2016  . COPD (chronic obstructive pulmonary disease) ([place])   . Hypertension   . LBBB (left bundle branch block)   . OSA on CPAP 08/07/2016  . Oxygen deficiency   . Sleep apnea      Social History   Social History  . Marital status: Married    Spouse name: N/A  . Number of children: N/A  . Years of education: N/A   Occupational History  . Not on file.   Social History Main Topics  . Smoking status: Never Smoker  . Smokeless tobacco: Never Used  . Alcohol use No  . Drug use: No  . Sexual activity: Not on file   Other Topics Concern  . Not  on file   Social History Narrative   2 years college     Past Surgical History:  Procedure Laterality Date  . ANKLE SURGERY Right   . CARDIAC CATHETERIZATION    . HAND SURGERY Bilateral   . HAND SURGERY     Right  . HAND SURGERY     Left carpel tunnel  . THUMB ARTHROSCOPY      Family History  Problem Relation Age of Onset  . Heart disease Mother   . Heart disease Father     No Known Allergies  Current Outpatient Prescriptions on File Prior to Visit  Medication Sig Dispense Refill  . albuterol (PROVENTIL HFA;VENTOLIN HFA) 108 (90 Base) MCG/ACT inhaler Inhale 2  puffs into the lungs every 6 (six) hours as needed for wheezing or shortness of breath. 1 Inhaler 0  . amLODipine (NORVASC) 10 MG tablet Take 10 mg by mouth daily.    [name] aspirin 81 MG tablet Take 81 mg by mouth daily.    . carvedilol (COREG) 25 MG tablet Take 25 mg by mouth 2 (two) times daily with a meal.    . cetirizine (ZYRTEC) 10 MG tablet Take 10 mg by mouth daily.    . cyclobenzaprine (FLEXERIL) 10 MG tablet Take 1 tablet (10 mg total) by mouth at bedtime. 14 tablet 0  . digoxin (LANOXIN) 0.125 MG tablet Take 0.125 mg by mouth daily.     [name] doxazosin (CARDURA) 8 MG tablet Take 8 mg by mouth daily.    [name] doxycycline (VIBRA-TABS) 100 MG tablet Take 1 tablet (100 mg total) by mouth 2 (two) times daily. Can give caps or generic 20 tablet 0  . hydrALAZINE (APRESOLINE) 100 MG tablet Take 0.5 tablets (50 mg total) by mouth 3 (three) times daily. 90 tablet 11  . hydrochlorothiazide (HYDRODIURIL) 25 MG tablet Take 1 tablet (25 mg total) by mouth daily. 30 tablet 3  . ipratropium (ATROVENT HFA) 17 MCG/ACT inhaler Inhale 2 puffs into the lungs every 6 (six) hours as needed for wheezing. 1 Inhaler 12  . ISOSORBIDE MONONITRATE PO Take 60 mg by mouth 2 (two) times daily.    . mometasone-formoterol (DULERA) 200-5 MCG/ACT AERO Inhale 2 puffs into the lungs 2 (two) times daily.    . quinapril (ACCUPRIL) 40 MG tablet Take 40 mg by mouth daily.    [name] spironolactone (ALDACTONE) 25 MG tablet Take 25 mg by mouth daily.     No current facility-administered medications on file prior to visit.     BP (!) 169/83 (BP Location: Left Arm, Patient Position: Sitting, Cuff Size: Normal)   Pulse 72   Temp 98.2 F (36.8 C) (Oral)   Wt 199 lb (90.3 kg)   BMI 29.39 kg/m       Objective:   Physical Exam  General Mental Status- Alert. General Appearance- Not in acute distress.   Skin General: Color- Normal Color. Moisture- Normal Moisture.  Neck Carotid Arteries- Normal color. Moisture- Normal Moisture. No  carotid bruits. No JVD.  Chest and Lung Exam Auscultation: Breath Sounds:-Normal.  Cardiovascular Auscultation:Rythm- Regular. Murmurs & Other Heart Sounds:Auscultation of the heart reveals- No Murmurs.  Abdomen Inspection:-Inspeection Normal. Palpation/Percussion:Note:No mass. Palpation and Percussion of the abdomen reveal- Non Tender, Non Distended + BS, no rebound or guarding.    Neurologic Cranial Nerve exam:- CN III-XII intact(No nystagmus), symmetric smile. Drift Test:- No drift. Romberg Exam:- Negative.  Heal to Toe Gait exam:-Normal. Finger to Nose:- Normal/Intact Strength:- 5/5 equal and symmetric strength both upper  and lower extremities.      Assessment & Plan:  For your htn and cardiomyopathy continue all your current bp meds. Bp was much better when I checked after resting long time. Will check bnp on Monday.  For abd pain will get various labs on Monday. Schedule [place] on Monday.   For ED I am going to refer for second opinion.  For gerd rx ranitidine. For nausea rx zofran.  I am going to ask our staff to help arranging cologuard.  Follow up in 2 weeks or as needed  If you have any severe signs and symptoms after hours then ED evaluation over long holiday weekend.  [place], Percell Miller, PA-C

## 2016-09-22 ENCOUNTER — Ambulatory Visit (HOSPITAL_BASED_OUTPATIENT_CLINIC_OR_DEPARTMENT_OTHER)
Admission: RE | Admit: 2016-09-22 | Discharge: 2016-09-22 | Disposition: A | Discharge status: Home / Self Care | Payer: Medicare Other | Source: Ambulatory Visit | Attending: Medical | Admitting: Medical

## 2016-09-22 ENCOUNTER — Telehealth: Payer: Self-pay | Admitting: Medical

## 2016-09-22 ENCOUNTER — Other Ambulatory Visit (INDEPENDENT_AMBULATORY_CARE_PROVIDER_SITE_OTHER): Payer: Medicare Other

## 2016-09-22 DIAGNOSIS — R109 Unspecified abdominal pain: Secondary | ICD-10-CM

## 2016-09-22 DIAGNOSIS — K839 Disease of biliary tract, unspecified: Secondary | ICD-10-CM | POA: Diagnosis not present

## 2016-09-22 DIAGNOSIS — I77819 Aortic ectasia, unspecified site: Secondary | ICD-10-CM | POA: Diagnosis not present

## 2016-09-22 DIAGNOSIS — N200 Calculus of kidney: Secondary | ICD-10-CM | POA: Diagnosis not present

## 2016-09-22 DIAGNOSIS — I429 Cardiomyopathy, unspecified: Secondary | ICD-10-CM

## 2016-09-22 DIAGNOSIS — K760 Fatty (change of) liver, not elsewhere classified: Secondary | ICD-10-CM | POA: Diagnosis not present

## 2016-09-22 DIAGNOSIS — K828 Other specified diseases of gallbladder: Secondary | ICD-10-CM

## 2016-09-22 DIAGNOSIS — I10 Essential (primary) hypertension: Secondary | ICD-10-CM | POA: Diagnosis not present

## 2016-09-22 LAB — CBC WITH DIFFERENTIAL/PLATELET
Basophils Absolute: 0 10*3/uL (ref 0.0–0.1)
Basophils Relative: 0.3 % (ref 0.0–3.0)
Eosinophils Absolute: 0.2 10*3/uL (ref 0.0–0.7)
Eosinophils Relative: 2.1 % (ref 0.0–5.0)
HCT: 50.9 % (ref 39.0–52.0)
Hemoglobin: 17.4 g/dL — ABNORMAL HIGH (ref 13.0–17.0)
Lymphocytes Relative: 27.7 % (ref 12.0–46.0)
Lymphs Abs: 2.1 10*3/uL (ref 0.7–4.0)
MCHC: 34.1 g/dL (ref 30.0–36.0)
MCV: 93.6 fl (ref 78.0–100.0)
Monocytes Absolute: 0.7 10*3/uL (ref 0.1–1.0)
Monocytes Relative: 8.6 % (ref 3.0–12.0)
Neutro Abs: 4.7 10*3/uL (ref 1.4–7.7)
Neutrophils Relative %: 61.3 % (ref 43.0–77.0)
Platelets: 264 10*3/uL (ref 150.0–400.0)
RBC: 5.44 Mil/uL (ref 4.22–5.81)
RDW: 13.2 % (ref 11.5–15.5)
WBC: 7.6 10*3/uL (ref 4.0–10.5)

## 2016-09-22 LAB — COMPREHENSIVE METABOLIC PANEL
ALT: 37 U/L (ref 0–53)
AST: 22 U/L (ref 0–37)
Albumin: 4.1 g/dL (ref 3.5–5.2)
Alkaline Phosphatase: 67 U/L (ref 39–117)
BUN: 18 mg/dL (ref 6–23)
CO2: 32 mEq/L (ref 19–32)
Calcium: 9.6 mg/dL (ref 8.4–10.5)
Chloride: 103 mEq/L (ref 96–112)
Creatinine, Ser: 1.18 mg/dL (ref 0.40–1.50)
GFR: 67.68 mL/min (ref >60.00)
Glucose, Bld: 106 mg/dL — ABNORMAL HIGH (ref 70–99)
Potassium: 4.1 mEq/L (ref 3.5–5.1)
Sodium: 140 mEq/L (ref 135–145)
Total Bilirubin: 0.4 mg/dL (ref 0.2–1.2)
Total Protein: 7 g/dL (ref 6.0–8.3)

## 2016-09-22 LAB — AMYLASE: Amylase: 34 U/L (ref 27–131)

## 2016-09-22 LAB — LIPASE: Lipase: 23 U/L (ref 11.0–59.0)

## 2016-09-22 LAB — BRAIN NATRIURETIC PEPTIDE: Pro B Natriuretic peptide (BNP): 59 pg/mL (ref 0.0–100.0)

## 2016-09-22 NOTE — Telephone Encounter (Signed)
Attempted to reach pt and left message to have pt return my call.

## 2016-09-22 NOTE — Telephone Encounter (Signed)
Referral to GI placed

## 2016-09-22 NOTE — Telephone Encounter (Signed)
Patient received a called from automated system stating her could set up cologuard and her is interested in more information.  Due to high blood pressure. Please call patient back with more information call [phone]

## 2016-09-22 NOTE — Telephone Encounter (Signed)
Pt returned my call and stated that he was told by previous doctor that he should not have a colonoscopy due to his blood pressure. Pt wants to go forward with Cologuard. Order entered through portal and pt was advised he will received the kit in the mail or someone from [facility] may contact him first. Pt voices understanding.

## 2016-09-23 ENCOUNTER — Ambulatory Visit (HOSPITAL_BASED_OUTPATIENT_CLINIC_OR_DEPARTMENT_OTHER): Payer: Medicare Other | Attending: Cardiology | Admitting: Cardiology

## 2016-09-23 DIAGNOSIS — G4733 Obstructive sleep apnea (adult) (pediatric): Secondary | ICD-10-CM

## 2016-09-23 LAB — H. PYLORI BREATH TEST: H. pylori Breath Test: NOT DETECTED

## 2016-09-29 ENCOUNTER — Encounter: Payer: Self-pay | Admitting: Medical

## 2016-09-29 DIAGNOSIS — Z1212 Encounter for screening for malignant neoplasm of rectum: Secondary | ICD-10-CM | POA: Diagnosis not present

## 2016-09-29 DIAGNOSIS — Z1211 Encounter for screening for malignant neoplasm of colon: Secondary | ICD-10-CM | POA: Diagnosis not present

## 2016-09-29 NOTE — Progress Notes (Signed)
Patient ID: Matthew Nixon                 DOB: Nov 10, 1960                      MRN: [id number]     HPI: Matthew Nixon is a 56 y.o. male patient of Dr. Radford Pax who presents today for hypertension follow up. PMH includes DCM with EF 32% (echo 2014) and no ischemia on nuclear stress test. He has chronic chest pressure. 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 He was Event organiser at the time. At this visit his medication schedule was adjusted and HCTZ was decreased and hydralazine restarted.   He reports today and states that he has been doing well since our last visit. He again has questions about his medications and I question adherence based on these questions. He reports he was unsure what to do with HCTZ dose and took '25mg'$  BID (despite the directions on the bottle for once a day). He also reports that he never started the hydralazine because he thought he was supposed to take '10mg'$  BID and did not know how to make that dose with his tablet size. He states adherence to the rest of his medications. After further investigation he does report that he defers taking his morning medications until he eats his first meal (timing varies significantly). He reports he has not taken his morning medications yet today.   He repeatedly tells me that his pressure is "controlled" in the warm weather and uncontrolled in the cold. He states he knows his body and knows that his pressure will be at goal with more warm days coming. He is currently planning to move to [facility] to help with his overall health because he feels better when the weather is warm.   Current HTN meds:  Spironolactone '25mg'$  daily in the morning Quinapril '40mg'$  daily in the morning Isosorbide mononitrate '60mg'$  BID Hydrochlorothiazide '25mg'$  daily - last week took 1 BID Hydralazine '50mg'$  TID - has not been taking    Doxazosin '8mg'$  daily  Carvedilol '25mg'$  BID - 6-7 am and pm Amlodipine '10mg'$  daily in the morning  Previously tried: nothing that he recalls  BP goal: <130/80  Family History: Mother - HTN and passed of MI at 37 yo. Father - smoker and passed away at 68 from MI. Oldest brother has high blood pressure.   Social History: Denies tobacco products and alcohol.   Diet: Most meals from home. Uses very little salt and seasonings. Drinks less than a half cup of coffee. Rarely drinks tea or soda.   Exercise: Walks and works out at gym when can. It depends on how he is feeling.   Home BP readings: his home cuff shows measurements in 160s-170s/70s-90s. - he reports that he questions the accuracy of his cuff and it is a very old cuff.   Wt Readings from Last 3 Encounters:  09/23/16 199 lb (90.3 kg)  09/18/16 199 lb (90.3 kg)  09/02/16 204 lb (92.5 kg)   BP Readings from Last 3 Encounters:  09/30/16 (!) 136/98  09/18/16 (!) 150/90  09/02/16 (!) 184/102   Pulse Readings from Last 3 Encounters:  09/30/16 85  09/18/16 72  09/02/16 73    Renal function: Estimated Creatinine Clearance: 77.6 mL/min (by C-G formula based on SCr  of 1.18 mg/dL).  Past Medical History:  Diagnosis Date  . Allergy   . Cardiomyopathy ([place])    DCM with EF 32% on echo 2014 and no ischemia on nuclear stress test  . Chronic systolic CHF (congestive heart failure), NYHA class 2 ([place]) 08/07/2016  . COPD (chronic obstructive pulmonary disease) ([place])   . Hypertension   . LBBB (left bundle branch block)   . OSA on CPAP 08/07/2016  . Oxygen deficiency   . Sleep apnea     Current Outpatient Prescriptions on File Prior to Visit  Medication Sig Dispense Refill  . albuterol (PROVENTIL HFA;VENTOLIN HFA) 108 (90 Base) MCG/ACT inhaler Inhale 2 puffs into the lungs every 6 (six) hours as needed for wheezing or shortness of breath. 1 Inhaler 0  . amLODipine (NORVASC) 10 MG tablet Take 10 mg by mouth daily.    [name] aspirin 81 MG  tablet Take 81 mg by mouth daily.    . carvedilol (COREG) 25 MG tablet Take 25 mg by mouth 2 (two) times daily with a meal.    . cetirizine (ZYRTEC) 10 MG tablet Take 10 mg by mouth daily.    . cyclobenzaprine (FLEXERIL) 10 MG tablet Take 1 tablet (10 mg total) by mouth at bedtime. 14 tablet 0  . digoxin (LANOXIN) 0.125 MG tablet Take 0.125 mg by mouth daily.     [name] doxazosin (CARDURA) 8 MG tablet Take 8 mg by mouth daily.    [name] doxycycline (VIBRA-TABS) 100 MG tablet Take 1 tablet (100 mg total) by mouth 2 (two) times daily. Can give caps or generic 20 tablet 0  . hydrochlorothiazide (HYDRODIURIL) 25 MG tablet Take 1 tablet (25 mg total) by mouth daily. 30 tablet 3  . ipratropium (ATROVENT HFA) 17 MCG/ACT inhaler Inhale 2 puffs into the lungs every 6 (six) hours as needed for wheezing. 1 Inhaler 12  . ISOSORBIDE MONONITRATE PO Take 60 mg by mouth 2 (two) times daily.    . mometasone-formoterol (DULERA) 200-5 MCG/ACT AERO Inhale 2 puffs into the lungs 2 (two) times daily.    . ondansetron (ZOFRAN ODT) 8 MG disintegrating tablet Take 1 tablet (8 mg total) by mouth every 8 (eight) hours as needed for nausea or vomiting. 20 tablet 0  . quinapril (ACCUPRIL) 40 MG tablet Take 40 mg by mouth daily.    . ranitidine (ZANTAC) 150 MG capsule Take 1 capsule (150 mg total) by mouth 2 (two) times daily. 60 capsule 0  . spironolactone (ALDACTONE) 25 MG tablet Take 25 mg by mouth daily.    . hydrALAZINE (APRESOLINE) 100 MG tablet Take 0.5 tablets (50 mg total) by mouth 3 (three) times daily. (Patient not taking: Reported on 09/30/2016) 90 tablet 11   No current facility-administered medications on file prior to visit.     No Known Allergies  Blood pressure (!) 136/98, pulse 85.   Assessment/Plan: Hypertension: Pressure today is significantly better than before. His home cuff does measure about 30 mmHg different than my manual reading and it was advised he purchase new cuff. He refuses to make any medication  changes today, because he is sure his pressure will improve with the warmer weather. We did have a lengthy discussion on the importance of adherence to medications. A complete list of his BP meds was provided. He will work on compliance and will purchase new cuff to monitor. He prefers to call if pressure is not at goal and follow up with Dr. Radford Pax as scheduled.  He also requests  to speak with the nurse regarding his sleep study and this was facilitated for him.    Thank you, Lelan Pons. [place], [place]

## 2016-09-30 ENCOUNTER — Telehealth: Payer: Self-pay | Admitting: *Deleted

## 2016-09-30 ENCOUNTER — Encounter: Payer: Self-pay | Admitting: Pharmacist

## 2016-09-30 ENCOUNTER — Ambulatory Visit (INDEPENDENT_AMBULATORY_CARE_PROVIDER_SITE_OTHER): Payer: Medicare Other | Admitting: Pharmacist

## 2016-09-30 VITALS — BP 136/98 | HR 85

## 2016-09-30 DIAGNOSIS — I10 Essential (primary) hypertension: Secondary | ICD-10-CM

## 2016-09-30 DIAGNOSIS — G4733 Obstructive sleep apnea (adult) (pediatric): Secondary | ICD-10-CM

## 2016-09-30 DIAGNOSIS — Z9989 Dependence on other enabling machines and devices: Secondary | ICD-10-CM

## 2016-09-30 NOTE — Procedures (Signed)
   Patient Name: Matthew Nixon, Matthew Nixon Date: 09/23/2016 Gender: Male D.O.B: 03-27-1961 Age (years): 20 Referring Provider: Fransico Him MD, ABSM Height (inches): 69 Interpreting Physician: Fransico Him MD, ABSM Weight (lbs): 199 RPSGT: Madelon Lips BMI: 29 MRN: [id number] Neck Size: 17.00  CLINICAL INFORMATION Sleep Study Type: NPSG  Indication for sleep study: OSA  Epworth Sleepiness Score: 17  SLEEP STUDY TECHNIQUE As per the AASM Manual for the Scoring of Sleep and Associated Events v2.3 (April 2016) with a hypopnea requiring 4% desaturations.  The channels recorded and monitored were frontal, central and occipital EEG, electrooculogram (EOG), submentalis EMG (chin), nasal and oral airflow, thoracic and abdominal wall motion, anterior tibialis EMG, snore microphone, electrocardiogram, and pulse oximetry.  MEDICATIONS Medications self-administered by patient taken the night of the study : N/A  SLEEP ARCHITECTURE The study was initiated at 10:48:52 PM and ended at 4:49:22 AM.  Sleep onset time was 12.0 minutes and the sleep efficiency was 87.5%. The total sleep time was 315.5 minutes.  Stage REM latency was 94.5 minutes.  The patient spent 10.30% of the night in stage N1 sleep, 83.52% in stage N2 sleep, 0.00% in stage N3 and 6.18% in REM.  Alpha intrusion was absent.  Supine sleep was 10.28%.  RESPIRATORY PARAMETERS The overall apnea/hypopnea index (AHI) was 7.6 per hour. There were 5 total apneas, including 1 obstructive, 2 central and 2 mixed apneas. There were 35 hypopneas and 31 RERAs.  The AHI during Stage REM sleep was 27.7 per hour.  AHI while supine was 33.3 per hour.  The mean oxygen saturation was 91.93%. The minimum SpO2 during sleep was 86.00%.  Loud snoring was noted during this study.  CARDIAC DATA The 2 lead EKG demonstrated sinus rhythm. The mean heart rate was 61.10 beats per minute. Other EKG findings include: None.  LEG MOVEMENT  DATA The total PLMS were 0 with a resulting PLMS index of 0.00. Associated arousal with leg movement index was 0.0 .  IMPRESSIONS - Mild obstructive sleep apnea occurred during this study (AHI = 7.6/h). - No significant central sleep apnea occurred during this study (CAI = 0.4/h). - Mild oxygen desaturation was noted during this study (Min O2 = 86.00%). - The patient snored with Loud snoring volume. - No cardiac abnormalities were noted during this study. - Clinically significant periodic limb movements did not occur during sleep. No significant associated arousals.  DIAGNOSIS - Obstructive Sleep Apnea (327.23 [G47.33 ICD-10])  RECOMMENDATIONS - The patient has very mild OSA overall but severe during REM sleep and in the supine position with oxygen desaturations as low as 86% associated with severe snoring and high epworth sleepiness score.  Therefore, recommend in lab CPAP titration.  - Avoid alcohol, sedatives and other CNS depressants that may worsen sleep apnea and disrupt normal sleep architecture. - Sleep hygiene should be reviewed to assess factors that may improve sleep quality. - Weight management and regular exercise should be initiated or continued if appropriate  [place], American Board of Sleep Medicine  ELECTRONICALLY SIGNED ON:  09/30/2016, 2:42 PM [place] PH: [phone]   FX: [phone] [place]

## 2016-09-30 NOTE — Telephone Encounter (Signed)
Late Entry: Patient walked in today to inquire about his home sleep study. His sleep study had not yet been resulted but I told him I would contact him with the results when it came in. Patient asked if he could have a copy of his sleep study for his personal records. I had him sign a patient release form and told him I would contact him when the time came to pick up his copy.

## 2016-09-30 NOTE — Patient Instructions (Signed)
GOAL blood pressure is less than 130/80  Check your blood pressure at home daily (if able) and keep record of the readings.  Take your BP meds as follows: CONTINUE:  MORNING - spironolactone '25mg'$ , quinapril '40mg'$ , isosorbide '60mg'$ , hydrochlorothiazide '25mg'$ , carvedilol '25mg'$ , amlodipine '10mg'$   EVENING - Isosorbide '60mg'$ , doxazosin '8mg'$ , carvedilol '25mg'$     Bring all of your meds, your BP cuff and your record of home blood pressures to your next appointment.  Exercise as you're able, try to walk approximately 30 minutes per day.  Keep salt intake to a minimum, especially watch canned and prepared boxed foods.  Eat more fresh fruits and vegetables and fewer canned items.  Avoid eating in fast food restaurants.    HOW TO TAKE YOUR BLOOD PRESSURE: . Rest 5 minutes before taking your blood pressure. .  Don't smoke or drink caffeinated beverages for at least 30 minutes before. . Take your blood pressure before (not after) you eat. . Sit comfortably with your back supported and both feet on the floor (don't cross your legs). . Elevate your arm to heart level on a table or a desk. . Use the proper sized cuff. It should fit smoothly and snugly around your bare upper arm. There should be enough room to slip a fingertip under the cuff. The bottom edge of the cuff should be 1 inch above the crease of the elbow. . Ideally, take 3 measurements at one sitting and record the average.

## 2016-09-30 NOTE — Telephone Encounter (Signed)
Informed the patient of results and recommendations and he verbalized understanding. He understands he will be set up in sleep lab for a cpap titration. He understands he will be contacted by mail with a time and a date of the titration. He was grateful for the call and he agrees with the treatment. Patient will call with a time and day he wants to get a copy of his sleep study.

## 2016-09-30 NOTE — Telephone Encounter (Signed)
-----   Message from Sueanne Margarita, MD sent at 09/30/2016  2:46 PM EDT ----- Please let patient know that they have sleep apnea and recommend CPAP titration. Please set up titration in the sleep lab.

## 2016-10-03 LAB — COLOGUARD

## 2016-10-06 ENCOUNTER — Encounter: Payer: Self-pay | Admitting: *Deleted

## 2016-10-10 ENCOUNTER — Ambulatory Visit (INDEPENDENT_AMBULATORY_CARE_PROVIDER_SITE_OTHER): Payer: Medicare Other | Admitting: Internal Medicine

## 2016-10-10 ENCOUNTER — Encounter: Payer: Self-pay | Admitting: Internal Medicine

## 2016-10-10 VITALS — BP 160/74 | HR 88 | Ht 69.0 in | Wt 201.4 lb

## 2016-10-10 DIAGNOSIS — Z1211 Encounter for screening for malignant neoplasm of colon: Secondary | ICD-10-CM | POA: Diagnosis not present

## 2016-10-10 DIAGNOSIS — K219 Gastro-esophageal reflux disease without esophagitis: Secondary | ICD-10-CM | POA: Diagnosis not present

## 2016-10-10 DIAGNOSIS — R1011 Right upper quadrant pain: Secondary | ICD-10-CM | POA: Diagnosis not present

## 2016-10-10 DIAGNOSIS — R140 Abdominal distension (gaseous): Secondary | ICD-10-CM | POA: Diagnosis not present

## 2016-10-10 DIAGNOSIS — R112 Nausea with vomiting, unspecified: Secondary | ICD-10-CM | POA: Diagnosis not present

## 2016-10-10 MED ORDER — OMEPRAZOLE 40 MG PO CPDR: 40.0000 mg | DELAYED_RELEASE_CAPSULE | Freq: Every day | ORAL | 3 refills | Status: DC

## 2016-10-10 NOTE — Progress Notes (Signed)
HISTORY OF PRESENT ILLNESS:  Matthew Nixon is a 56 y.o. 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 Patient reports a greater than 10 year history of right upper quadrant pain described as sharp and stabbing. Initially intermittent. Now constant for greater than 2-1/2 months. No exacerbating or relieving factors. Did undergo ultrasound which showed sludge in the gallbladder. Outside radiology and laboratories reviewed. Blood work from earlier this month including upper hence a metabolic panel and CBC was unremarkable. Patient also reports chronic reflux symptoms without dysphagia. Symptoms despite ranitidine. No prior history of GI evaluations. He does have significant medical problems including cardiomyopathy with an ejection fraction less than 35%. Has a history of obstructive sleep apnea and COPD. His weight has been stable. He occasionally has nausea with vomiting and diarrhea episodically. He describes 3 such episodes over the past year. Associated with what sounds like chills or rhonchorous. Last episode 3 weeks ago. He states that his right upper quadrant is sore today. For colon cancer screening he elected on cologard with his PCP. This was submitted 2 weeks ago and is pending.  REVIEW OF SYSTEMS:  All non-GI ROS negative except for allergy, anxiety, back pain, cough, headaches, muscle cramps, sleeping problems, shortness of breath   Past Medical History:  Diagnosis Date  . Allergy   . Cardiomyopathy ([place])    DCM with EF 32% on echo 2014 and no ischemia on nuclear stress test  . Chronic systolic CHF (congestive heart failure), NYHA class 2 ([place]) 08/07/2016  . COPD (chronic obstructive pulmonary disease) ([place])   . Hypertension   . LBBB (left bundle branch block)   . OSA on CPAP 08/07/2016  . Oxygen deficiency   . Sleep apnea     Past Surgical History:  Procedure Laterality Date  . ANKLE SURGERY Right    . CARDIAC CATHETERIZATION    . HAND SURGERY Bilateral   . HAND SURGERY     Right  . HAND SURGERY     Left carpel tunnel  . THUMB ARTHROSCOPY      Social History [name]  reports that he has never smoked. He has never used smokeless tobacco. He reports that he does not drink alcohol or use drugs.  family history includes Heart disease in his father and mother.  No Known Allergies     PHYSICAL EXAMINATION: Vital signs: BP (!) 160/74   Pulse 88   Ht '5\' 9"'$  (1.753 m)   Wt 201 lb 6.4 oz (91.4 kg)   BMI 29.74 kg/m   Constitutional: generally well-appearing, no acute distress Psychiatric: alert and oriented x3, cooperative Eyes: extraocular movements intact, anicteric, conjunctiva pink Mouth: oral pharynx moist, no lesions Neck: supple without thyromegally Lymph:  no lymphadenopathy Cardiovascular: heart regular rate and rhythm, no murmur Lungs: clear to auscultation bilaterally Abdomen: soft, nontender, ruq tenderness with fullness, no rebound, no obvious ascites, no peritoneal signs, normal bowel sounds, no organomegaly Rectal: omitted Extremities: no clubbing cyanosis or  lower extremity edema bilaterally Skin: no lesions on visible extremities Neuro: No focal deficits. Normal DTR's  ASSESSMENT:  1. Chronic recurrent RUQ pain now persistent and worsening. Etiology unclear. Needs advanced imaging 2. GERD on h2RA 3. Multiple medical problems 4. Cologuard pending 5. Recurrent N,V,D with chills. ? cause  PLAN:  1. Contrast CT Abdomen and pelvis to evaluate worsening pain. R/o abscess, mass. 2. Prescribe omeprazole 40 mg daily 3. ? Hida scan  if negative 4. GI follow up 4-6 weeks 5. F/U Cologuard   Copy of consult note sent to [place], [place]

## 2016-10-10 NOTE — Patient Instructions (Addendum)
We have sent the following medications to your pharmacy for you to pick up at your convenience:  Omeprazole  You have been scheduled for a CT scan of the abdomen and pelvis at [facility] ([address]---this is in the same building as Press [facility]).   You are scheduled on 10/14/2016 at 3:00pm. You should arrive 15 minutes prior to your appointment time for registration. Please follow the written instructions below on the day of your exam:  WARNING: IF YOU ARE ALLERGIC TO IODINE/X-RAY DYE, PLEASE NOTIFY RADIOLOGY IMMEDIATELY AT [phone]! YOU WILL BE GIVEN A 13 HOUR PREMEDICATION PREP.  1) Do not eat or drink anything after 11:00am (4 hours prior to your test) 2) You have been given 2 bottles of oral contrast to drink. The solution may taste               better if refrigerated, but do NOT add ice or any other liquid to this solution. Shake             well before drinking.    Drink 1 bottle of contrast @ 1:00pm (2 hours prior to your exam)  Drink 1 bottle of contrast @ 2:00pm (1 hour prior to your exam)  You may take any medications as prescribed with a small amount of water except for the following: Metformin, Glucophage, Glucovance, Avandamet, Riomet, Fortamet, Actoplus Met, Janumet, Glumetza or Metaglip. The above medications must be held the day of the exam AND 48 hours after the exam.  The purpose of you drinking the oral contrast is to aid in the visualization of your intestinal tract. The contrast solution may cause some diarrhea. Before your exam is started, you will be given a small amount of fluid to drink. Depending on your individual set of symptoms, you may also receive an intravenous injection of x-ray contrast/dye. Plan on being at [facility] for 30 minutes or long, depending on the type of exam you are having performed.  If you have any questions regarding your exam or if you need to reschedule, you may call the CT department at [phone] between  the hours of 8:00 am and 5:00 pm, Monday-Friday.  ________________________________________________________________________   Please follow up with Dr. Henrene Pastor on 11/21/2016 at 8:30am

## 2016-10-14 ENCOUNTER — Ambulatory Visit (INDEPENDENT_AMBULATORY_CARE_PROVIDER_SITE_OTHER)
Admission: RE | Admit: 2016-10-14 | Discharge: 2016-10-14 | Disposition: A | Discharge status: Home / Self Care | Payer: Medicare Other | Source: Ambulatory Visit | Attending: Internal Medicine | Admitting: Internal Medicine

## 2016-10-14 DIAGNOSIS — R140 Abdominal distension (gaseous): Secondary | ICD-10-CM

## 2016-10-14 DIAGNOSIS — R1011 Right upper quadrant pain: Secondary | ICD-10-CM | POA: Diagnosis not present

## 2016-10-14 MED ORDER — IOPAMIDOL (ISOVUE-300) INJECTION 61%
100.0000 mL | Freq: Once | INTRAVENOUS | Status: AC | PRN
  Administered 2016-10-14: 100 mL via INTRAVENOUS

## 2016-10-15 ENCOUNTER — Other Ambulatory Visit: Payer: Self-pay

## 2016-10-16 ENCOUNTER — Encounter (HOSPITAL_BASED_OUTPATIENT_CLINIC_OR_DEPARTMENT_OTHER): Payer: Self-pay

## 2016-10-30 ENCOUNTER — Encounter: Payer: Self-pay | Admitting: Medical

## 2016-10-30 ENCOUNTER — Telehealth: Payer: Self-pay | Admitting: Medical

## 2016-10-30 ENCOUNTER — Ambulatory Visit (INDEPENDENT_AMBULATORY_CARE_PROVIDER_SITE_OTHER): Payer: Medicare Other | Admitting: Medical

## 2016-10-30 VITALS — BP 155/80 | HR 84 | Temp 98.7°F | Resp 16 | Ht 69.0 in | Wt 200.6 lb

## 2016-10-30 DIAGNOSIS — R05 Cough: Secondary | ICD-10-CM | POA: Diagnosis not present

## 2016-10-30 DIAGNOSIS — J479 Bronchiectasis, uncomplicated: Secondary | ICD-10-CM

## 2016-10-30 DIAGNOSIS — R059 Cough, unspecified: Secondary | ICD-10-CM

## 2016-10-30 DIAGNOSIS — I429 Cardiomyopathy, unspecified: Secondary | ICD-10-CM | POA: Diagnosis not present

## 2016-10-30 DIAGNOSIS — R109 Unspecified abdominal pain: Secondary | ICD-10-CM | POA: Diagnosis not present

## 2016-10-30 DIAGNOSIS — J449 Chronic obstructive pulmonary disease, unspecified: Secondary | ICD-10-CM | POA: Diagnosis not present

## 2016-10-30 DIAGNOSIS — I10 Essential (primary) hypertension: Secondary | ICD-10-CM

## 2016-10-30 DIAGNOSIS — R062 Wheezing: Secondary | ICD-10-CM | POA: Diagnosis not present

## 2016-10-30 MED ORDER — IPRATROPIUM BROMIDE HFA 17 MCG/ACT IN AERS: 2.0000 | INHALATION_SPRAY | Freq: Four times a day (QID) | RESPIRATORY_TRACT | 3 refills | Status: AC | PRN

## 2016-10-30 MED ORDER — ALBUTEROL SULFATE HFA 108 (90 BASE) MCG/ACT IN AERS: 2.0000 | INHALATION_SPRAY | Freq: Four times a day (QID) | RESPIRATORY_TRACT | 0 refills | Status: DC | PRN

## 2016-10-30 MED ORDER — HYDROCODONE-HOMATROPINE 5-1.5 MG/5ML PO SYRP: 5.0000 mL | ORAL_SOLUTION | Freq: Three times a day (TID) | ORAL | 0 refills | Status: DC | PRN

## 2016-10-30 MED ORDER — MOMETASONE FURO-FORMOTEROL FUM 200-5 MCG/ACT IN AERO: 2.0000 | INHALATION_SPRAY | Freq: Two times a day (BID) | RESPIRATORY_TRACT | 2 refills | Status: DC

## 2016-10-30 MED FILL — VENTOLIN HFA 90 MCG INHALER: 108 (90 BAS | 25 days supply | Qty: 18 | Fill #0

## 2016-10-30 MED FILL — HYDROCODONE-HOMATROPINE SYR: 5-1.5 | 8 days supply | Qty: 120 | Fill #0

## 2016-10-30 MED FILL — DULERA 200 MCG/5 MCG INH: 200-5 | 30 days supply | Qty: 13 | Fill #0

## 2016-10-30 NOTE — Telephone Encounter (Signed)
Taken care of by Durward Fortes, but onse the patient mails the Cologard in the company will fax results to the office. KMP

## 2016-10-30 NOTE — Progress Notes (Signed)
Subjective:    Patient ID: Matthew Nixon, male    DOB: 09/08/1960, 56 y.o.   MRN: [id number]  HPI  Pt in for follow up. Pt has seen GI MD. Pt still has the abdomen pain. Pt is supposed to have follow up. But he wants to know the cologuard result. Pt states his galbadder was evaluated and was not cause per pt. Pt states he did not have egd.  Pt was switched to omeprazole from ranitidine.  Gi also thinking of getting CT of abdomen and pelvis as well.  On review pt states  that since 2004 he has intermittent mild abdomen pain. But until recently gotten more intense and constant. Pt just now expresses a lot of concern about abdomen pain.  Pt states he has very strong dry cough over last 5 days. Pt states little more shortness of breath more so that usual. Recent a lot of cough at night. Pt weight is stable. Pt has been on dulera presently but not using regularly  since it is so expensive. Atrovent he only uses at night. He states cost of atrovent was $1000.   Pt states bp is getting better per pt. He state in summer his blood pressure each year is better. No cardiac or neurologic signs or symptoms.  Pt states 1st week fo this month allergies sneezing, itchy eyes and runny nose but then stopped. Pt took zyrtec and afrin(educated not to overuse). Pt not sure if is afrin. He thinks other type/maybe steroid?  Review of Systems  Constitutional: Negative for chills, fatigue and fever.  HENT: Positive for congestion. Negative for ear pain, rhinorrhea, sinus pain, sinus pressure, sneezing and sore throat.        Early in month.  Respiratory: Positive for cough, shortness of breath and wheezing.        At time will have sob with cough. Some wheezing at times but not severe.   Cough is the worst symptom.  Cardiovascular: Negative for chest pain and palpitations.  Gastrointestinal: Positive for abdominal pain. Negative for abdominal distention, anal bleeding, blood in stool, constipation, diarrhea,  nausea, rectal pain and vomiting.       More ruq. Not worse than before but persistent.  Genitourinary: Negative for enuresis, flank pain and frequency.  Musculoskeletal: Negative for back pain.  Skin: Negative for rash.  Neurological: Negative for dizziness, weakness and light-headedness.  Hematological: Negative for adenopathy. Does not bruise/bleed easily.  Psychiatric/Behavioral: Negative for confusion, dysphoric mood, self-injury and suicidal ideas. The patient is not nervous/anxious and is not hyperactive.     Past Medical History:  Diagnosis Date  . Allergy   . Cardiomyopathy ([place])    DCM with EF 32% on echo 2014 and no ischemia on nuclear stress test  . Chronic systolic CHF (congestive heart failure), NYHA class 2 ([place]) 08/07/2016  . COPD (chronic obstructive pulmonary disease) ([place])   . Hypertension   . LBBB (left bundle branch block)   . OSA on CPAP 08/07/2016  . Oxygen deficiency   . Sleep apnea      Social History   Social History  . Marital status: Married    Spouse name: N/A  . Number of children: N/A  . Years of education: N/A   Occupational History  . Not on file.   Social History Main Topics  . Smoking status: Never Smoker  . Smokeless tobacco: Never Used  . Alcohol use No  . Drug use: No  . Sexual activity: Not on file  Other Topics Concern  . Not on file   Social History Narrative   2 years college     Past Surgical History:  Procedure Laterality Date  . ANKLE SURGERY Right   . CARDIAC CATHETERIZATION    . HAND SURGERY Bilateral   . HAND SURGERY     Right  . HAND SURGERY     Left carpel tunnel  . THUMB ARTHROSCOPY      Family History  Problem Relation Age of Onset  . Heart disease Mother   . Heart disease Father   . Colon cancer Neg Hx   . Esophageal cancer Neg Hx   . Pancreatic cancer Neg Hx   . Stomach cancer Neg Hx     No Known Allergies  Current Outpatient Prescriptions on File Prior to Visit  Medication Sig Dispense  Refill  . albuterol (PROVENTIL HFA;VENTOLIN HFA) 108 (90 Base) MCG/ACT inhaler Inhale 2 puffs into the lungs every 6 (six) hours as needed for wheezing or shortness of breath. 1 Inhaler 0  . amLODipine (NORVASC) 10 MG tablet Take 10 mg by mouth daily.    [name] aspirin 81 MG tablet Take 81 mg by mouth daily.    . carvedilol (COREG) 25 MG tablet Take 25 mg by mouth 2 (two) times daily with a meal.    . cetirizine (ZYRTEC) 10 MG tablet Take 10 mg by mouth daily.    . cyclobenzaprine (FLEXERIL) 10 MG tablet Take 1 tablet (10 mg total) by mouth at bedtime. 14 tablet 0  . digoxin (LANOXIN) 0.125 MG tablet Take 0.125 mg by mouth daily.     [name] doxazosin (CARDURA) 8 MG tablet Take 8 mg by mouth daily.    [name] doxycycline (VIBRA-TABS) 100 MG tablet Take 1 tablet (100 mg total) by mouth 2 (two) times daily. Can give caps or generic 20 tablet 0  . hydrALAZINE (APRESOLINE) 100 MG tablet Take 0.5 tablets (50 mg total) by mouth 3 (three) times daily. 90 tablet 11  . hydrochlorothiazide (HYDRODIURIL) 25 MG tablet Take 1 tablet (25 mg total) by mouth daily. 30 tablet 3  . ipratropium (ATROVENT HFA) 17 MCG/ACT inhaler Inhale 2 puffs into the lungs every 6 (six) hours as needed for wheezing. 1 Inhaler 12  . ISOSORBIDE MONONITRATE PO Take 60 mg by mouth 2 (two) times daily.    . mometasone-formoterol (DULERA) 200-5 MCG/ACT AERO Inhale 2 puffs into the lungs 2 (two) times daily.    [name] omeprazole (PRILOSEC) 40 MG capsule Take 1 capsule (40 mg total) by mouth daily. 90 capsule 3  . ondansetron (ZOFRAN ODT) 8 MG disintegrating tablet Take 1 tablet (8 mg total) by mouth every 8 (eight) hours as needed for nausea or vomiting. 20 tablet 0  . quinapril (ACCUPRIL) 40 MG tablet Take 40 mg by mouth daily.    . ranitidine (ZANTAC) 150 MG capsule Take 1 capsule (150 mg total) by mouth 2 (two) times daily. 60 capsule 0  . spironolactone (ALDACTONE) 25 MG tablet Take 25 mg by mouth daily.     No current facility-administered medications  on file prior to visit.     BP (!) 160/80   Pulse 84   Temp 98.7 F (37.1 C) (Oral)   Resp 16   Ht '5\' 9"'$  (1.753 m)   Wt 200 lb 9.6 oz (91 kg)   SpO2 95%   BMI 29.62 kg/m       Objective:   Physical Exam  General Mental Status- Alert. General Appearance-  Not in acute distress.   Skin General: Color- Normal Color. Moisture- Normal Moisture.  Neck Carotid Arteries- Normal color. Moisture- Normal Moisture. No carotid bruits. No JVD.  Chest and Lung Exam Auscultation: Breath Sounds:-Normal.  Cardiovascular Auscultation:Rythm- Regular. Murmurs & Other Heart Sounds:Auscultation of the heart reveals- No Murmurs.  Abdomen Inspection:-Inspeection Normal. Palpation/Percussion:Note:No mass. Palpation and Percussion of the abdomen reveal- Non Tender, Non Distended + BS, no rebound or guarding.   Neurologic Cranial Nerve exam:- CN III-XII intact(No nystagmus), symmetric smile. Strength:- 5/5 equal and symmetric strength both upper and lower extremities.      Assessment & Plan:    Refer to pulmonologist for copd and pathognomonic signet ring on xray. Will rx dulera, and atrovent. Will refer to pulmonologist.   Make albuterol inhaler to make available as well.  For severe cough will rx hycodan  For abdomen pain continue omeprazole. We found  your cologuard test results(test was negative). I will refer you back to gastroenterologist.  Htn is stable compared to prior. 155/80 today. Continue current med regimen.  Hx of cardiomyopathy will get cxr, bnp and cmp.  Follow up 3-6 weeks or as needed  Note pt does have copd but in past reluctant to use prednisone in light of his cardiomyopathy and low EF.  Saguier, Percell Miller, PA-C

## 2016-10-30 NOTE — Telephone Encounter (Signed)
Do we have pt cologuard test results. Pt states done. Who follows those in our office and how are they resulted?

## 2016-10-30 NOTE — Patient Instructions (Addendum)
   Refer to pulmonologist for copd and pathognomonic signet ring on xray. Will rx dulera, and atrovent.. Will refer to pulmonologist.   Make albuterol inhaler to make available as well.  For severe cough will rx hycodan  For abdomen pain continue omeprazole. We found your  cologuard test results(test was negative). I will refer you back to gastroenterologist.  Htn is stable compared to prior. 155/80 today. Continue current med regimen.  Hx of cardiomyopathy will get cxr, bnp and cmp.  Follow up 3-6 weeks or as needed

## 2016-11-04 ENCOUNTER — Ambulatory Visit (HOSPITAL_BASED_OUTPATIENT_CLINIC_OR_DEPARTMENT_OTHER)
Admission: RE | Admit: 2016-11-04 | Discharge: 2016-11-04 | Disposition: A | Discharge status: Home / Self Care | Payer: Medicare Other | Source: Ambulatory Visit | Attending: Medical | Admitting: Medical

## 2016-11-04 DIAGNOSIS — R05 Cough: Secondary | ICD-10-CM | POA: Insufficient documentation

## 2016-11-04 DIAGNOSIS — J449 Chronic obstructive pulmonary disease, unspecified: Secondary | ICD-10-CM | POA: Diagnosis not present

## 2016-11-04 DIAGNOSIS — I700 Atherosclerosis of aorta: Secondary | ICD-10-CM | POA: Diagnosis not present

## 2016-11-04 DIAGNOSIS — I429 Cardiomyopathy, unspecified: Secondary | ICD-10-CM | POA: Diagnosis not present

## 2016-11-04 DIAGNOSIS — R059 Cough, unspecified: Secondary | ICD-10-CM

## 2016-11-21 ENCOUNTER — Ambulatory Visit (INDEPENDENT_AMBULATORY_CARE_PROVIDER_SITE_OTHER): Payer: Medicare Other | Admitting: Internal Medicine

## 2016-11-21 ENCOUNTER — Encounter: Payer: Self-pay | Admitting: Internal Medicine

## 2016-11-21 VITALS — BP 150/82 | HR 80 | Ht 67.5 in | Wt 200.0 lb

## 2016-11-21 DIAGNOSIS — K219 Gastro-esophageal reflux disease without esophagitis: Secondary | ICD-10-CM

## 2016-11-21 DIAGNOSIS — R1011 Right upper quadrant pain: Secondary | ICD-10-CM | POA: Diagnosis not present

## 2016-11-21 MED ORDER — OMEPRAZOLE 40 MG PO CPDR: 40.0000 mg | DELAYED_RELEASE_CAPSULE | Freq: Two times a day (BID) | ORAL | 3 refills | Status: DC

## 2016-11-21 NOTE — Patient Instructions (Signed)
We have sent the following medications to your pharmacy for you to pick up at your convenience: Omeprazole  You have been scheduled for a HIDA scan at [facility] (1st floor) on 11/26/2016. Please arrive 15 minutes prior to your scheduled appointment at  3:01SW. Make certain not to have anything to eat or drink at least 6 hours prior to your test. Should this appointment date or time not work well for you, please call radiology scheduling at [phone].  _____________________________________________________________________ hepatobiliary (HIDA) scan is an imaging procedure used to diagnose problems in the liver, gallbladder and bile ducts. In the HIDA scan, a radioactive chemical or tracer is injected into a vein in your arm. The tracer is handled by the liver like bile. Bile is a fluid produced and excreted by your liver that helps your digestive system break down fats in the foods you eat. Bile is stored in your gallbladder and the gallbladder releases the bile when you eat a meal. A special nuclear medicine scanner (gamma camera) tracks the flow of the tracer from your liver into your gallbladder and small intestine.  During your HIDA scan  You'll be asked to change into a hospital gown before your HIDA scan begins. Your health care team will position you on a table, usually on your back. The radioactive tracer is then injected into a vein in your arm.The tracer travels through your bloodstream to your liver, where it's taken up by the bile-producing cells. The radioactive tracer travels with the bile from your liver into your gallbladder and through your bile ducts to your small intestine.You may feel some pressure while the radioactive tracer is injected into your vein. As you lie on the table, a special gamma camera is positioned over your abdomen taking pictures of the tracer as it moves through your body. The gamma camera takes pictures continually for about an hour. You'll need to keep still  during the HIDA scan. This can become uncomfortable, but you may find that you can lessen the discomfort by taking deep breaths and thinking about other things. Tell your health care team if you're uncomfortable. The radiologist will watch on a computer the progress of the radioactive tracer through your body. The HIDA scan may be stopped when the radioactive tracer is seen in the gallbladder and enters your small intestine. This typically takes about an hour. In some cases extra imaging will be performed if original images aren't satisfactory, if morphine is given to help visualize the gallbladder or if the medication CCK is given to look at the contraction of the gallbladder. This test typically takes 2 hours to complete.  You have been scheduled for an endoscopy. Please follow written instructions given to you at your visit today. If you use inhalers (even only as needed), please bring them with you on the day of your procedure.

## 2016-11-21 NOTE — Progress Notes (Signed)
HISTORY OF PRESENT ILLNESS:  Matthew Nixon is a 56 y.o. 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 See that dictation. For his symptomatic GERD his H2 receptor antagonist therapy was discontinued and omeprazole 40 mg daily initiated. CT scan of the abdomen and pelvis was unremarkable except for incidental left-sided diverticulosis. Prior gallbladder ultrasound revealed: Sludge, fatty liver, nonobstructing kidney stones, and ectatic abdominal aorta. He presents today for follow-up. Patient reports that his rectal quadrant pain continues without change. There is certainly a positional component in terms of it feeling worse and better. He feels that some of his acid reflux symptoms have improved but he still describes significant regurgitation and breakthrough pyrosis. No new problems. He is concerned over potentially serious underlying problems related to his work in the [place] on 03-03-00. He inquires about endoscopy suggested by his sister who is a pulmonologist. Patient did have Cologuard testing which has returned negative.  REVIEW OF SYSTEMS:  All non-GI ROS negative except for sinus trouble, anxiety, back pain, headaches, sleeping problems, increased thirst, shortness of breath  Past Medical History:  Diagnosis Date  . Allergy   . Cardiomyopathy ([place])    DCM with EF 32% on echo 2014 and no ischemia on nuclear stress test  . Chronic systolic CHF (congestive heart failure), NYHA class 2 ([place]) 08/07/2016  . COPD (chronic obstructive pulmonary disease) ([place])   . Hypertension   . LBBB (left bundle branch block)   . OSA on CPAP 08/07/2016  . Oxygen deficiency   . Sleep apnea     Past Surgical History:  Procedure Laterality Date  . ANKLE SURGERY Right   . CARDIAC  CATHETERIZATION    . HAND SURGERY Bilateral   . HAND SURGERY     Right  . HAND SURGERY     Left carpel tunnel  . THUMB ARTHROSCOPY      Social History Matthew Nixon  reports that he has never smoked. He has never used smokeless tobacco. He reports that he does not drink alcohol or use drugs.  family history includes Heart disease in his father and mother.  No Known Allergies     PHYSICAL EXAMINATION: Vital signs: BP (!) 150/82 (BP Location: Left Arm, Patient Position: Sitting, Cuff Size: Normal)   Pulse 80   Ht 5' 7.5" (1.715 m) Comment: height measured without shoes  Wt 200 lb (90.7 kg)   BMI 30.86 kg/m   Constitutional: generally well-appearing, no acute distress Psychiatric: alert and oriented x3, cooperative Eyes: extraocular movements intact, anicteric, conjunctiva pink Mouth: oral pharynx moist, no lesions Neck: supple no lymphadenopathy Cardiovascular: heart regular rate and rhythm, no murmur Lungs: clear to auscultation bilaterally Abdomen: soft, nontender, nondistended, no obvious ascites, no peritoneal signs, normal bowel sounds, no organomegaly Rectal:Omitted Extremities: no clubbing cyanosis or lower extremity edema bilaterally Skin: no lesions on visible extremities Neuro: No focal deficits. Cranial nerves intact  ASSESSMENT:  #1. Chronic recurrent right upper quadrant pain. Most consistent with musculoskeletal. Gallbladder sludge on ultrasound. Rule out chronic cholecystitis. #2. GERD. Significant regurgitation component. Symptoms despite initiation of once daily PPI therapy #3. Multiple significant medical problems including cardiomyopathy #4. Health related anxiety   PLAN:  #1. Reflux precautions #2. Recommend a 10 pound weight loss. This will help with the regurgitation component of his GERD #3. Change omeprazole  prescription to 40 mg twice daily #4. Schedule HIDA scan to rule out chronic cholecystitis #5. Schedule diagnostic EGD to rule out  Barrett's esophagus and further assess issues with abdominal complaints and pain. Patient is HIGH RISK given his comorbidities. Thus, the procedure will be performed at the hospital with MAC/propofol.The nature of the procedure, as well as the risks, benefits, and alternatives were carefully and thoroughly reviewed with the patient. Ample time for discussion and questions allowed. The patient understood, was satisfied, and agreed to proceed.

## 2016-11-26 ENCOUNTER — Ambulatory Visit (HOSPITAL_COMMUNITY)
Admission: RE | Admit: 2016-11-26 | Discharge: 2016-11-26 | Disposition: A | Discharge status: Home / Self Care | Payer: Medicare Other | Source: Ambulatory Visit | Attending: Internal Medicine | Admitting: Internal Medicine

## 2016-11-26 DIAGNOSIS — R1011 Right upper quadrant pain: Secondary | ICD-10-CM | POA: Insufficient documentation

## 2016-11-26 DIAGNOSIS — K219 Gastro-esophageal reflux disease without esophagitis: Secondary | ICD-10-CM | POA: Diagnosis not present

## 2016-11-26 MED ORDER — TECHNETIUM TC 99M MEBROFENIN IV KIT
5.2000 | PACK | Freq: Once | INTRAVENOUS | Status: AC | PRN
  Administered 2016-11-26: 5.2 via INTRAVENOUS

## 2016-11-28 ENCOUNTER — Ambulatory Visit (HOSPITAL_BASED_OUTPATIENT_CLINIC_OR_DEPARTMENT_OTHER): Payer: Commercial Managed Care - PPO | Attending: Cardiology | Admitting: Cardiology

## 2016-11-28 ENCOUNTER — Telehealth: Payer: Self-pay | Admitting: *Deleted

## 2016-11-28 ENCOUNTER — Ambulatory Visit (INDEPENDENT_AMBULATORY_CARE_PROVIDER_SITE_OTHER): Payer: Medicare Other | Admitting: Internal Medicine

## 2016-11-28 ENCOUNTER — Telehealth: Payer: Self-pay

## 2016-11-28 ENCOUNTER — Encounter: Payer: Self-pay | Admitting: Internal Medicine

## 2016-11-28 VITALS — Ht 69.0 in | Wt 199.0 lb

## 2016-11-28 VITALS — BP 136/84 | HR 88 | Ht 69.0 in | Wt 198.0 lb

## 2016-11-28 DIAGNOSIS — Z9989 Dependence on other enabling machines and devices: Secondary | ICD-10-CM | POA: Insufficient documentation

## 2016-11-28 DIAGNOSIS — I10 Essential (primary) hypertension: Secondary | ICD-10-CM | POA: Diagnosis not present

## 2016-11-28 DIAGNOSIS — G4733 Obstructive sleep apnea (adult) (pediatric): Secondary | ICD-10-CM | POA: Insufficient documentation

## 2016-11-28 DIAGNOSIS — R058 Other specified cough: Secondary | ICD-10-CM

## 2016-11-28 DIAGNOSIS — R05 Cough: Secondary | ICD-10-CM

## 2016-11-28 DIAGNOSIS — J479 Bronchiectasis, uncomplicated: Secondary | ICD-10-CM

## 2016-11-28 MED ORDER — TRAMADOL HCL 50 MG PO TABS: ORAL_TABLET | ORAL | 0 refills | Status: DC

## 2016-11-28 MED ORDER — IRBESARTAN 300 MG PO TABS: 300.0000 mg | ORAL_TABLET | Freq: Every day | ORAL | 11 refills | Status: DC

## 2016-11-28 NOTE — Telephone Encounter (Signed)
Confirmation from patient that he will complete his sleep study tonight and he wants his medicare insurance to be filed.11/28/16.

## 2016-11-28 NOTE — Assessment & Plan Note (Signed)
S/p 9/11 exposure   Although he has structural evidence of bronchiectasis reported on ct and some am cough/ with ? Assoc sinusitis it's not possible to evaluate this(with pfts)  or what meds are really needed to treat it s first trial off acei and clearly what's been offered to date has been ineffective.   see avs for instructions unique to this ov

## 2016-11-28 NOTE — Assessment & Plan Note (Signed)
The most common causes of chronic cough in immunocompetent adults include the following: upper airway cough syndrome (UACS), previously referred to as postnasal drip syndrome (PNDS), which is caused by variety of rhinosinus conditions; (2) asthma; (3) GERD; (4) chronic bronchitis from cigarette smoking or other inhaled environmental irritants; (5) nonasthmatic eosinophilic bronchitis; and (6) bronchiectasis.   These conditions, singly or in combination, have accounted for up to 94% of the causes of chronic cough in prospective studies.   Other conditions have constituted no >6% of the causes in prospective studies These have included bronchogenic carcinoma, chronic interstitial pneumonia, sarcoidosis, left ventricular failure, ACEI-induced cough, and aspiration from a condition associated with pharyngeal dysfunction.    Chronic cough is often simultaneously caused by more than one condition. A single cause has been found from 38 to 82% of the time, multiple causes from 18 to 62%. Multiply caused cough has been the result of three diseases up to 42% of the time.       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   These are the same pts (now being labeled as having "irritable larynx syndrome" by some cough centers) who not infrequently have a history of having failed to tolerate ace inhibitors,  dry powder inhalers or biphosphonates or report having atypical/extraesophageal reflux symptoms that don't respond to standard doses of PPI  As is the case  here -  and are easily confused as having aecopd or asthma flares by even experienced allergists/ pulmonologists (myself included).   First step is try off acei and max rx for gerd then f/u in 6 weeks  Total time devoted to counseling  > 50 % of initial 60 min office visit:  review case with pt/ discussion of options/alternatives/ personally creating written customized instructions  in presence of pt  then going over those specific  Instructions directly with the pt including how to use all of the meds but in particular covering each new medication in detail and the difference between the maintenance= "automatic" meds and the prns using an action plan format for the latter (If this problem/symptom => do that organization reading Left to right).  Please see AVS from this visit for a full list of these instructions which I personally wrote for this pt and  are unique to this visit.

## 2016-11-28 NOTE — Telephone Encounter (Signed)
-----   Message from Elease Etienne sent at 11/24/2016 11:11 AM EDT ----- Regarding: SLEEP STUDY HAS TO BE MOVED Contact: [phone] [facility] IS REQUESTING RECORDS WHICH I FAXED  TODAY BEFORE THIS PATIENT CAN BE [place] / [place] . SO IT TAKES UP TO 24- 48 POSSIBLY. YOU WILL HAVE TO [facility] HIS SLEEP STUDY  ( ITS FOR TOMORROW)

## 2016-11-28 NOTE — Progress Notes (Signed)
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 2002 Dr Candiss Norse in [facility]  And needing advair  since 2009 but no response referred to pulmonary clinic 11/28/2016 by Dr   Harvie Heck    11/28/2016 1st [facility] office visit/ [place]   Chief Complaint  Patient presents with  . Pulmonary Consult    Referred by Dr. Mackie Pai. Pt c/o cough and wheezing since 2009. 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 He states his breathing gets worse in the winter time.  His cough is prod with yellow sputum and can be triggered by laughing or taking a deep breath. Sputum is occ blood tinged.  He uses an albuterol inhaler 3 x per wk on average.   mucus is worst in am and also at hs like choking  Doe = MMRC3 = can't walk 100 yards even at a slow pace at a flat grade s stopping due to sob    No obvious day to day or daytime variability or assoc  mucus plugs or hemoptysis or cp or chest tightness, subjective wheeze or overt sinus or hb symptoms. No unusual exp hx or h/o childhood pna/ asthma or knowledge of premature birth.  Sleeping ok without nocturnal  or early am exacerbation  of respiratory  c/o's or need for noct saba. Also denies any obvious fluctuation of symptoms with weather or environmental changes or other aggravating or alleviating factors except as outlined above   Current Medications, Allergies, Complete Past Medical History, Past Surgical History, Family History, and Social History were reviewed in [employer] record.  ROS  The following are not active complaints unless bolded sore throat, dysphagia, dental problems, itching, sneezing,  nasal congestion or excess/ purulent secretions, ear ache,   fever, chills, sweats, unintended wt loss, classically  pleuritic or exertional cp,  orthopnea pnd or leg swelling, presyncope, palpitations, abdominal pain, anorexia, nausea, vomiting, diarrhea  or change in bowel or bladder habits, change in stools or urine, dysuria,hematuria,  rash, arthralgias, visual complaints, headache, numbness, weakness or ataxia or problems with walking or coordination,  change in mood/affect or memory.            Review of Systems     Objective:   Physical Exam   amb pleasant hispanic male    Wt Readings from Last 3 Encounters:  11/28/16 198 lb (89.8 kg)  11/21/16 200 lb (90.7 kg)  10/30/16 200 lb 9.6 oz (91 kg)    Vital signs reviewed  - Note on arrival 02 sats  97% on RA   HEENT: nl dentition, turbinates bilaterally, and oropharynx. Nl external ear canals without cough reflex   NECK :  without JVD/Nodes/TM/ nl carotid upstrokes bilaterally   LUNGS: no acc muscle use,  Nl contour chest - could not take a breath in s severe coughing fits    CV:  RRR  no s3 or murmur or increase in P2, and no edema   ABD:  soft and nontender with nl inspiratory excursion in the supine position. No bruits or organomegaly appreciated, bowel sounds nl  MS:  Nl gait/ ext warm without deformities, calf tenderness, cyanosis or clubbing No obvious joint restrictions   SKIN: warm and dry without  lesions    NEURO:  alert, approp, nl sensorium with  no motor or cerebellar deficits apparent.        I personally reviewed images and agree with radiology impression as follows:  CXR:   11/04/16 The lungs are adequately inflated and clear. The heart is top-normal in size but stable. The pulmonary vascularity is normal. The mediastinum is normal in width. There is calcification in the wall of the aortic arch. There is no pleural effusion. The bony thorax exhibits no acute abnormality.       Assessment:

## 2016-11-28 NOTE — Assessment & Plan Note (Signed)
Trial off acei 11/28/2016 due to cough x decades   In the best review of chronic cough to date ( NEJM 2016 375 [id number]) ,  ACEi are now felt to cause cough in up to  20% of pts which is a 4 fold increase from previous reports and does not include the variety of non-specific complaints we see in pulmonary clinic in pts on ACEi but previously attributed to another dx like  Copd/asthma and  include PNDS, throat and chest congestion, "bronchitis", unexplained dyspnea and noct "strangling" sensations, and hoarseness, but also  atypical /refractory GERD symptoms like dysphagia and "bad heartburn"   The only way I know  to prove this is not an "ACEi Case" is a trial off ACEi x a minimum of 6 weeks then regroup.   Try avapro 300 mg daily/ f/u due by [facility] cards in 10 days/ f/u here in 6 weeks

## 2016-11-28 NOTE — Patient Instructions (Addendum)
Stop quinapril today and take avapro 300 mg daily instead   Take delsym two tsp every 12 hours and supplement if needed with  tramadol 50 mg up to 2 every 4 hours to suppress the urge to cough. Swallowing water or using ice chips/non mint and menthol containing candies (such as lifesavers or sugarless jolly ranchers) are also effective.  You should rest your voice and avoid activities that you know make you cough.  Once you have eliminated the cough for 3 straight days try reducing the tramadol first,  then the delsym as tolerated.   prilosec should be Take 30- 60 min before your first and last meals of the day    GERD (REFLUX)  is an extremely common cause of respiratory symptoms just like yours , many times with no obvious heartburn at all.    It can be treated with medication, but also with lifestyle changes including elevation of the head of your bed (ideally with 6 inch  bed blocks),  Smoking cessation, avoidance of late meals, excessive alcohol, and avoid fatty foods, chocolate, peppermint, colas, red wine, and acidic juices such as orange juice.  NO MINT OR MENTHOL PRODUCTS SO NO COUGH DROPS   USE SUGARLESS CANDY INSTEAD (Jolley ranchers or Stover's or Life Savers) or even ice chips will also do - the key is to swallow to prevent all throat clearing. NO OIL BASED VITAMINS - use powdered substitutes.     Please schedule a follow up office visit in 6 weeks, call sooner if needed with all medications /inhalers/ solutions in hand so we can verify exactly what you are taking. This includes all medications from all doctors and over the counters

## 2016-11-28 NOTE — Telephone Encounter (Addendum)
RETURN PHONE CALL FROM [employer] .INSURANCE COMPANY CALL TO INFORM us THAT PATIENT WILL BE COMING IN FOR HIS SLEEP STUDY BUT HE ONLY WAMTS HIS TWO PRIMARY Management consultant. HE DOES NOT WANT [facility] FILED . I TRIED TO VERIFY THIS WITH PATIENT BUT  I ONLY GOT HIS VOICE MAIL IN WHICH I LEFT A MESSAGE TO CALL ME BACK. [facility] CONTACT NUMBER [phone]  10:32AM PATIENT CALLED BACK TO VERIFY THAT HE DOES WANT MEDICARE TO BE BILLED AS PRIMARY HIS [employer] INSURANCE IS A 911 INSURANCE AND ITS HAS NOT BEEN CERTIFIED AS OF YET SO THEREFORE  IT CAN NOT BE USED JUST YET

## 2016-12-01 ENCOUNTER — Telehealth: Payer: Self-pay

## 2016-12-01 NOTE — Telephone Encounter (Signed)
Erroneous encounter

## 2016-12-01 NOTE — Procedures (Signed)
   Patient Name: Matthew Nixon, Matthew Nixon Date: 11/28/2016 Gender: Male D.O.B: 1960/12/08 Age (years): 18 Referring Provider: Fransico Him MD, ABSM Height (inches): 69 Interpreting Physician: Fransico Him MD, ABSM Weight (lbs): 199 RPSGT: Madelon Lips BMI: 29 MRN: [id number] Neck Size: 17.00  CLINICAL INFORMATION The patient is referred for a CPAP titration to treat sleep apnea. Date of NPSG, Split Night or HST:09/23/2016  SLEEP STUDY TECHNIQUE As per the AASM Manual for the Scoring of Sleep and Associated Events v2.3 (April 2016) with a hypopnea requiring 4% desaturations.  The channels recorded and monitored were frontal, central and occipital EEG, electrooculogram (EOG), submentalis EMG (chin), nasal and oral airflow, thoracic and abdominal wall motion, anterior tibialis EMG, snore microphone, electrocardiogram, and pulse oximetry. Continuous positive airway pressure (CPAP) was initiated at the beginning of the study and titrated to treat sleep-disordered breathing.  MEDICATIONS Medications self-administered by patient taken the night of the study : N/A  TECHNICIAN COMMENTS Comments added by technician: NO BATHROOM BREAKS TAKEN. INCREASED LEAK NOTED DURING LATTER PORTION OF STUDY  Comments added by scorer: N/A  RESPIRATORY PARAMETERS Optimal PAP Pressure (cm): 9  AHI at Optimal Pressure (/hr):5.1 Overall Minimal O2 (%):91.00  Supine % at Optimal Pressure (%):25 Minimal O2 at Optimal Pressure (%): 91.0    SLEEP ARCHITECTURE The study was initiated at 10:55:14 PM and ended at 5:14:33 AM.  Sleep onset time was 27.0 minutes and the sleep efficiency was 78.2%. The total sleep time was 296.5 minutes.  The patient spent 5.56% of the night in stage N1 sleep, 73.36% in stage N2 sleep, 0.00% in stage N3 and 21.08% in REM.Stage REM latency was 50.0 minutes  Wake after sleep onset was 55.8. Alpha intrusion was absent. Supine sleep was 44.34%.  CARDIAC DATA The 2 lead EKG  demonstrated sinus rhythm. The mean heart rate was 58.53 beats per minute. Other EKG findings include: None.  LEG MOVEMENT DATA The total Periodic Limb Movements of Sleep (PLMS) were 0. The PLMS index was 0.00. A PLMS index of <15 is considered normal in adults.  IMPRESSIONS - The optimal PAP pressure was 9 cm of water. - Central sleep apnea was not noted during this titration (CAI = 0.4/h). - Significant oxygen desaturations were not observed during this titration (min O2 = 91.00%). - The patient snored with Moderate snoring volume during this titration study. - No cardiac abnormalities were observed during this study. - Clinically significant periodic limb movements were not noted during this study. Arousals associated with PLMs were rare.  DIAGNOSIS - Obstructive Sleep Apnea (327.23 [G47.33 ICD-10])  RECOMMENDATIONS - Trial of CPAP therapy on 9 cm H2O with a Small size Philips Respironics Nasal Pillow Mask Dreamwear Under Nose Frame (M) mask and heated humidification. - Avoid alcohol, sedatives and other CNS depressants that may worsen sleep apnea and disrupt normal sleep architecture. - Sleep hygiene should be reviewed to assess factors that may improve sleep quality. - Weight management and regular exercise should be initiated or continued. - Return to [facility] Center for re-evaluation after 10 weeks of therapy  [place], [place] of Sleep Medicine  ELECTRONICALLY SIGNED ON:  12/01/2016, 5:52 PM [place] PH: [phone]   FX: [phone] [place]

## 2016-12-02 NOTE — Telephone Encounter (Signed)
-----   Message from Sueanne Margarita, MD sent at 12/01/2016  5:54 PM EDT ----- Pt had successful PAP titration. Please setup appointment in 10 weeks. Please let [facility] know that order for PAP is in EPIC.

## 2016-12-02 NOTE — Telephone Encounter (Signed)
Informed patient of titration results and he verbalized understanding. Patient understands he will be contacted by [facility] to set up his cpap. He understands to call if [facility] does not contact him with new setup in a timely manner. He understands he will be called once confirmation has been received from [facility] that he has received his new machine to schedule 10 week follow up appointment.  Narcissa notified of new cpap order in epic He was grateful for the call and thanked me

## 2016-12-08 DIAGNOSIS — I340 Nonrheumatic mitral (valve) insufficiency: Secondary | ICD-10-CM | POA: Diagnosis not present

## 2016-12-08 DIAGNOSIS — I10 Essential (primary) hypertension: Secondary | ICD-10-CM | POA: Diagnosis not present

## 2016-12-08 DIAGNOSIS — I5022 Chronic systolic (congestive) heart failure: Secondary | ICD-10-CM | POA: Diagnosis not present

## 2016-12-11 ENCOUNTER — Other Ambulatory Visit: Payer: Self-pay | Admitting: Cardiology

## 2016-12-11 NOTE — Telephone Encounter (Signed)
Follow up     Pt is calling returning call to [facility].

## 2016-12-12 NOTE — Telephone Encounter (Signed)
Per Asencion Partridge) [facility] patient is not eligible for a new cpap through [facility] because medicare has already paid his previous supplier [facility] for 6 months. [facility] requires all 13 month payments. Per [facility] patient will have to return to his previous supplier [facility] or find another one who will take him for the remaining payments. Per [facility] patient can not recieve supplies either at this time.  Reached out to [place] at [place] spoke to Lydia who found the patient's old account [phone] [id number] [phone] and ask me to fax his new prescription to [phone] and they will contact the patient to ship his cpap machine to him.  Patient understands he will be contacted by [facility] to set up his cpap. He understands to call if [facility] does not contact him with new setup in a timely manner. He understands he will be called once confirmation has been received from [place] that he has received his new machine to schedule 10 week follow up appointment.  [facility] notified of new cpap order faxed Please add to airview She was grateful for the call and thanked me

## 2016-12-15 ENCOUNTER — Telehealth: Payer: Self-pay | Admitting: Internal Medicine

## 2016-12-15 NOTE — Telephone Encounter (Signed)
Called Matthew Nixon at [facility], unable to reach her. Called patient who stated that he does not want to see MW. Wants to cancel his appt coming up in July. When asked who did he want to switch to, he stated that the [facility] had to set up the appt.   MW, are you with this patient seeing another provider? Please advise.

## 2016-12-15 NOTE — Telephone Encounter (Signed)
Fine with me but be sure he's completed the recs re off acei prior to the next ov with whoever he sees here and brings his active meds with him to the ov to sort out what part of his problem is acei related and what is not - if not willing to do this then better to refer to med center for second opinion

## 2016-12-16 NOTE — Telephone Encounter (Signed)
Left message for Cassandra to call back.

## 2016-12-17 NOTE — Telephone Encounter (Signed)
Attempted to call cassandra back but was on hold for about 5 mins.  Will try back later.

## 2016-12-18 NOTE — Telephone Encounter (Signed)
Attempted to call Cassandra. Call went straight to voicemail. lmtcb x2 for Cassandra.

## 2016-12-19 NOTE — Telephone Encounter (Signed)
lmtcb x3 for Cassandra.

## 2016-12-22 NOTE — Telephone Encounter (Signed)
Matthew Nixon has been attempted to call x 4 with no success. Will sign off of this message.

## 2017-01-07 NOTE — Telephone Encounter (Signed)
Patient called today to say he has not received his cpap machine from [place] in [place]. His order was sent to [facility] December 12 2016. The cpap assistant Gae Bon) spoke to [facility] on 12/12/16 who asked me to send the cpap order and [facility] would contact the patient to ship his cpap machine. The order was faxed to [phone]) [phone] for acct# [id number] patient [facility]. Reached out to [place] in [place] today and spoke to [place] to ask why the patient never received his cpap unit.Tamika states that based off the patients area code [facility] can not provide him with a cpap unit because they do not have a bid in the [facility] area. The cpap assistant Gae Bon) explained to [facility] that she never received any correspondence on that and Tamika said it was noted with them on 12/15/16. The cpap assistant Gae Bon) will contact the patient with this information. The cpap assistant Gae Bon) called [facility] to see if they would take the patient. The cpap assistant spoke to [place] explained the situation and she asked me to send all patient information to her and she would call medicare to see if they will pay [facility] the remaining payments.  The cpap assistant will fax the sleep study, demographics, Insurance info, and office visit to [facility] [employer]. Patient notified and agrees with the change

## 2017-01-08 ENCOUNTER — Encounter (HOSPITAL_COMMUNITY): Payer: Self-pay | Admitting: *Deleted

## 2017-01-09 ENCOUNTER — Ambulatory Visit: Payer: Medicare Other | Admitting: Internal Medicine

## 2017-01-13 ENCOUNTER — Ambulatory Visit (HOSPITAL_COMMUNITY): Payer: Medicare Other | Admitting: Anesthesiology

## 2017-01-13 ENCOUNTER — Encounter (HOSPITAL_COMMUNITY)
Admission: RE | Disposition: A | Discharge status: Home / Self Care | Payer: Self-pay | Source: Ambulatory Visit | Attending: Internal Medicine

## 2017-01-13 ENCOUNTER — Ambulatory Visit (HOSPITAL_COMMUNITY)
Admission: RE | Admit: 2017-01-13 | Discharge: 2017-01-13 | Disposition: A | Discharge status: Home / Self Care | Payer: Medicare Other | Source: Ambulatory Visit | Attending: Internal Medicine | Admitting: Internal Medicine

## 2017-01-13 ENCOUNTER — Encounter (HOSPITAL_COMMUNITY): Payer: Self-pay

## 2017-01-13 DIAGNOSIS — I429 Cardiomyopathy, unspecified: Secondary | ICD-10-CM | POA: Insufficient documentation

## 2017-01-13 DIAGNOSIS — Z87442 Personal history of urinary calculi: Secondary | ICD-10-CM | POA: Insufficient documentation

## 2017-01-13 DIAGNOSIS — I5022 Chronic systolic (congestive) heart failure: Secondary | ICD-10-CM | POA: Insufficient documentation

## 2017-01-13 DIAGNOSIS — R51 Headache: Secondary | ICD-10-CM | POA: Insufficient documentation

## 2017-01-13 DIAGNOSIS — Z8249 Family history of ischemic heart disease and other diseases of the circulatory system: Secondary | ICD-10-CM | POA: Diagnosis not present

## 2017-01-13 DIAGNOSIS — K219 Gastro-esophageal reflux disease without esophagitis: Secondary | ICD-10-CM

## 2017-01-13 DIAGNOSIS — J449 Chronic obstructive pulmonary disease, unspecified: Secondary | ICD-10-CM | POA: Insufficient documentation

## 2017-01-13 DIAGNOSIS — R1011 Right upper quadrant pain: Secondary | ICD-10-CM | POA: Diagnosis not present

## 2017-01-13 DIAGNOSIS — Z8489 Family history of other specified conditions: Secondary | ICD-10-CM | POA: Insufficient documentation

## 2017-01-13 DIAGNOSIS — I110 Hypertensive heart disease with heart failure: Secondary | ICD-10-CM | POA: Insufficient documentation

## 2017-01-13 DIAGNOSIS — F419 Anxiety disorder, unspecified: Secondary | ICD-10-CM | POA: Diagnosis not present

## 2017-01-13 DIAGNOSIS — G4733 Obstructive sleep apnea (adult) (pediatric): Secondary | ICD-10-CM | POA: Diagnosis not present

## 2017-01-13 DIAGNOSIS — I10 Essential (primary) hypertension: Secondary | ICD-10-CM | POA: Diagnosis not present

## 2017-01-13 DIAGNOSIS — I447 Left bundle-branch block, unspecified: Secondary | ICD-10-CM | POA: Diagnosis not present

## 2017-01-13 HISTORY — DX: Pain, unspecified: R52

## 2017-01-13 HISTORY — DX: Headache, unspecified: R51.9

## 2017-01-13 HISTORY — DX: Other chronic pain: G89.29

## 2017-01-13 HISTORY — DX: Gastro-esophageal reflux disease without esophagitis: K21.9

## 2017-01-13 HISTORY — DX: Headache: R51

## 2017-01-13 HISTORY — PX: ESOPHAGOGASTRODUODENOSCOPY (EGD) WITH PROPOFOL: SHX5813

## 2017-01-13 HISTORY — DX: Dorsalgia, unspecified: M54.9

## 2017-01-13 HISTORY — DX: Dyspnea, unspecified: R06.00

## 2017-01-13 HISTORY — DX: Post-traumatic stress disorder, unspecified: F43.10

## 2017-01-13 HISTORY — DX: Abnormal electrocardiogram (ECG) (EKG): R94.31

## 2017-01-13 SURGERY — ESOPHAGOGASTRODUODENOSCOPY (EGD) WITH PROPOFOL
Anesthesia: Monitor Anesthesia Care

## 2017-01-13 MED ORDER — SODIUM CHLORIDE 0.9 % IV SOLN: INTRAVENOUS | Status: DC

## 2017-01-13 MED ORDER — PROPOFOL 10 MG/ML IV BOLUS
INTRAVENOUS | Status: AC
  Filled 2017-01-13: qty 20

## 2017-01-13 MED ORDER — PROPOFOL 10 MG/ML IV BOLUS
INTRAVENOUS | Status: DC | PRN
  Administered 2017-01-13 (×3): 20 mg via INTRAVENOUS
  Administered 2017-01-13: 40 mg via INTRAVENOUS
  Administered 2017-01-13 (×2): 20 mg via INTRAVENOUS

## 2017-01-13 MED ORDER — LACTATED RINGERS IV SOLN
INTRAVENOUS | Status: DC
  Administered 2017-01-13: 11:00:00 via INTRAVENOUS

## 2017-01-13 SURGICAL SUPPLY — 14 items

## 2017-01-13 NOTE — Transfer of Care (Signed)
Immediate Anesthesia Transfer of Care Note  Patient: Matthew Nixon  Procedure(s) Performed: Procedure(s): ESOPHAGOGASTRODUODENOSCOPY (EGD) WITH PROPOFOL (N/A)  Patient Location: PACU  Anesthesia Type:MAC  Level of Consciousness: sedated  Airway & Oxygen Therapy: Patient Spontanous Breathing and Patient connected to nasal cannula oxygen  Post-op Assessment: Report given to RN and Post -op Vital signs reviewed and stable  Post vital signs: Reviewed and stable  Last Vitals:  Vitals:   01/13/17 1050  BP: (!) 173/101  Pulse: 75  Resp: 20  Temp: 36.6 C    Last Pain:  Vitals:   01/13/17 1050  TempSrc: Oral         Complications: No apparent anesthesia complications

## 2017-01-13 NOTE — Op Note (Signed)
[facility] Patient Name: Matthew Nixon Procedure Date: 01/13/2017 MRN: [id number] Attending MD: Docia Chuck. Henrene Pastor , MD Date of Birth: 1961-06-03 CSN: [id number] Age: 56 Admit Type: Outpatient Procedure:                Upper GI endoscopy Indications:              Abdominal pain in the right upper quadrant,                            Suspected esophageal reflux Providers:                Docia Chuck. Henrene Pastor, MD, Elmer Ramp. Tilden Dome, RN, Tinnie Gens, Technician Referring MD:              Medicines:                Monitored Anesthesia Care Complications:            No immediate complications. Estimated Blood Loss:     Estimated blood loss: none. Procedure:                Pre-Anesthesia Assessment:                           - Prior to the procedure, a History and Physical                            was performed, and patient medications and                            allergies were reviewed. The patient's tolerance of                            previous anesthesia was also reviewed. The risks                            and benefits of the procedure and the sedation                            options and risks were discussed with the patient.                            All questions were answered, and informed consent                            was obtained. Prior Anticoagulants: The patient has                            taken no previous anticoagulant or antiplatelet                            agents. ASA Grade Assessment: III - A patient with  severe systemic disease. After reviewing the risks                            and benefits, the patient was deemed in                            satisfactory condition to undergo the procedure.                           After obtaining informed consent, the endoscope was                            passed under direct vision. Throughout the                            procedure, the patient's  blood pressure, pulse, and                            oxygen saturations were monitored continuously. The                            [id number]) scope was introduced through the                            mouth, and advanced to the second part of duodenum.                            The upper GI endoscopy was accomplished without                            difficulty. The patient tolerated the procedure                            well. Findings:      The esophagus was normal.      The stomach was normal.      The examined duodenum was normal.      The cardia and gastric fundus were normal on retroflexion. Impression:               - Normal esophagus.                           - Normal stomach.                           - Normal examined duodenum.                           - No specimens collected. Moderate Sedation:      none Recommendation:           - Patient has a contact number available for                            emergencies. The signs and symptoms of potential  delayed complications were discussed with the                            patient. Return to normal activities tomorrow.                            Written discharge instructions were provided to the                            patient.                           - Resume previous diet.                           - Continue present medications.                           - Reflux precautions Procedure Code(s):        --- Professional ---                           [phone], Esophagogastroduodenoscopy, flexible,                            transoral; diagnostic, including collection of                            specimen(s) by brushing or washing, when performed                            (separate procedure) Diagnosis Code(s):        --- Professional ---                           R10.11, Right upper quadrant pain CPT copyright 2016 American Medical Association. All rights reserved. The codes  documented in this report are preliminary and upon coder review may  be revised to meet current compliance requirements. Docia Chuck. Henrene Pastor, MD 01/13/2017 11:35:58 AM This report has been signed electronically. Number of Addenda: 0

## 2017-01-13 NOTE — Discharge Instructions (Signed)
YOU HAD AN ENDOSCOPIC PROCEDURE TODAY: Refer to the procedure report and other information in the discharge instructions given to you for any specific questions about what was found during the examination. If this information does not answer your questions, please call [place] office at [phone] to clarify.   YOU SHOULD EXPECT: Some feelings of bloating in the abdomen. Passage of more gas than usual. Walking can help get rid of the air that was put into your GI tract during the procedure and reduce the bloating. If you had a lower endoscopy (such as a colonoscopy or flexible sigmoidoscopy) you may notice spotting of blood in your stool or on the toilet paper. Some abdominal soreness may be present for a day or two, also.  DIET: Your first meal following the procedure should be a light meal and then it is ok to progress to your normal diet. A half-sandwich or bowl of soup is an example of a good first meal. Heavy or fried foods are harder to digest and may make you feel nauseous or bloated. Drink plenty of fluids but you should avoid alcoholic beverages for 24 hours. If you had a esophageal dilation, please see attached instructions for diet.    ACTIVITY: Your care partner should take you home directly after the procedure. You should plan to take it easy, moving slowly for the rest of the day. You can resume normal activity the day after the procedure however YOU SHOULD NOT DRIVE, use power tools, machinery or perform tasks that involve climbing or major physical exertion for 24 hours (because of the sedation medicines used during the test).   SYMPTOMS TO REPORT IMMEDIATELY: A gastroenterologist can be reached at any hour. Please call [phone]  for any of the following symptoms:   Following upper endoscopy (EGD, EUS, ERCP, esophageal dilation) Vomiting of blood or coffee ground material  New, significant abdominal pain  New, significant chest pain or pain under the shoulder blades  Painful or  persistently difficult swallowing  New shortness of breath  Black, tarry-looking or red, bloody stools  FOLLOW UP:  If any biopsies were taken you will be contacted by phone or by letter within the next 1-3 weeks. Call [phone]  if you have not heard about the biopsies in 3 weeks.  Please also call with any specific questions about appointments or follow up tests.  

## 2017-01-13 NOTE — H&P (Signed)
HISTORY OF PRESENT ILLNESS:  Kaitlin Ardito is a 56 y.o. 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 See that dictation. For his symptomatic GERD his H2 receptor antagonist therapy was discontinued and omeprazole 40 mg daily initiated. CT scan of the abdomen and pelvis was unremarkable except for incidental left-sided diverticulosis. Prior gallbladder ultrasound revealed: Sludge, fatty liver, nonobstructing kidney stones, and ectatic abdominal aorta. He presents today for follow-up. Patient reports that his rectal quadrant pain continues without change. There is certainly a positional component in terms of it feeling worse and better. He feels that some of his acid reflux symptoms have improved but he still describes significant regurgitation and breakthrough pyrosis. No new problems. He is concerned over potentially serious underlying problems related to his work in the [place] on 03-03-00. He inquires about endoscopy suggested by his sister who is a pulmonologist. Patient did have Cologuard testing which has returned negative.  REVIEW OF SYSTEMS:  All non-GI ROS negative except for sinus trouble, anxiety, back pain, headaches, sleeping problems, increased thirst, shortness of breath      Past Medical History:  Diagnosis Date  . Allergy   . Cardiomyopathy ([place])    DCM with EF 32% on echo 2014 and no ischemia on nuclear stress test  . Chronic systolic CHF (congestive heart failure), NYHA class 2 ([place]) 08/07/2016  . COPD (chronic obstructive pulmonary disease) ([place])   . Hypertension   . LBBB (left bundle branch block)   . OSA on CPAP 08/07/2016  . Oxygen deficiency   . Sleep apnea          Past Surgical History:  Procedure Laterality Date  . ANKLE SURGERY  Right   . CARDIAC CATHETERIZATION    . HAND SURGERY Bilateral   . HAND SURGERY     Right  . HAND SURGERY     Left carpel tunnel  . THUMB ARTHROSCOPY      Social History Earlin Sweeden  reports that he has never smoked. He has never used smokeless tobacco. He reports that he does not drink alcohol or use drugs.  family history includes Heart disease in his father and mother.  No Known Allergies     PHYSICAL EXAMINATION: Vital signs: BP (!) 150/82 (BP Location: Left Arm, Patient Position: Sitting, Cuff Size: Normal)   Pulse 80   Ht 5' 7.5" (1.715 m) Comment: height measured without shoes  Wt 200 lb (90.7 kg)   BMI 30.86 kg/m   Constitutional: generally well-appearing, no acute distress Psychiatric: alert and oriented x3, cooperative Eyes: extraocular movements intact, anicteric, conjunctiva pink Mouth: oral pharynx moist, no lesions Neck: supple no lymphadenopathy Cardiovascular: heart regular rate and rhythm, no murmur Lungs: clear to auscultation bilaterally Abdomen: soft, nontender, nondistended, no obvious ascites, no peritoneal signs, normal bowel sounds, no organomegaly Rectal:Omitted Extremities: no clubbing cyanosis or lower extremity edema bilaterally Skin: no lesions on visible extremities Neuro: No focal deficits. Cranial nerves intact  ASSESSMENT:  #1. Chronic recurrent right upper quadrant pain. Most consistent with musculoskeletal. Gallbladder sludge on ultrasound. Rule out chronic cholecystitis. #2. GERD. Significant regurgitation component. Symptoms despite initiation of once daily PPI therapy #3. Multiple significant medical problems including cardiomyopathy #4. Health related anxiety   PLAN:  #1. Reflux precautions #2. Recommend a 10 pound weight loss. This will help with  the regurgitation component of his GERD #3. Change omeprazole prescription to 40 mg twice daily #4. Schedule HIDA scan to rule out chronic  cholecystitis #5. Schedule diagnostic EGD to rule out Barrett's esophagus and further assess issues with abdominal complaints and pain. Patient is HIGH RISK given his comorbidities. Thus, the procedure will be performed at the hospital with MAC/propofol.The nature of the procedure, as well as the risks, benefits, and alternatives were carefully and thoroughly reviewed with the patient. Ample time for discussion and questions allowed. The patient understood, was satisfied, and agreed to proceed.   GI ATTENDING  Patient presents today for his upper endoscopy with MAC to evaluate chronic right quadrant pain. He has had no clinical change since his recent office visit. He continues with an element of health related anxiety. He is accompanied by his wife.  Docia Chuck. Geri Seminole., M.D. [facility] Division of Gastroenterology

## 2017-01-13 NOTE — Anesthesia Postprocedure Evaluation (Signed)
Anesthesia Post Note  Patient: Matthew Nixon  Procedure(s) Performed: Procedure(s) (LRB): ESOPHAGOGASTRODUODENOSCOPY (EGD) WITH PROPOFOL (N/A)     Patient location during evaluation: PACU Anesthesia Type: MAC Level of consciousness: awake and alert Pain management: pain level controlled Vital Signs Assessment: post-procedure vital signs reviewed and stable Respiratory status: spontaneous breathing, nonlabored ventilation, respiratory function stable and patient connected to nasal cannula oxygen Cardiovascular status: stable and blood pressure returned to baseline Anesthetic complications: no    Last Vitals:  Vitals:   01/13/17 1140 01/13/17 1150  BP: 139/80 (!) 152/86  Pulse: 76 65  Resp: (!) 26 (!) 22  Temp:      Last Pain:  Vitals:   01/13/17 1136  TempSrc: Oral                 Effie Berkshire

## 2017-01-13 NOTE — Anesthesia Preprocedure Evaluation (Signed)
Anesthesia Evaluation  Patient identified by MRN, date of birth, ID band Patient awake    Reviewed: Allergy & Precautions, NPO status , Patient's Chart, lab work & pertinent test results  Airway Mallampati: I  TM Distance: >3 FB Neck ROM: Full    Dental  (+) Teeth Intact, Dental Advisory Given   Pulmonary neg pulmonary ROS, sleep apnea , COPD,    breath sounds clear to auscultation       Cardiovascular hypertension, Pt. on medications +CHF  + dysrhythmias  Rhythm:Regular Rate:Normal     Neuro/Psych  Headaches, PSYCHIATRIC DISORDERS negative neurological ROS     GI/Hepatic Neg liver ROS, GERD  Medicated,  Endo/Other  negative endocrine ROS  Renal/GU negative Renal ROS     Musculoskeletal negative musculoskeletal ROS (+)   Abdominal   Peds  Hematology negative hematology ROS (+)   Anesthesia Other Findings Day of surgery medications reviewed with the patient.  Reproductive/Obstetrics                             Anesthesia Physical Anesthesia Plan  ASA: II  Anesthesia Plan: MAC   Post-op Pain Management:    Induction: Intravenous  PONV Risk Score and Plan: Propofol  Airway Management Planned: Natural Airway  Additional Equipment:   Intra-op Plan:   Post-operative Plan:   Informed Consent: I have reviewed the patients History and Physical, chart, labs and discussed the procedure including the risks, benefits and alternatives for the proposed anesthesia with the patient or authorized representative who has indicated his/her understanding and acceptance.     Plan Discussed with: CRNA  Anesthesia Plan Comments:         Anesthesia Quick Evaluation

## 2017-01-14 ENCOUNTER — Telehealth: Payer: Self-pay | Admitting: Internal Medicine

## 2017-01-14 DIAGNOSIS — R05 Cough: Secondary | ICD-10-CM

## 2017-01-14 DIAGNOSIS — R059 Cough, unspecified: Secondary | ICD-10-CM

## 2017-01-14 NOTE — Telephone Encounter (Signed)
Per last PN 12/15/16- Melvyn Novas, Christena Deem, MD      4:53 PM  Note    Fine with me but be sure he's completed the recs re off acei prior to the next ov with whoever he sees here and brings his active meds with him to the ov to sort out what part of his problem is acei related and what is not - if not willing to do this then better to refer to med center for second opinion     LMTCB for the pt

## 2017-01-15 ENCOUNTER — Encounter (HOSPITAL_COMMUNITY): Payer: Self-pay | Admitting: Internal Medicine

## 2017-01-15 NOTE — Telephone Encounter (Signed)
MR are you willing to take on pt? You have first available consult for 03/11/17

## 2017-01-15 NOTE — Telephone Encounter (Signed)
lmtcb for pt.  

## 2017-01-16 NOTE — Telephone Encounter (Signed)
Sure. Hopefully you cna get him in a 30 min slot  Dr. Brand Males, M.D., [facility].C.P Pulmonary and Critical Care Medicine Staff Physician [place] [facility] Pager: [phone], If no answer or between  15:00h - 7:00h: call [phone]  01/16/2017 8:22 AM

## 2017-01-16 NOTE — Telephone Encounter (Signed)
Ok thanks.   Dr. Brand Males, M.D., [facility].C.P Pulmonary and Critical Care Medicine Staff Physician [place] [facility] Pager: [phone], If no answer or between  15:00h - 7:00h: call [phone]  01/16/2017 3:08 PM

## 2017-01-16 NOTE — Telephone Encounter (Signed)
Left message for him to call back.  

## 2017-01-16 NOTE — Telephone Encounter (Signed)
[facility] is [facility]. Pt request that we contact [facility] program to see exactly what is needed, as he feels only a PFT is needed. Pt states he is already on medications to help with his breathing.  I have spoken with Remo Lipps with [facility] [employer] [facility], who confirmed both PFT and OV is needed.  Pt has been scheduled for consult with MR for 03/11/17 and PFT for 02/26/17  Will route to MR to make aware.

## 2017-01-16 NOTE — Telephone Encounter (Signed)
Patient and [facility] representative called back - pt ok with seeing MR - pt and rep states pt is having cough with pain- needs to be seen sooner than Sept - pt is also needing PFT -pt can be reached at [phone] -pr

## 2017-02-03 ENCOUNTER — Telehealth: Payer: Self-pay | Admitting: Internal Medicine

## 2017-02-03 NOTE — Telephone Encounter (Signed)
Left message for pt that I would leave a print out of his last dictation that lists his diagnosis at the desk for him to pick up.

## 2017-02-03 NOTE — Telephone Encounter (Signed)
Patient was set up on 01/30/17. Patient has a compliance range of 9/11=31 days -11-9 =90. Patient understands his 10 week appointment is for his insurance compliance.

## 2017-02-09 ENCOUNTER — Encounter: Payer: Self-pay | Admitting: Medical

## 2017-02-09 ENCOUNTER — Ambulatory Visit (INDEPENDENT_AMBULATORY_CARE_PROVIDER_SITE_OTHER): Payer: Self-pay

## 2017-02-09 ENCOUNTER — Other Ambulatory Visit: Payer: Self-pay | Admitting: Emergency Medicine

## 2017-02-09 ENCOUNTER — Ambulatory Visit (INDEPENDENT_AMBULATORY_CARE_PROVIDER_SITE_OTHER): Payer: Medicare Other | Admitting: Medical

## 2017-02-09 VITALS — BP 167/79 | HR 71 | Temp 98.0°F | Resp 16 | Ht 69.0 in | Wt 198.4 lb

## 2017-02-09 DIAGNOSIS — T7840XA Allergy, unspecified, initial encounter: Secondary | ICD-10-CM

## 2017-02-09 DIAGNOSIS — Z0000 Encounter for general adult medical examination without abnormal findings: Secondary | ICD-10-CM

## 2017-02-09 DIAGNOSIS — R062 Wheezing: Secondary | ICD-10-CM

## 2017-02-09 DIAGNOSIS — B351 Tinea unguium: Secondary | ICD-10-CM

## 2017-02-09 DIAGNOSIS — L309 Dermatitis, unspecified: Secondary | ICD-10-CM

## 2017-02-09 DIAGNOSIS — R05 Cough: Secondary | ICD-10-CM

## 2017-02-09 MED ORDER — HYDROXYZINE HCL 10 MG PO TABS: ORAL_TABLET | ORAL | 0 refills | Status: AC

## 2017-02-09 MED ORDER — TRIAMCINOLONE ACETONIDE 0.5 % EX CREA: 1.0000 "application " | TOPICAL_CREAM | Freq: Three times a day (TID) | CUTANEOUS | 0 refills | Status: DC

## 2017-02-09 MED ORDER — CICLOPIROX 8 % EX SOLN
Freq: Every day | CUTANEOUS | 0 refills | Status: DC
Start: 2017-02-09 — End: 2017-07-16

## 2017-02-09 NOTE — Progress Notes (Signed)
Subjective:    Patient ID: Matthew Nixon, male    DOB: Jan 20, 1961, 56 y.o.   MRN: [id number]  HPI   Pt in for acute visit.  Pt states he is being evaluated by various specialist. Pt states his cardiologist and pulomonlogist differing opinion on recent medication treatment. He is on what cardiologist recommended.  Pt is getting a second opinion from another pulmonologist. He states MD in past told him he had copd. But recently Dr. Melvyn Novas told him he thinks he does not have copd. So he is know seeing another specialist for second opinion.  Pt was on inhalers before he moved here. He states on inhalers since 9-11.  Pt is still following up with cardiologist in [place]. He has seen cardiologist locally.  Pt saw Dr. Radford Pax.   Pt in today has slight rash to both medial lower calfs. Very uncofortable itch. It is on both medial lower legs. No other areas of itch. He is not reporting any insect bites. He did cut gross on Wednesday. But he states was on push mower. Pt states itching for about a week. The rash feel like it is deep.  On review no suspicious exposure.  He has been itching the area. Small scabs. But no ulcers.   Review of Systems  Constitutional: Negative for chills, fatigue and fever.  Respiratory: Negative for cough, chest tightness, shortness of breath and wheezing.   Cardiovascular: Negative for chest pain and palpitations.  Gastrointestinal: Negative for abdominal pain, blood in stool, diarrhea, nausea and vomiting.  Musculoskeletal: Negative for arthralgias, gait problem, myalgias, neck pain and neck stiffness.  Skin: Positive for rash.  Neurological: Negative for dizziness, syncope, speech difficulty, numbness and headaches.  Hematological: Negative for adenopathy. Does not bruise/bleed easily.  Psychiatric/Behavioral: Negative for behavioral problems, confusion and dysphoric mood. The patient is not nervous/anxious and is not hyperactive.     Past Medical History:    Diagnosis Date  . Allergy   . Cardiomyopathy ([place])    DCM with EF 32% on echo 2014 and no ischemia on nuclear stress test  . Chronic back pain    low back  . Chronic systolic CHF (congestive heart failure), NYHA class 2 ([place]) 08/07/2016  . COPD (chronic obstructive pulmonary disease) ([place])   . Dyspnea    with exertion  . EKG, abnormal    history of left bundle block since 2002 per pt  . GERD (gastroesophageal reflux disease)   . Headache   . Hypertension   . LBBB (left bundle branch block)   . OSA on CPAP 08/07/2016  . Oxygen deficiency   . Pain    right side pain for many years  . PTSD (post-traumatic stress disorder)   . Sleep apnea      Social History   Social History  . Marital status: Married    Spouse name: N/A  . Number of children: N/A  . Years of education: N/A   Occupational History  . Not on file.   Social History Main Topics  . Smoking status: Never Smoker  . Smokeless tobacco: Never Used  . Alcohol use No  . Drug use: No  . Sexual activity: Not on file   Other Topics Concern  . Not on file   Social History Narrative   2 years college     Past Surgical History:  Procedure Laterality Date  . ANKLE SURGERY Right   . CARDIAC CATHETERIZATION  yrs ago  . ESOPHAGOGASTRODUODENOSCOPY (EGD) WITH PROPOFOL N/A  01/13/2017   Procedure: ESOPHAGOGASTRODUODENOSCOPY (EGD) WITH PROPOFOL;  Surgeon: Irene Shipper, MD;  Location: [facility];  Service: Endoscopy;  Laterality: N/A;  . HAND SURGERY     Right  . HAND SURGERY     Left carpel tunnel  . THUMB ARTHROSCOPY Left     Family History  Problem Relation Age of Onset  . Heart disease Mother   . Heart disease Father   . Colon cancer Neg Hx   . Esophageal cancer Neg Hx   . Pancreatic cancer Neg Hx   . Stomach cancer Neg Hx     No Known Allergies  Current Outpatient Prescriptions on File Prior to Visit  Medication Sig Dispense Refill  . albuterol (PROVENTIL HFA;VENTOLIN HFA) 108 (90 Base) MCG/ACT  inhaler Inhale 2 puffs into the lungs every 6 (six) hours as needed for wheezing or shortness of breath. 1 Inhaler 0  . amLODipine (NORVASC) 10 MG tablet Take 10 mg by mouth daily.    [name] aspirin 81 MG tablet Take 81 mg by mouth daily.    . carvedilol (COREG) 25 MG tablet Take 25 mg by mouth 2 (two) times daily with a meal.    . cetirizine (ZYRTEC) 10 MG tablet Take 10 mg by mouth daily.    . cyclobenzaprine (FLEXERIL) 10 MG tablet Take 1 tablet (10 mg total) by mouth at bedtime. 14 tablet 0  . digoxin (LANOXIN) 0.125 MG tablet Take 0.125 mg by mouth daily.     [name] doxazosin (CARDURA) 8 MG tablet Take 8 mg by mouth daily.    [name] doxycycline (VIBRA-TABS) 100 MG tablet Take 1 tablet (100 mg total) by mouth 2 (two) times daily. Can give caps or generic 20 tablet 0  . hydrALAZINE (APRESOLINE) 100 MG tablet Take 0.5 tablets (50 mg total) by mouth 3 (three) times daily. 90 tablet 11  . hydrochlorothiazide (HYDRODIURIL) 25 MG tablet TAKE 1 TABLET BY MOUTH ONCE DAILY 90 tablet 2  . ipratropium (ATROVENT HFA) 17 MCG/ACT inhaler Inhale 2 puffs into the lungs every 6 (six) hours as needed for wheezing. 1 Inhaler 3  . irbesartan (AVAPRO) 300 MG tablet Take 1 tablet (300 mg total) by mouth daily. 30 tablet 11  . ISOSORBIDE MONONITRATE PO Take 60 mg by mouth 2 (two) times daily.    . mometasone-formoterol (DULERA) 200-5 MCG/ACT AERO Inhale 2 puffs into the lungs 2 (two) times daily. 13 g 2  . omeprazole (PRILOSEC) 40 MG capsule Take 1 capsule (40 mg total) by mouth 2 (two) times daily. 180 capsule 3  . ondansetron (ZOFRAN ODT) 8 MG disintegrating tablet Take 1 tablet (8 mg total) by mouth every 8 (eight) hours as needed for nausea or vomiting. 20 tablet 0  . ranitidine (ZANTAC) 150 MG capsule Take 1 capsule (150 mg total) by mouth 2 (two) times daily. 60 capsule 0  . spironolactone (ALDACTONE) 25 MG tablet Take 25 mg by mouth daily.    . traMADol (ULTRAM) 50 MG tablet 1-2 every 4 hours as needed for cough or pain 40  tablet 0   No current facility-administered medications on file prior to visit.     BP (!) 167/79   Pulse 71   Temp 98 F (36.7 C) (Oral)   Resp 16   Ht 5\' 9"  (1.753 m)   Wt 198 lb 6.4 oz (90 kg)   SpO2 99%   BMI 29.30 kg/m       Objective:   Physical Exam  General- No acute  distress. Pleasant patient. Neck- Full range of motion, no jvd Lungs- Clear, even and unlabored. Heart- regular rate and rhythm. Neurologic- CNII- XII grossly intact.   Skin- bilateral lower ext slight faint dry flaky skin with small scattered tiny scabs where he scratch.  Rt thumb nail- mid disfigure and yellow appearance.  Rt calf- superficial varicose vein but not tender.  Rt lower ext- neg homans signs.      Assessment & Plan:  For allergic reaction vs dermatis of calf region will rx hydroxyzine and kenalog cream. If area worsens or change let us know. Advise to moisturize well.  For fungus on thumb nail rx penlac.  Counseled on varicose vein of rt calf.  Follow up 2-3 months or as needed  Salimatou Simone, Percell Miller, [employer]

## 2017-02-09 NOTE — Patient Instructions (Addendum)
For allergic reaction vs dermatis of calf region will rx hydroxyzine and kenalog cream. If area worsens or change let us know. Advise to moisturize well.  For fungus on thumb nail rx penlac.  Counseled on varicose vein of rt calf.  Follow up 2-3 months or as needed

## 2017-02-26 ENCOUNTER — Ambulatory Visit: Payer: PRIVATE HEALTH INSURANCE | Admitting: Internal Medicine

## 2017-02-26 DIAGNOSIS — R05 Cough: Secondary | ICD-10-CM

## 2017-02-26 DIAGNOSIS — R059 Cough, unspecified: Secondary | ICD-10-CM

## 2017-02-26 LAB — PULMONARY FUNCTION TEST
DL/VA % pred: 133 %
DL/VA: 6.05 ml/min/mmHg/L
DLCO cor % pred: 97 %
DLCO cor: 29.5 ml/min/mmHg
DLCO unc % pred: 101 %
DLCO unc: 30.68 ml/min/mmHg
FEF 25-75 Pre: 1.84 L/sec
FEF2575-%Pred-Pre: 62 %
FEV1-%Pred-Pre: 69 %
FEV1-Pre: 2.42 L
FEV1FVC-%Pred-Pre: 102 %
FEV6-%Pred-Pre: 70 %
FEV6-Pre: 3.1 L
FEV6FVC-%Pred-Pre: 104 %
FVC-%Pred-Pre: 67 %
FVC-Pre: 3.1 L
Pre FEV1/FVC ratio: 78 %
Pre FEV6/FVC Ratio: 100 %

## 2017-02-26 NOTE — Progress Notes (Addendum)
Was unable to collect useable data.

## 2017-03-11 ENCOUNTER — Encounter: Payer: Self-pay | Admitting: Internal Medicine

## 2017-03-11 ENCOUNTER — Other Ambulatory Visit: Payer: PRIVATE HEALTH INSURANCE

## 2017-03-11 ENCOUNTER — Other Ambulatory Visit (INDEPENDENT_AMBULATORY_CARE_PROVIDER_SITE_OTHER): Payer: PRIVATE HEALTH INSURANCE

## 2017-03-11 ENCOUNTER — Ambulatory Visit (HOSPITAL_BASED_OUTPATIENT_CLINIC_OR_DEPARTMENT_OTHER): Payer: Medicare Other

## 2017-03-11 ENCOUNTER — Ambulatory Visit (INDEPENDENT_AMBULATORY_CARE_PROVIDER_SITE_OTHER): Payer: PRIVATE HEALTH INSURANCE | Admitting: Internal Medicine

## 2017-03-11 VITALS — BP 142/90 | HR 80 | Ht 69.0 in | Wt 201.2 lb

## 2017-03-11 DIAGNOSIS — Z7729 Contact with and (suspected ) exposure to other hazardous substances: Secondary | ICD-10-CM | POA: Diagnosis not present

## 2017-03-11 DIAGNOSIS — R0609 Other forms of dyspnea: Secondary | ICD-10-CM | POA: Insufficient documentation

## 2017-03-11 DIAGNOSIS — Z8709 Personal history of other diseases of the respiratory system: Secondary | ICD-10-CM | POA: Insufficient documentation

## 2017-03-11 DIAGNOSIS — R05 Cough: Secondary | ICD-10-CM | POA: Diagnosis not present

## 2017-03-11 DIAGNOSIS — R0602 Shortness of breath: Secondary | ICD-10-CM

## 2017-03-11 DIAGNOSIS — I5022 Chronic systolic (congestive) heart failure: Secondary | ICD-10-CM

## 2017-03-11 DIAGNOSIS — R053 Chronic cough: Secondary | ICD-10-CM | POA: Insufficient documentation

## 2017-03-11 DIAGNOSIS — R0600 Dyspnea, unspecified: Secondary | ICD-10-CM

## 2017-03-11 LAB — CBC WITH DIFFERENTIAL/PLATELET
Basophils Absolute: 0 10*3/uL (ref 0.0–0.1)
Basophils Relative: 0.5 % (ref 0.0–3.0)
Eosinophils Absolute: 0.1 10*3/uL (ref 0.0–0.7)
Eosinophils Relative: 1.3 % (ref 0.0–5.0)
HCT: 48.9 % (ref 39.0–52.0)
Hemoglobin: 16.5 g/dL (ref 13.0–17.0)
Lymphocytes Relative: 23.3 % (ref 12.0–46.0)
Lymphs Abs: 1.5 10*3/uL (ref 0.7–4.0)
MCHC: 33.7 g/dL (ref 30.0–36.0)
MCV: 95.1 fl (ref 78.0–100.0)
Monocytes Absolute: 0.7 10*3/uL (ref 0.1–1.0)
Monocytes Relative: 10.1 % (ref 3.0–12.0)
Neutro Abs: 4.3 10*3/uL (ref 1.4–7.7)
Neutrophils Relative %: 64.8 % (ref 43.0–77.0)
Platelets: 226 10*3/uL (ref 150.0–400.0)
RBC: 5.15 Mil/uL (ref 4.22–5.81)
RDW: 13.3 % (ref 11.5–15.5)
WBC: 6.6 10*3/uL (ref 4.0–10.5)

## 2017-03-11 LAB — IGA: IgA: 231 mg/dL (ref 68–378)

## 2017-03-11 NOTE — Progress Notes (Signed)
   Subjective:    Patient ID: Matthew Nixon, male    DOB: [birth year], 56 y.o.   MRN: [id number]  HPI    Review of Systems  Constitutional: Positive for unexpected weight change. Negative for fever.  HENT: Positive for sneezing. Negative for congestion, dental problem, ear pain, nosebleeds, postnasal drip, rhinorrhea, sinus pressure, sore throat and trouble swallowing.   Eyes: Negative for redness and itching.  Respiratory: Positive for cough, chest tightness, shortness of breath and wheezing.   Cardiovascular: Negative for palpitations and leg swelling.  Gastrointestinal: Negative for nausea and vomiting.  Genitourinary: Negative for dysuria.  Musculoskeletal: Negative for joint swelling.  Skin: Negative for rash.  Allergic/Immunologic: Positive for environmental allergies. Negative for food allergies and immunocompromised state.  Neurological: Negative for headaches.  Hematological: Does not bruise/bleed easily.  Psychiatric/Behavioral: Negative for dysphoric mood. The patient is not nervous/anxious.        Objective:   Physical Exam        Assessment & Plan:

## 2017-03-11 NOTE — Patient Instructions (Addendum)
ICD-10-CM   1. Chronic cough R05   2. Dyspnea on exertion R06.09   3. Exposure to toxic dust Z77.29   4. Chronic systolic heart failure (HCC) I50.22   5. Personal history of bronchiectasis Z87.09     Do HRCT Do Echo 2D Do blood work IgE, IgM, IgG, IgA, CBc,  with diff, blood allergy profile and alpha 1  Followup Next few week but after completing above to regroup and discuss next steps

## 2017-03-11 NOTE — Progress Notes (Signed)
Subjective:     Patient ID: Matthew Nixon, male   DOB: 1960/07/31, 56 y.o.   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 2002 Dr Candiss Norse in [facility]  And needing advair  since 2009 but no response referred to pulmonary clinic 11/28/2016 by Dr   Harvie Heck    11/28/2016 1st [facility] office visit/ [facility]   Chief Complaint  Patient presents with  . Pulmonary Consult    Referred by Dr. Mackie Pai. Pt c/o cough and wheezing since 2009. 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 He states his breathing gets worse in the winter time.  His cough is prod with yellow sputum and can be triggered by laughing or taking a deep breath. Sputum is occ blood tinged.  He uses an albuterol inhaler 3 x per wk on average.   mucus is worst in am and also at hs like choking  Doe = MMRC3 = can't walk 100 yards even at a slow pace at a flat grade s stopping due to sob    No obvious day to day or daytime variability or assoc  mucus plugs or hemoptysis or cp or chest tightness, subjective wheeze or overt sinus or hb symptoms. No unusual exp hx or h/o childhood pna/ asthma or knowledge of premature birth.   OV 03/11/2017  Chief Complaint  Patient presents with  . Advice Only    Switched from MW to MR. Pt has had a cough since 2001 which will not go away. Pt does cough up yellow mucus at times and also becomes SOB on exertion. Occ. CP when coughs alot.     Second opinion in transfer. 56 year old male. Personally from [place]. 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 After this she started developing shortness of breath and cough. He was diagnosed at [facility]  significant nonischemic cardiomyopathy. Echo and stress test as recently as 2015 documented below shows left ventricular ejection fraction 31%. He is also had significant amount of chronic cough. Also has sleep apnea syndrome which is currently being managed by Dr. Fransico Him in cardiology. He tells me that with evolution of time and shortness of breath is being progressive. It is rated as severe. He notices it for mild to moderate levels of exertion such as climbing stairs or bending in changing clothes. It is relieved by rest. This no associated chest pain. There is significant amount of cough especially early in the morning with mucus and occasionally blood-tinged. He does wake up in the middle of the night with significant amount of cough that can last 25 minutes. Du;lera  and albuterol helped him. In June 2018 Dr. Melvyn Novas stopped his ACE inhibitor but this has not helped resolve the cough. He coughs at all times. He does not feel a tickle in the throat are clear the throat. Cough is so bad that he is unable to do pulmonary function tests effectively. Really could not get a nitric oxide test on him. Overall the course is progressive. Review of the outside medical record from [facility] in [place]. from October 2016 shows that in 2015 he had a CT  scan of the chest in [place] that was pathognomonic for bronchiectasis diffusely. There is a discussion with him about taking daily azithromycin. Currently he does not take it. This documentation he did have a nitric oxide test in 2016 which was 17 ppb but this was done while he was on Dulera. Of note he has been living and [employer] area since 2015. 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 as a main component of cough   [facility] area 03/11/2017    4  Clearing  Of Throat 4  Excess throat mucus or feeling of post na drip 5  Difficulty swallowing food, liquid or  tablets 3  Cough after eating or lying down 5  Breathing difficulties or choking episodes 5  Troublesome or annoying cough 5  Sensation of something sticking in throat or lump in throat 5  Heartburn, chest pain, indigestion, or stomach acid coming up 5  TOTAL 41        Echo aug 2014 from [place] - severe LV hypokinesis diffuse - ef 32% -per report Lexiscan myoview aug 2015 from [place] ef 31% CT abd lung cut April 2018 - looks clear to my personal visualziation CXR 02/09/17 - clear (personally visualized)  Results for CATHAN, GEARIN (MRN [id number]) as of 03/11/2017 10:01  Ref. Range 02/26/2017 14:01  FVC-Pre Latest Units: L 3.10  FVC-%Pred-Pre Latest Units: % 67  Results for JOBE, MUTCH (MRN [id number]) as of 03/11/2017 10:01  Ref. Range 02/26/2017 14:01  FEV1-Pre Latest Units: L 2.42  FEV1-%Pred-Pre Latest Units: % 69  Pre FEV1/FVC ratio Latest Units: % 78  Results for VIRGINIA, FRANCISCO (MRN [id number]) as of 03/11/2017 10:01  Ref. Range 02/26/2017 14:01  DLCO cor Latest Units: ml/min/mmHg 29.50  DLCO cor % pred Latest Units: % 97    Walking desaturation test 185 feet 3 laps and rheumatic: Resting heart rate was 79/m. Peak heart rate was 97/m. Resting pulse ox was 98%. Final pulse ox was also 98%.    has a past medical history of Allergy; Cardiomyopathy ([place]); Chronic back pain; Chronic systolic CHF (congestive heart failure), NYHA class 2 (HCC) (08/07/2016); COPD (chronic obstructive pulmonary disease) ([place]); Dyspnea; EKG, abnormal; GERD (gastroesophageal reflux disease); Headache; Hypertension; LBBB (left bundle branch block); OSA on CPAP (08/07/2016); Oxygen deficiency; Pain; PTSD (post-traumatic stress disorder); and Sleep apnea.   reports that he has never smoked. He has never used smokeless tobacco.  Past Surgical History:  Procedure Laterality Date  . ANKLE SURGERY Right   . CARDIAC CATHETERIZATION  yrs ago  . ESOPHAGOGASTRODUODENOSCOPY (EGD) WITH PROPOFOL  N/A 01/13/2017   Procedure: ESOPHAGOGASTRODUODENOSCOPY (EGD) WITH PROPOFOL;  Surgeon: Irene Shipper, MD;  Location: [facility];  Service: Endoscopy;  Laterality: N/A;  . HAND SURGERY     Right  . HAND SURGERY     Left carpel tunnel  . THUMB ARTHROSCOPY Left     No Known Allergies   There is no immunization history on file for this patient.  Family History  Problem Relation Age of Onset  . Heart disease Mother   . Heart disease Father   . Colon cancer Neg Hx   . Esophageal cancer Neg Hx   . Pancreatic cancer Neg Hx   . Stomach cancer Neg Hx      Current Outpatient Prescriptions:  .  albuterol (PROVENTIL HFA;VENTOLIN HFA) 108 (90 Base) MCG/ACT inhaler, Inhale 2 puffs into the lungs  every 6 (six) hours as needed for wheezing or shortness of breath., Disp: 1 Inhaler, Rfl: 0 .  amLODipine (NORVASC) 10 MG tablet, Take 10 mg by mouth daily., Disp: , Rfl:  .  aspirin 81 MG tablet, Take 81 mg by mouth daily., Disp: , Rfl:  .  carvedilol (COREG) 25 MG tablet, Take 25 mg by mouth 2 (two) times daily with a meal., Disp: , Rfl:  .  cetirizine (ZYRTEC) 10 MG tablet, Take 10 mg by mouth daily., Disp: , Rfl:  .  ciclopirox (PENLAC) 8 % solution, Apply topically at bedtime. Apply over nail and surrounding skin. Apply daily over previous coat. After seven (7) days, may remove with alcohol and continue cycle., Disp: 6.6 mL, Rfl: 0 .  cyclobenzaprine (FLEXERIL) 10 MG tablet, Take 1 tablet (10 mg total) by mouth at bedtime., Disp: 14 tablet, Rfl: 0 .  digoxin (LANOXIN) 0.125 MG tablet, Take 0.125 mg by mouth daily. , Disp: , Rfl:  .  doxazosin (CARDURA) 8 MG tablet, Take 8 mg by mouth daily., Disp: , Rfl:  .  hydrALAZINE (APRESOLINE) 100 MG tablet, Take 0.5 tablets (50 mg total) by mouth 3 (three) times daily., Disp: 90 tablet, Rfl: 11 .  hydrochlorothiazide (HYDRODIURIL) 25 MG tablet, TAKE 1 TABLET BY MOUTH ONCE DAILY, Disp: 90 tablet, Rfl: 2 .  hydrOXYzine (ATARAX/VISTARIL) 10 MG tablet, 1-2 tab  po q 8 hours as needed itching., Disp: 30 tablet, Rfl: 0 .  ipratropium (ATROVENT HFA) 17 MCG/ACT inhaler, Inhale 2 puffs into the lungs every 6 (six) hours as needed for wheezing., Disp: 1 Inhaler, Rfl: 3 .  irbesartan (AVAPRO) 300 MG tablet, Take 1 tablet (300 mg total) by mouth daily., Disp: 30 tablet, Rfl: 11 .  ISOSORBIDE MONONITRATE PO, Take 60 mg by mouth 2 (two) times daily., Disp: , Rfl:  .  mometasone-formoterol (DULERA) 200-5 MCG/ACT AERO, Inhale 2 puffs into the lungs 2 (two) times daily., Disp: 13 g, Rfl: 2 .  omeprazole (PRILOSEC) 40 MG capsule, Take 1 capsule (40 mg total) by mouth 2 (two) times daily., Disp: 180 capsule, Rfl: 3 .  ondansetron (ZOFRAN ODT) 8 MG disintegrating tablet, Take 1 tablet (8 mg total) by mouth every 8 (eight) hours as needed for nausea or vomiting., Disp: 20 tablet, Rfl: 0 .  ranitidine (ZANTAC) 150 MG capsule, Take 1 capsule (150 mg total) by mouth 2 (two) times daily., Disp: 60 capsule, Rfl: 0 .  spironolactone (ALDACTONE) 25 MG tablet, Take 25 mg by mouth daily., Disp: , Rfl:  .  traMADol (ULTRAM) 50 MG tablet, 1-2 every 4 hours as needed for cough or pain, Disp: 40 tablet, Rfl: 0 .  triamcinolone cream (KENALOG) 0.5 %, Apply 1 application topically 3 (three) times daily., Disp: 30 g, Rfl: 0   Review of Systems     Objective:   Physical Exam  Constitutional: He is oriented to person, place, and time. He appears well-developed and well-nourished. No distress.  Coughs a lot  HENT:  Head: Normocephalic and atraumatic.  Right Ear: External ear normal.  Left Ear: External ear normal.  Mouth/Throat: Oropharynx is clear and moist. No oropharyngeal exudate.  Eyes: Pupils are equal, round, and reactive to light. Conjunctivae and EOM are normal. Right eye exhibits no discharge. Left eye exhibits no discharge. No scleral icterus.  Neck: Normal range of motion. Neck supple. No JVD present. No tracheal deviation present. No thyromegaly present.   Cardiovascular: Normal rate, regular rhythm and intact distal pulses.  Exam  reveals no gallop and no friction rub.   No murmur heard. Pulmonary/Chest: Effort normal and breath sounds normal. No respiratory distress. He has no wheezes. He has no rales. He exhibits no tenderness.  Abdominal: Soft. Bowel sounds are normal. He exhibits no distension and no mass. There is no tenderness. There is no rebound and no guarding.  Musculoskeletal: Normal range of motion. He exhibits no edema or tenderness.  Lymphadenopathy:    He has no cervical adenopathy.  Neurological: He is alert and oriented to person, place, and time. He has normal reflexes. No cranial nerve deficit. Coordination normal.  Skin: Skin is warm and dry. No rash noted. He is not diaphoretic. No erythema. No pallor.  Psychiatric: He has a normal mood and affect. His behavior is normal. Judgment and thought content normal.  Nursing note and vitals reviewed.  Vitals:   03/11/17 1012  BP: (!) 142/90  Pulse: 80  SpO2: 99%  Weight: 201 lb 3.2 oz (91.3 kg)  Height: 5\' 9"  (1.753 m)    Estimated body mass index is 29.71 kg/m as calculated from the following:   Height as of this encounter: 5\' 9"  (1.753 m).   Weight as of this encounter: 201 lb 3.2 oz (91.3 kg).     Assessment:       ICD-10-CM   1. Chronic cough R05   2. Dyspnea on exertion R06.09   3. Exposure to toxic dust Z77.29   4. Chronic systolic heart failure (HCC) I50.22   5. Personal history of bronchiectasis Z87.09        Plan:      In terms of shortness of breath this is most likely due to deconditioning and also cardiomyopathy.  In terms of cough: Most likely this is due to irritable larynx syndrome or cough neuropathy although the underlying bronchiectasis could be playing a role he does not have much sputum  However it is important that the start from scratch with workup. He could not do nitric oxide test today. He should continue his Dulera as though he might  have asthma. I want to reassess his cardiac status with an echocardiogram. Also want to reassess his bronchiectasis with a high-resolution CT chest  And get Some bronchietasis and allergy related blood work     Might consider CPST as next step depending on reesults   > 50% of this > 40 min visit spent in face to face counseling or/and coordination of care    Dr. Brand Males, M.D., [facility].C.P Pulmonary and Critical Care Medicine Staff Physician, [place] Director - Interstitial Lung Disease  Pulmonary [place] at [facility], [place], [address]  Pager: [phone], If no answer or between  15:00h - 7:00h: call [phone] Telephone: [phone]

## 2017-03-13 ENCOUNTER — Ambulatory Visit (HOSPITAL_BASED_OUTPATIENT_CLINIC_OR_DEPARTMENT_OTHER)
Admission: RE | Admit: 2017-03-13 | Discharge: 2017-03-13 | Disposition: A | Discharge status: Home / Self Care | Payer: Commercial Managed Care - PPO | Source: Ambulatory Visit | Attending: Internal Medicine | Admitting: Internal Medicine

## 2017-03-13 ENCOUNTER — Ambulatory Visit (HOSPITAL_BASED_OUTPATIENT_CLINIC_OR_DEPARTMENT_OTHER): Payer: Medicare Other

## 2017-03-13 ENCOUNTER — Telehealth: Payer: Self-pay | Admitting: Internal Medicine

## 2017-03-13 ENCOUNTER — Ambulatory Visit (HOSPITAL_BASED_OUTPATIENT_CLINIC_OR_DEPARTMENT_OTHER): Admission: RE | Admit: 2017-03-13 | Payer: Medicare Other | Source: Ambulatory Visit

## 2017-03-13 DIAGNOSIS — I340 Nonrheumatic mitral (valve) insufficiency: Secondary | ICD-10-CM | POA: Diagnosis not present

## 2017-03-13 DIAGNOSIS — R053 Chronic cough: Secondary | ICD-10-CM

## 2017-03-13 DIAGNOSIS — G4730 Sleep apnea, unspecified: Secondary | ICD-10-CM | POA: Diagnosis not present

## 2017-03-13 DIAGNOSIS — R0602 Shortness of breath: Secondary | ICD-10-CM | POA: Insufficient documentation

## 2017-03-13 DIAGNOSIS — R05 Cough: Secondary | ICD-10-CM

## 2017-03-13 DIAGNOSIS — I447 Left bundle-branch block, unspecified: Secondary | ICD-10-CM | POA: Diagnosis not present

## 2017-03-13 DIAGNOSIS — J441 Chronic obstructive pulmonary disease with (acute) exacerbation: Secondary | ICD-10-CM

## 2017-03-13 NOTE — Telephone Encounter (Signed)
Let Matthew Nixon know that his EF is 40-45% on echo and this can explain a lot his shortness of breath. He needs to follow this with his cardioogist  Dr. Brand Males, M.D., [facility].C.P Pulmonary and Critical Care Medicine Staff Physician [place] [facility] Pager: [phone], If no answer or between  15:00h - 7:00h: call [phone]  03/13/2017 3:31 PM   g

## 2017-03-13 NOTE — Progress Notes (Signed)
  Echocardiogram  2D Echocardiogram has been performed.  Darlina Sicilian M 03/13/2017, 12:43 PM

## 2017-03-14 LAB — RESPIRATORY ALLERGY PROFILE REGION II ~~LOC~~
Allergen, A. alternata, m6: <0.1 kU/L
Allergen, Cedar tree, t12: <0.1 kU/L
Allergen, Comm Silver Birch, t9: 4.71 kU/L — ABNORMAL HIGH
Allergen, Cottonwood, t14: <0.1 kU/L
Allergen, D pternoyssinus,d7: <0.1 kU/L
Allergen, Mouse Urine Protein, e78: <0.1 kU/L
Allergen, Mulberry, t76: <0.1 kU/L
Allergen, Oak,t7: 0.16 kU/L — ABNORMAL HIGH
Allergen, P. notatum, m1: <0.1 kU/L
Aspergillus fumigatus, m3: <0.1 kU/L
Bermuda Grass: <0.1 kU/L
Box Elder IgE: <0.1 kU/L
CLADOSPORIUM HERBARUM (M2) IGE: <0.1 kU/L
COMMON RAGWEED (SHORT) (W1) IGE: <0.1 kU/L
Cat Dander: <0.1 kU/L
Class: 0
Class: 0
Class: 0
Class: 0
Class: 0
Class: 0
Class: 0
Class: 0
Class: 0
Class: 0
Class: 0
Class: 0
Class: 0
Class: 0
Class: 0
Class: 0
Class: 0
Class: 0
Class: 0
Class: 0
Class: 0
Class: 0
Class: 0
Class: 3
Cockroach: <0.1 kU/L
D. farinae: <0.1 kU/L
Dog Dander: <0.1 kU/L
Elm IgE: <0.1 kU/L
IgE (Immunoglobulin E), Serum: 13 kU/L (ref <=114)
Johnson Grass: <0.1 kU/L
Pecan/Hickory Tree IgE: <0.1 kU/L
Rough Pigweed  IgE: <0.1 kU/L
Sheep Sorrel IgE: <0.1 kU/L
Timothy Grass: <0.1 kU/L

## 2017-03-14 LAB — ALPHA-1 ANTITRYPSIN PHENOTYPE: A-1 Antitrypsin, Ser: 127 mg/dL (ref 83–199)

## 2017-03-14 LAB — IGM: IgM, Serum: 37 mg/dL — ABNORMAL LOW (ref 48–271)

## 2017-03-14 LAB — INTERPRETATION:

## 2017-03-14 LAB — IGG: IgG (Immunoglobin G), Serum: 1103 mg/dL (ref 694–1618)

## 2017-03-17 NOTE — Telephone Encounter (Signed)
Called and spoke to pt. Informed him of the results and recs per MR. Pt is requesting the results of the PFT that was completed on 02/26/2017. Advised pt that the results of the PFT may not be able to be accurate as the data is not within 10% repeatability.  Pt is still requesting to get these results interpreted. Pt also requested to have records mailed to his home address. This has been placed in outgoing mail.   MR please advise on pt's PFT results. Thanks.

## 2017-03-19 ENCOUNTER — Other Ambulatory Visit (HOSPITAL_COMMUNITY): Payer: PRIVATE HEALTH INSURANCE

## 2017-03-24 NOTE — Telephone Encounter (Signed)
Called pt to let him know the results of his labwork and that we were needing him to come in next week to have the IgM level repeated. Also told pt the results of his CT scan.  When told pt that all the labwork was mostly normal but the IgM level was borderline low, he got very confused about that and told me that he would have to call a company to see if they would okay him to come back next week to have the IgM level repeated.   Will leave this message open until I hear back from pt.  If pt is going to be able to come back to have the labwork repeated, will place order then.

## 2017-03-24 NOTE — Telephone Encounter (Signed)
Further updates for [employer]   - Immune globulin levels - basically normal for IgG, IgE and IgA but IgM borderline low. He is low on IgM per the lab but per uptodate text book is right at the lowre limit of normal (37). If this is really a true deficiency is a rare disorder and I have NO expereince on that. PLAN: repeat IgM level next week or so  -  CT chest - no cancer, no fibrosis, no bronchiectasis no pneumonia. Just some mild edema from heart - he should consider lasix or increasing lasix after d/w cards - might help dyspnea  - Blood allergy profile sept 2018_ mostly normal  0- alpha 1  - normal  - pft results: you can give him  Dr. Brand Males, M.D., [facility].C.P Pulmonary and Critical Care Medicine Staff Physician [place] [facility] and Critical Care Pager: [phone], If no answer or between  15:00h - 7:00h: call [phone]  03/24/2017 10:02 AM

## 2017-03-24 NOTE — Telephone Encounter (Signed)
atc pt, line rang to fast busy signal.   Routing to [place] for follow-up.

## 2017-03-26 ENCOUNTER — Ambulatory Visit: Payer: Medicare Other | Admitting: Internal Medicine

## 2017-04-06 DIAGNOSIS — I5022 Chronic systolic (congestive) heart failure: Secondary | ICD-10-CM | POA: Diagnosis not present

## 2017-04-06 DIAGNOSIS — I340 Nonrheumatic mitral (valve) insufficiency: Secondary | ICD-10-CM | POA: Diagnosis not present

## 2017-04-06 DIAGNOSIS — I10 Essential (primary) hypertension: Secondary | ICD-10-CM | POA: Diagnosis not present

## 2017-04-07 NOTE — Telephone Encounter (Signed)
Called and spoke with patient. Patient is in [place] at this time but will be back next week. Patient will come in at that time for the repeat lab work. I have placed the order for this to be done.

## 2017-04-09 NOTE — Telephone Encounter (Signed)
Patient has a compliance appointment of April 28 2017 at 3:20. Patient understands he has to keep this appointment for insurance compliance.

## 2017-04-10 ENCOUNTER — Encounter: Payer: Self-pay | Admitting: Medical

## 2017-04-10 ENCOUNTER — Ambulatory Visit (INDEPENDENT_AMBULATORY_CARE_PROVIDER_SITE_OTHER): Payer: Medicare Other | Admitting: Medical

## 2017-04-10 VITALS — BP 158/80 | HR 78 | Temp 97.8°F | Resp 16 | Ht 69.0 in | Wt 202.2 lb

## 2017-04-10 DIAGNOSIS — B49 Unspecified mycosis: Secondary | ICD-10-CM | POA: Diagnosis not present

## 2017-04-10 DIAGNOSIS — I10 Essential (primary) hypertension: Secondary | ICD-10-CM | POA: Diagnosis not present

## 2017-04-10 LAB — COMPREHENSIVE METABOLIC PANEL
AG Ratio: 1.7 (calc) (ref 1.0–2.5)
ALT: 32 U/L (ref 9–46)
AST: 20 U/L (ref 10–35)
Albumin: 4.5 g/dL (ref 3.6–5.1)
Alkaline phosphatase (APISO): 67 U/L (ref 40–115)
BUN: 16 mg/dL (ref 7–25)
CO2: 28 mmol/L (ref 20–32)
Calcium: 9.8 mg/dL (ref 8.6–10.3)
Chloride: 104 mmol/L (ref 98–110)
Creat: 0.94 mg/dL (ref 0.70–1.33)
Globulin: 2.7 g/dL (calc) (ref 1.9–3.7)
Glucose, Bld: 110 mg/dL — ABNORMAL HIGH (ref 65–99)
Potassium: 4.2 mmol/L (ref 3.5–5.3)
Sodium: 143 mmol/L (ref 135–146)
Total Bilirubin: 0.4 mg/dL (ref 0.2–1.2)
Total Protein: 7.2 g/dL (ref 6.1–8.1)

## 2017-04-10 MED ORDER — TERBINAFINE HCL 250 MG PO TABS: 250.0000 mg | ORAL_TABLET | Freq: Every day | ORAL | 0 refills | Status: DC

## 2017-04-10 NOTE — Progress Notes (Signed)
Subjective:    Patient ID: Matthew Nixon, male    DOB: 1961-02-20, 56 y.o.   MRN: [id number]  HPI  Pt in for evaluation.  He has discoloration of his rt thumb nail for years. He tried lamisil otc and most recently penlac. But the area did not get better. Now he saw some discoloration to left 5th digit.  Pt in the past had used lamisil oral for toenail fungal infection. He wants to know if he can use this again.(if used med he wants to be cautious and get lft's checked)     Review of Systems  Constitutional: Negative for chills, fatigue and fever.  Respiratory: Negative for cough, choking, shortness of breath and wheezing.   Cardiovascular: Negative for chest pain and palpitations.  Gastrointestinal: Negative for abdominal pain.  Skin:       See hpi   Past Medical History:  Diagnosis Date  . Allergy   . Cardiomyopathy ([place])    DCM with EF 32% on echo 2014 and no ischemia on nuclear stress test  . Chronic back pain    low back  . Chronic systolic CHF (congestive heart failure), NYHA class 2 ([place]) 08/07/2016  . COPD (chronic obstructive pulmonary disease) ()   . Dyspnea    with exertion  . EKG, abnormal    history of left bundle block since 2002 per pt  . GERD (gastroesophageal reflux disease)   . Headache   . Hypertension   . LBBB (left bundle branch block)   . OSA on CPAP 08/07/2016  . Oxygen deficiency   . Pain    right side pain for many years  . PTSD (post-traumatic stress disorder)   . Sleep apnea      Social History   Social History  . Marital status: Married    Spouse name: N/A  . Number of children: N/A  . Years of education: N/A   Occupational History  . Not on file.   Social History Main Topics  . Smoking status: Never Smoker  . Smokeless tobacco: Never Used  . Alcohol use No  . Drug use: No  . Sexual activity: Not on file   Other Topics Concern  . Not on file   Social History Narrative   2 years college     Past Surgical History:    Procedure Laterality Date  . ANKLE SURGERY Right   . CARDIAC CATHETERIZATION  yrs ago  . ESOPHAGOGASTRODUODENOSCOPY (EGD) WITH PROPOFOL N/A 01/13/2017   Procedure: ESOPHAGOGASTRODUODENOSCOPY (EGD) WITH PROPOFOL;  Surgeon: Irene Shipper, MD;  Location: [facility];  Service: Endoscopy;  Laterality: N/A;  . HAND SURGERY     Right  . HAND SURGERY     Left carpel tunnel  . THUMB ARTHROSCOPY Left     Family History  Problem Relation Age of Onset  . Heart disease Mother   . Heart disease Father   . Colon cancer Neg Hx   . Esophageal cancer Neg Hx   . Pancreatic cancer Neg Hx   . Stomach cancer Neg Hx     No Known Allergies  Current Outpatient Prescriptions on File Prior to Visit  Medication Sig Dispense Refill  . albuterol (PROVENTIL HFA;VENTOLIN HFA) 108 (90 Base) MCG/ACT inhaler Inhale 2 puffs into the lungs every 6 (six) hours as needed for wheezing or shortness of breath. 1 Inhaler 0  . amLODipine (NORVASC) 10 MG tablet Take 10 mg by mouth daily.    [name] aspirin 81 MG tablet Take  81 mg by mouth daily.    . carvedilol (COREG) 25 MG tablet Take 25 mg by mouth 2 (two) times daily with a meal.    . cetirizine (ZYRTEC) 10 MG tablet Take 10 mg by mouth daily.    . ciclopirox (PENLAC) 8 % solution Apply topically at bedtime. Apply over nail and surrounding skin. Apply daily over previous coat. After seven (7) days, may remove with alcohol and continue cycle. 6.6 mL 0  . cyclobenzaprine (FLEXERIL) 10 MG tablet Take 1 tablet (10 mg total) by mouth at bedtime. 14 tablet 0  . digoxin (LANOXIN) 0.125 MG tablet Take 0.125 mg by mouth daily.     [name] doxazosin (CARDURA) 8 MG tablet Take 8 mg by mouth daily.    . hydrALAZINE (APRESOLINE) 100 MG tablet Take 0.5 tablets (50 mg total) by mouth 3 (three) times daily. 90 tablet 11  . hydrochlorothiazide (HYDRODIURIL) 25 MG tablet TAKE 1 TABLET BY MOUTH ONCE DAILY 90 tablet 2  . hydrOXYzine (ATARAX/VISTARIL) 10 MG tablet 1-2 tab po q 8 hours as needed  itching. 30 tablet 0  . ipratropium (ATROVENT HFA) 17 MCG/ACT inhaler Inhale 2 puffs into the lungs every 6 (six) hours as needed for wheezing. 1 Inhaler 3  . irbesartan (AVAPRO) 300 MG tablet Take 1 tablet (300 mg total) by mouth daily. 30 tablet 11  . ISOSORBIDE MONONITRATE PO Take 60 mg by mouth 2 (two) times daily.    . mometasone-formoterol (DULERA) 200-5 MCG/ACT AERO Inhale 2 puffs into the lungs 2 (two) times daily. 13 g 2  . omeprazole (PRILOSEC) 40 MG capsule Take 1 capsule (40 mg total) by mouth 2 (two) times daily. 180 capsule 3  . ondansetron (ZOFRAN ODT) 8 MG disintegrating tablet Take 1 tablet (8 mg total) by mouth every 8 (eight) hours as needed for nausea or vomiting. 20 tablet 0  . ranitidine (ZANTAC) 150 MG capsule Take 1 capsule (150 mg total) by mouth 2 (two) times daily. 60 capsule 0  . spironolactone (ALDACTONE) 25 MG tablet Take 25 mg by mouth daily.    . traMADol (ULTRAM) 50 MG tablet 1-2 every 4 hours as needed for cough or pain 40 tablet 0  . triamcinolone cream (KENALOG) 0.5 % Apply 1 application topically 3 (three) times daily. 30 g 0   No current facility-administered medications on file prior to visit.     BP (!) 158/80   Pulse 78   Temp 97.8 F (36.6 C) (Oral)   Resp 16   Ht 5\' 9"  (1.753 m)   Wt 202 lb 3.2 oz (91.7 kg)   SpO2 99%   BMI 29.86 kg/m       Objective:   Physical Exam  General- No acute distress. Pleasant patient. Neck- Full range of motion, no jvd Lungs- Clear, even and unlabored. Heart- regular rate and rhythm. Neurologic- CNII- XII grossly intact.  Skin- rt thumb nail faint discolored yellow and rough feel to nail. Left 5th digit nail- faint discolored at tip. 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   I want you to clip nails of effected digits and bring in nail sample for koh testing. Go ahead and get  cmp today. Will check lft. When koh back and if positive then start lamisil.   When lamsil started would repeat cmp at 3 weeks and again at 6-7 weeks.  [place], Percell Miller, PA-C

## 2017-04-10 NOTE — Patient Instructions (Addendum)
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   I want you to clip nails of effected digits and bring in nail sample for koh testing. Go ahead and get cmp today. Will check lft. When koh back and if positive then start lamisil.   When lamsil started would repeat cmp at 3 weeks and again at 6-7 weeks.

## 2017-04-13 ENCOUNTER — Other Ambulatory Visit: Payer: Self-pay

## 2017-04-13 DIAGNOSIS — R739 Hyperglycemia, unspecified: Secondary | ICD-10-CM

## 2017-04-14 ENCOUNTER — Other Ambulatory Visit (INDEPENDENT_AMBULATORY_CARE_PROVIDER_SITE_OTHER): Payer: Medicare Other

## 2017-04-14 DIAGNOSIS — R739 Hyperglycemia, unspecified: Secondary | ICD-10-CM | POA: Diagnosis not present

## 2017-04-14 LAB — HEMOGLOBIN A1C: Hgb A1c MFr Bld: 5.6 % (ref 4.6–6.5)

## 2017-04-14 NOTE — Addendum Note (Signed)
Addended by: Harl Bowie on: 04/14/2017 09:46 AM   Modules accepted: Orders

## 2017-04-22 ENCOUNTER — Encounter: Payer: Self-pay | Admitting: Cardiology

## 2017-04-28 ENCOUNTER — Encounter: Payer: Self-pay | Admitting: Cardiology

## 2017-04-28 ENCOUNTER — Ambulatory Visit (INDEPENDENT_AMBULATORY_CARE_PROVIDER_SITE_OTHER): Payer: PRIVATE HEALTH INSURANCE | Admitting: Cardiology

## 2017-04-28 VITALS — BP 134/72 | HR 64 | Ht 69.0 in | Wt 203.4 lb

## 2017-04-28 DIAGNOSIS — I10 Essential (primary) hypertension: Secondary | ICD-10-CM

## 2017-04-28 DIAGNOSIS — I5022 Chronic systolic (congestive) heart failure: Secondary | ICD-10-CM | POA: Diagnosis not present

## 2017-04-28 DIAGNOSIS — Z9989 Dependence on other enabling machines and devices: Secondary | ICD-10-CM | POA: Diagnosis not present

## 2017-04-28 DIAGNOSIS — G4733 Obstructive sleep apnea (adult) (pediatric): Secondary | ICD-10-CM | POA: Diagnosis not present

## 2017-04-28 DIAGNOSIS — I420 Dilated cardiomyopathy: Secondary | ICD-10-CM

## 2017-04-28 MED ORDER — MELATONIN 1 MG PO CAPS: 1.0000 mg | ORAL_CAPSULE | Freq: Every day | ORAL | 0 refills | Status: DC

## 2017-04-28 NOTE — Progress Notes (Signed)
Cardiology Office Note:    Date:  04/28/2017   ID:  Matthew Nixon, DOB [birth year], MRN [id number]  PCP:  Mackie Pai, PA-C  Cardiologist:  Fransico Him, MD   Referring MD: Elise Benne   Chief Complaint  Patient presents with  . Sleep Apnea  . Cardiomyopathy  . Congestive Heart Failure  . Hypertension    History of Present Illness:    Matthew Nixon is a 56 y.o. male with a hx of  DCM with EF 32% (echo 2014) and no ischemia on nuclear stress, chronic systolic heart failure NYHA class II, COPD, hypertension, left bundle branch block and obstructive sleep apnea.  He is here today for follow-up.  When I saw him back in February he was not able to use his CPAP because his mask was leaking and his device was old.  We ordered a new CPAP device but he had to undergo a sleep study first.   This showed mild obstructive sleep apnea with an overall AHI of 7.6/h with severe sleep apnea during REM sleep with an AHI 27.7/h.  Oxygen desaturations dropped as low as 86%.  He underwent CPAP titration to 9cm H2O.    He is here today for followup and is doing well.  He denies any chest pain or pressure, SOB, DOE, PND, orthopnea, LE edema, dizziness, palpitations or syncope. He is compliant with his meds and is tolerating meds with no SE. He is doing well with his CPAP device and thinks that he has gotten used to it.  He tolerates the mask and feels the pressure is adequate.  Since going on CPAP he feels rested in the am and has no significant daytime sleepiness.  He denies any significant mouth or nasal dryness or nasal congestion.  He does not think that he snores.       Past Medical History:  Diagnosis Date  . Allergy   . Cardiomyopathy ([place])    DCM with EF 32% on echo 2014 and no ischemia on nuclear stress test.  EF improved to 40-45% by echo 02/2017  . Chronic back pain    low back  . Chronic systolic CHF (congestive heart failure), NYHA class 2 ([place]) 08/07/2016  . COPD (chronic  obstructive pulmonary disease) ([place])   . Dyspnea    with exertion  . EKG, abnormal    history of left bundle block since 2002 per pt  . GERD (gastroesophageal reflux disease)   . Headache   . Hypertension   . LBBB (left bundle branch block)   . OSA on CPAP 08/07/2016  . Oxygen deficiency   . Pain    right side pain for many years  . PTSD (post-traumatic stress disorder)   . Sleep apnea     Past Surgical History:  Procedure Laterality Date  . ANKLE SURGERY Right   . CARDIAC CATHETERIZATION  yrs ago  . HAND SURGERY     Right  . HAND SURGERY     Left carpel tunnel  . THUMB ARTHROSCOPY Left     Current Medications: Current Meds  Medication Sig  . albuterol (PROVENTIL HFA;VENTOLIN HFA) 108 (90 Base) MCG/ACT inhaler Inhale 2 puffs into the lungs every 6 (six) hours as needed for wheezing or shortness of breath.  [name] amLODipine (NORVASC) 10 MG tablet Take 10 mg by mouth daily.  [name] aspirin 81 MG tablet Take 81 mg by mouth daily.  . carvedilol (COREG) 25 MG tablet Take 25 mg by mouth 2 (two) times daily  with a meal.  . cetirizine (ZYRTEC) 10 MG tablet Take 10 mg by mouth daily.  . ciclopirox (PENLAC) 8 % solution Apply topically at bedtime. Apply over nail and surrounding skin. Apply daily over previous coat. After seven (7) days, may remove with alcohol and continue cycle.  . cyclobenzaprine (FLEXERIL) 10 MG tablet Take 1 tablet (10 mg total) by mouth at bedtime.  . digoxin (LANOXIN) 0.125 MG tablet Take 0.125 mg by mouth daily.   [name] doxazosin (CARDURA) 8 MG tablet Take 8 mg by mouth daily.  . hydrALAZINE (APRESOLINE) 100 MG tablet Take 0.5 tablets (50 mg total) by mouth 3 (three) times daily.  . hydrochlorothiazide (HYDRODIURIL) 25 MG tablet TAKE 1 TABLET BY MOUTH ONCE DAILY  . hydrOXYzine (ATARAX/VISTARIL) 10 MG tablet 1-2 tab po q 8 hours as needed itching.  [name] ipratropium (ATROVENT HFA) 17 MCG/ACT inhaler Inhale 2 puffs into the lungs every 6 (six) hours as needed for wheezing.  .  irbesartan (AVAPRO) 300 MG tablet Take 1 tablet (300 mg total) by mouth daily.  . ISOSORBIDE MONONITRATE PO Take 60 mg by mouth 2 (two) times daily.  . mometasone-formoterol (DULERA) 200-5 MCG/ACT AERO Inhale 2 puffs into the lungs 2 (two) times daily.  [name] omeprazole (PRILOSEC) 40 MG capsule Take 1 capsule (40 mg total) by mouth 2 (two) times daily.  . ondansetron (ZOFRAN ODT) 8 MG disintegrating tablet Take 1 tablet (8 mg total) by mouth every 8 (eight) hours as needed for nausea or vomiting.  . ranitidine (ZANTAC) 150 MG capsule Take 1 capsule (150 mg total) by mouth 2 (two) times daily.  [name] spironolactone (ALDACTONE) 25 MG tablet Take 25 mg by mouth daily.  [name] terbinafine (LAMISIL) 250 MG tablet Take 1 tablet (250 mg total) by mouth daily.  . traMADol (ULTRAM) 50 MG tablet 1-2 every 4 hours as needed for cough or pain  . triamcinolone cream (KENALOG) 0.5 % Apply 1 application topically 3 (three) times daily.     Allergies:   Patient has no known allergies.   Social History   Socioeconomic History  . Marital status: Married    Spouse name: None  . Number of children: None  . Years of education: None  . Highest education level: None  Social Needs  . Financial resource strain: None  . Food insecurity - worry: None  . Food insecurity - inability: None  . Transportation needs - medical: None  . Transportation needs - non-medical: None  Occupational History  . None  Tobacco Use  . Smoking status: Never Smoker  . Smokeless tobacco: Never Used  Substance and Sexual Activity  . Alcohol use: No  . Drug use: No  . Sexual activity: None  Other Topics Concern  . None  Social History Narrative   2 years college      Family History: The patient's family history includes Heart disease in his father and mother. There is no history of Colon cancer, Esophageal cancer, Pancreatic cancer, or Stomach cancer.  ROS:   Please see the history of present illness.    ROS  All other systems  reviewed and negative.   EKGs/Labs/Other Studies Reviewed:    The following studies were reviewed today: CPAP download  EKG:  EKG is not ordered today.  Recent Labs: 09/22/2016: Pro B Natriuretic peptide (BNP) 59.0 03/11/2017: Hemoglobin 16.5; Platelets 226.0 04/10/2017: ALT 32; BUN 16; Creat 0.94; Potassium 4.2; Sodium 143   Recent Lipid Panel    Component Value Date/Time  CHOL 159 03/18/2016 0708   TRIG 118.0 03/18/2016 0708   HDL 38.90 (L) 03/18/2016 0708   CHOLHDL 4 03/18/2016 0708   VLDL 23.6 03/18/2016 0708   LDLCALC 96 03/18/2016 0708    Physical Exam:    VS:  BP 134/72   Pulse 64   Ht _0  (1.753 m)   Wt 203 lb 6.4 oz (92.3 kg)   SpO2 98%   BMI 30.04 kg/m     Wt Readings from Last 3 Encounters:  04/28/17 203 lb 6.4 oz (92.3 kg)  04/10/17 202 lb 3.2 oz (91.7 kg)  03/11/17 201 lb 3.2 oz (91.3 kg)     GEN:  Well nourished, well developed in no acute distress HEENT: Normal NECK: No JVD; No carotid bruits LYMPHATICS: No lymphadenopathy CARDIAC: RRR, no murmurs, rubs, gallops RESPIRATORY:  Clear to auscultation without rales, wheezing or rhonchi  ABDOMEN: Soft, non-tender, non-distended MUSCULOSKELETAL:  No edema; No deformity  SKIN: Warm and dry NEUROLOGIC:  Alert and oriented x 3 PSYCHIATRIC:  Normal affect   ASSESSMENT:    1. Chronic systolic heart failure ([place])   2. Dilated cardiomyopathy ([place])   3. Essential hypertension   4. OSA on CPAP    PLAN:    In order of problems listed above:  1.  Chronic systolic heart failure-he appears euvolemic on exam today and his weight is stable.  He will continue on Imdur 60 mg twice daily, hydralazine 50 mg 3 times daily, irbesartan 300 mg daily, Aldactone 25 mg daily, carvedilol 25 mg twice daily.  2.  Dilated cardiomyopathy- repeat 2D echocardiogram done 03/13/2017 showed improvement in heart function with an EF now at 40-45%.  There was mild MR.  Nuclear stress test in the past that showed no inducible  ischemia and presumed to be HTN in etiology.  3.  Hypertension-blood pressure well controlled on exam today.  He will continue on doxazosin 8 mg daily, hydralazine 50 mg 3 times daily, irbesartan 300 mg daily, Spironolactone 25 mg daily, carvedilol 25 mg twice daily, amlodipine 10 mg daily, HCTZ 25 mg daily.  Recent be met 04/10/2017 showed normal potassium at 4.2 and normal creatinine at 0.94.  4. OSA - the patient is tolerating PAP therapy well without any problems. The PAP download was reviewed today and showed an AHI of 0.8/hr on 9 cm H2O with 13% compliance in using more than 4 hours nightly.  The patient has been using and benefiting from CPAP but is having difficulty staying asleep with the CPAP.  He is also having problems with pushing his tongue into his lower teeth which has caused movement of his teeth during sleep.  I am going to refer him to Dr. Toy Cookey to get evaluated for possibly wearing a bite guard at night with his CPAP device.  His compliance is down because he is only sleepin 2-3 hours night due to waking up about 2-3 hours after falling asleep and not being able to get back to sleep.  I have recommended that he try Melatonin for sleep at night.  If his sleep maintenance does not improve we may need to try Ambien or Lunesta.   Medication Adjustments/Labs and Tests Ordered: Current medicines are reviewed at length with the patient today.  Concerns regarding medicines are outlined above.  No orders of the defined types were placed in this encounter.  No orders of the defined types were placed in this encounter.   Signed, Fransico Him, MD  04/28/2017 3:32 PM  [facility]

## 2017-04-28 NOTE — Patient Instructions (Signed)
Medication Instructions:   START TAKING OVER THE COUNTER MELATONIN 1 MG ONCE DAILY AT BEDTIME      You have been referred to DR. FULLER DENTIST DDS  WE WILL ARRANGE FOR YOUR CHIN STRAP FOR YOUR CPAP DEVICE      Follow-Up:  3 MONTHS WITH DR Radford Pax        If you need a refill on your cardiac medications before your next appointment, please call your pharmacy.

## 2017-05-05 ENCOUNTER — Telehealth: Payer: Self-pay | Admitting: *Deleted

## 2017-05-05 DIAGNOSIS — G4733 Obstructive sleep apnea (adult) (pediatric): Secondary | ICD-10-CM

## 2017-05-05 DIAGNOSIS — Z9989 Dependence on other enabling machines and devices: Secondary | ICD-10-CM

## 2017-05-05 NOTE — Telephone Encounter (Signed)
Per Dr Radford Pax referral sent to Dr Chestine Spore " I am going to refer him to Dr. Toy Cookey to get evaluated for possibly wearing a bite guard at night with his CPAP device. "   Per Dr Radford Pax chin strap ordered and faxed to [facility]

## 2017-05-06 ENCOUNTER — Telehealth: Payer: Self-pay | Admitting: Medical

## 2017-05-06 ENCOUNTER — Other Ambulatory Visit (INDEPENDENT_AMBULATORY_CARE_PROVIDER_SITE_OTHER): Payer: PRIVATE HEALTH INSURANCE

## 2017-05-06 DIAGNOSIS — B49 Unspecified mycosis: Secondary | ICD-10-CM

## 2017-05-06 NOTE — Telephone Encounter (Signed)
Patient wants to know if he can take active PK supplement.  It is not FDA approved and I tried to do a little research on it.  From what I understand its catabolic meaning that it has some properties of breaking down muscle.  With his medical conditions, I do not think I would take a chance with this.  Sorry to say this if he already spent the money for the product.

## 2017-05-06 NOTE — Telephone Encounter (Signed)
Caller name: Relation to MQ:TTCN Call back [phone] Pharmacy:  Reason for call: Pt would like to know from Percell Miller, is it ok that he takes Active-pk supplement. Please advise.

## 2017-05-06 NOTE — Telephone Encounter (Addendum)
Sueanne Margarita, MD  Freada Bergeron, CMA        A bite guard is not an oral device for CPAP. Please find out if patient wants to start over   [place]    Patient is very upset and he says No, he does not want to start over again not at this time. He feels like it has been a hassel to get the CPAP and to keep it.  Patient says he will call back if things change.

## 2017-05-06 NOTE — Telephone Encounter (Signed)
Patient has to return his CPAP for non compliance with only 11% usage per compliance department. CPAP assistant called the DME to explain your 11/6 note but they have already called for the return of the CPAP or start private paying.  [facility] says per medicare guidelines the patient must have another office visit, another sleep study( if he qualifies), and another order for another CPAP. [facility] aslo states if patient gets a bite guard medicare will deny him another CPAP because they do not allow patients to have both appliances.

## 2017-05-07 NOTE — Telephone Encounter (Signed)
Patient notified of PCP's advise. He said he will not take the medication since it is not recommended. RS CMA

## 2017-05-08 LAB — FUNGAL STAIN
FUNGAL SMEAR:: NONE SEEN
MICRO NUMBER:: 81285210
SPECIMEN QUALITY:: ADEQUATE

## 2017-05-11 ENCOUNTER — Telehealth: Payer: Self-pay | Admitting: Medical

## 2017-05-11 DIAGNOSIS — L609 Nail disorder, unspecified: Secondary | ICD-10-CM

## 2017-05-11 NOTE — Telephone Encounter (Signed)
Referral to dermatologist placed. 

## 2017-05-20 ENCOUNTER — Encounter: Payer: Self-pay | Admitting: Medical

## 2017-05-20 ENCOUNTER — Ambulatory Visit (INDEPENDENT_AMBULATORY_CARE_PROVIDER_SITE_OTHER): Payer: Medicare Other | Admitting: Medical

## 2017-05-20 VITALS — BP 160/88 | HR 72 | Temp 98.1°F | Resp 16 | Ht 69.0 in | Wt 204.2 lb

## 2017-05-20 DIAGNOSIS — K0510 Chronic gingivitis, plaque induced: Secondary | ICD-10-CM | POA: Diagnosis not present

## 2017-05-20 DIAGNOSIS — K0889 Other specified disorders of teeth and supporting structures: Secondary | ICD-10-CM

## 2017-05-20 MED ORDER — AMOXICILLIN-POT CLAVULANATE 875-125 MG PO TABS
1.0000 | ORAL_TABLET | Freq: Two times a day (BID) | ORAL | 0 refills | Status: DC
Start: 2017-05-20 — End: 2017-07-16

## 2017-05-20 NOTE — Patient Instructions (Signed)
For recent tooth pain and concern for possible infection, I am prescribing augmentin. Continue warm salt water gargles/rinses.  Try to follow up with dentist if symptoms persist before you go on vacation.  Follow up as regularly scheduled with me or as needed

## 2017-05-20 NOTE — Progress Notes (Signed)
Subjective:    Patient ID: Matthew Nixon, male    DOB: 1961/01/27, 56 y.o.   MRN: [id number]  HPI  Pt in states about once a year he will get inflamed gum around a tooth where he formerly had root canal. Pt points to rt upper tooth. No fever, no chills or sweats.   Pt states he tried to call dentist but he is out of town. Pt also leaving town early December he will be traveling to [place]  Review of Systems  Constitutional: Negative for chills, fatigue and fever.  HENT:       Gum pain and teeth pain.  Respiratory: Negative for cough, chest tightness, shortness of breath and wheezing.   Cardiovascular: Negative for chest pain and palpitations.  Gastrointestinal: Negative for abdominal pain.  Musculoskeletal: Negative for back pain.  Hematological: Negative for adenopathy. Does not bruise/bleed easily.  Psychiatric/Behavioral: Negative for behavioral problems and confusion.    Past Medical History:  Diagnosis Date  . Allergy   . Cardiomyopathy ([place])    DCM with EF 32% on echo 2014 and no ischemia on nuclear stress test.  EF improved to 40-45% by echo 02/2017  . Chronic back pain    low back  . Chronic systolic CHF (congestive heart failure), NYHA class 2 ([place]) 08/07/2016  . COPD (chronic obstructive pulmonary disease) ([place])   . Dyspnea    with exertion  . EKG, abnormal    history of left bundle block since 2002 per pt  . GERD (gastroesophageal reflux disease)   . Headache   . Hypertension   . LBBB (left bundle branch block)   . OSA on CPAP 08/07/2016  . Oxygen deficiency   . Pain    right side pain for many years  . PTSD (post-traumatic stress disorder)   . Sleep apnea      Social History   Socioeconomic History  . Marital status: Married    Spouse name: Not on file  . Number of children: Not on file  . Years of education: Not on file  . Highest education level: Not on file  Social Needs  . Financial resource strain: Not on file  . Food  insecurity - worry: Not on file  . Food insecurity - inability: Not on file  . Transportation needs - medical: Not on file  . Transportation needs - non-medical: Not on file  Occupational History  . Not on file  Tobacco Use  . Smoking status: Never Smoker  . Smokeless tobacco: Never Used  Substance and Sexual Activity  . Alcohol use: No  . Drug use: No  . Sexual activity: Not on file  Other Topics Concern  . Not on file  Social History Narrative   2 years college     Past Surgical History:  Procedure Laterality Date  . ANKLE SURGERY Right   . CARDIAC CATHETERIZATION  yrs ago  . ESOPHAGOGASTRODUODENOSCOPY (EGD) WITH PROPOFOL N/A 01/13/2017   Procedure: ESOPHAGOGASTRODUODENOSCOPY (EGD) WITH PROPOFOL;  Surgeon: Irene Shipper, MD;  Location: [facility] END[facility]COPY;  Service: Endoscopy;  Laterality: N/A;  . HAND SURGERY     Right  . HAND SURGERY     Left carpel tunnel  . THUMB ARTHROSCOPY Left     Family History  Problem Relation Age of Onset  . Heart disease Mother   . Heart disease Father   . Colon cancer Neg Hx   . Esophageal cancer Neg Hx   . Pancreatic cancer Neg Hx   .  Stomach cancer Neg Hx     No Known Allergies  Current Outpatient Medications on File Prior to Visit  Medication Sig Dispense Refill  . albuterol (PROVENTIL HFA;VENTOLIN HFA) 108 (90 Base) MCG/ACT inhaler Inhale 2 puffs into the lungs every 6 (six) hours as needed for wheezing or shortness of breath. 1 Inhaler 0  . amLODipine (NORVASC) 10 MG tablet Take 10 mg by mouth daily.    [name] aspirin 81 MG tablet Take 81 mg by mouth daily.    . carvedilol (COREG) 25 MG tablet Take 25 mg by mouth 2 (two) times daily with a meal.    . cetirizine (ZYRTEC) 10 MG tablet Take 10 mg by mouth daily.    . ciclopirox (PENLAC) 8 % solution Apply topically at bedtime. Apply over nail and surrounding skin. Apply daily over previous coat. After seven (7) days, may remove with alcohol and continue cycle. 6.6 mL 0  . cyclobenzaprine  (FLEXERIL) 10 MG tablet Take 1 tablet (10 mg total) by mouth at bedtime. 14 tablet 0  . digoxin (LANOXIN) 0.125 MG tablet Take 0.125 mg by mouth daily.     [name] doxazosin (CARDURA) 8 MG tablet Take 8 mg by mouth daily.    . hydrALAZINE (APRESOLINE) 100 MG tablet Take 0.5 tablets (50 mg total) by mouth 3 (three) times daily. 90 tablet 11  . hydrochlorothiazide (HYDRODIURIL) 25 MG tablet TAKE 1 TABLET BY MOUTH ONCE DAILY 90 tablet 2  . hydrOXYzine (ATARAX/VISTARIL) 10 MG tablet 1-2 tab po q 8 hours as needed itching. 30 tablet 0  . ipratropium (ATROVENT HFA) 17 MCG/ACT inhaler Inhale 2 puffs into the lungs every 6 (six) hours as needed for wheezing. 1 Inhaler 3  . irbesartan (AVAPRO) 300 MG tablet Take 1 tablet (300 mg total) by mouth daily. 30 tablet 11  . ISOSORBIDE MONONITRATE PO Take 60 mg by mouth 2 (two) times daily.    . Melatonin 1 MG CAPS Take 1 capsule (1 mg total) at bedtime by mouth.  0  . mometasone-formoterol (DULERA) 200-5 MCG/ACT AERO Inhale 2 puffs into the lungs 2 (two) times daily. 13 g 2  . omeprazole (PRILOSEC) 40 MG capsule Take 1 capsule (40 mg total) by mouth 2 (two) times daily. 180 capsule 3  . ondansetron (ZOFRAN ODT) 8 MG disintegrating tablet Take 1 tablet (8 mg total) by mouth every 8 (eight) hours as needed for nausea or vomiting. 20 tablet 0  . ranitidine (ZANTAC) 150 MG capsule Take 1 capsule (150 mg total) by mouth 2 (two) times daily. 60 capsule 0  . spironolactone (ALDACTONE) 25 MG tablet Take 25 mg by mouth daily.    [name] terbinafine (LAMISIL) 250 MG tablet Take 1 tablet (250 mg total) by mouth daily. 42 tablet 0  . traMADol (ULTRAM) 50 MG tablet 1-2 every 4 hours as needed for cough or pain 40 tablet 0  . triamcinolone cream (KENALOG) 0.5 % Apply 1 application topically 3 (three) times daily. 30 g 0   No current facility-administered medications on file prior to visit.     BP (!) 174/91 (BP Location: Left Arm, Patient Position: Sitting, Cuff Size: Large)   Pulse  72   Temp 98.1 F (36.7 C) (Oral)   Resp 16   Ht 5\' 9"  (1.753 m)   Wt 204 lb 3.2 oz (92.6 kg)   BMI 30.16 kg/m       Objective:   Physical Exam  General- No acute distress. Pleasant patient. Neck- Full range of  motion, no jvd Lungs- Clear, even and unlabored. Heart- regular rate and rhythm. Neurologic- CNII- XII grossly intact.  Mouth-rt upper canine  tooth mild tender to palpation. Gums inflamed. No obvious carie noted.        Assessment & Plan:  For recent tooth pain and concern for possible infection, I am prescribing augmentin. Continue warm salt water gargles/rinses.  Try to follow up with dentist if symptoms persist before you go on vacation.  Follow up as regularly scheduled with me or as needed  Emmamarie Kluender, Percell Miller, PA-C

## 2017-06-05 ENCOUNTER — Ambulatory Visit: Payer: PRIVATE HEALTH INSURANCE | Admitting: Internal Medicine

## 2017-06-09 ENCOUNTER — Ambulatory Visit: Payer: Medicare Other | Admitting: Internal Medicine

## 2017-07-16 ENCOUNTER — Encounter: Payer: Self-pay | Admitting: Internal Medicine

## 2017-07-16 ENCOUNTER — Ambulatory Visit (INDEPENDENT_AMBULATORY_CARE_PROVIDER_SITE_OTHER): Payer: Commercial Managed Care - PPO | Admitting: Internal Medicine

## 2017-07-16 VITALS — BP 140/90 | HR 78 | Ht 69.0 in | Wt 198.0 lb

## 2017-07-16 DIAGNOSIS — R05 Cough: Secondary | ICD-10-CM | POA: Diagnosis not present

## 2017-07-16 DIAGNOSIS — D804 Selective deficiency of immunoglobulin M [IgM]: Secondary | ICD-10-CM

## 2017-07-16 DIAGNOSIS — R0609 Other forms of dyspnea: Secondary | ICD-10-CM

## 2017-07-16 DIAGNOSIS — R0600 Dyspnea, unspecified: Secondary | ICD-10-CM

## 2017-07-16 DIAGNOSIS — R053 Chronic cough: Secondary | ICD-10-CM
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
## 2017-07-16 NOTE — Addendum Note (Signed)
Addended by: Lorretta Harp on: 07/16/2017 10:53 AM   Modules accepted: Orders

## 2017-07-30 ENCOUNTER — Ambulatory Visit (HOSPITAL_COMMUNITY): Payer: Commercial Managed Care - PPO | Attending: Internal Medicine

## 2017-07-30 DIAGNOSIS — R0600 Dyspnea, unspecified: Secondary | ICD-10-CM | POA: Diagnosis not present

## 2017-07-30 DIAGNOSIS — R0609 Other forms of dyspnea: Secondary | ICD-10-CM

## 2017-07-31 ENCOUNTER — Telehealth: Payer: Self-pay | Admitting: Internal Medicine

## 2017-07-31 MED ORDER — PREDNISONE 10 MG PO TABS: ORAL_TABLET | ORAL | 0 refills | Status: DC

## 2017-07-31 MED ORDER — DOXYCYCLINE HYCLATE 100 MG PO TABS: 100.0000 mg | ORAL_TABLET | Freq: Two times a day (BID) | ORAL | 0 refills | Status: DC

## 2017-07-31 NOTE — Telephone Encounter (Signed)
Pt c/o increased chest pain, chest tightness, prod cough with yellow mucus, fatigue since CPST yesterday.    Denies fever, sharp chest pains (describes pain as more of a heaviness in chest), sweats, sinus congestion.  Pt has been using albuterol to help with s/s, requesting additional recs.   Uses [facility] [address] in [place].  MR please advise on additional recs.  Thanks.

## 2017-07-31 NOTE — Telephone Encounter (Signed)
cpst resuls not available  Dx probably acute bronchitis  Plan Take doxycycline 100mg  po twice daily x 5 days; take after meals and avoid sunlight  Please take Take prednisone 40mg  once daily x 3 days, then 30mg  once daily x 3 days, then 20mg  once daily x 3 days, then prednisone 10mg  once daily  x 3 days and stop   If no better or chest pain worse go to ER  Dr. Brand Males, M.D., [facility].C.P Pulmonary and Critical Care Medicine Staff Physician, [place] Director - Interstitial Lung Disease  Program  Pulmonary [place] at [place], [place], [address]  Pager: [phone], If no answer or between  15:00h - 7:00h: call [phone] Telephone: [phone]

## 2017-07-31 NOTE — Telephone Encounter (Signed)
Rx's sent [facility]. Nothing further is needed.

## 2017-07-31 NOTE — Telephone Encounter (Signed)
Contact patient at [phone].

## 2017-08-05 ENCOUNTER — Ambulatory Visit: Payer: PRIVATE HEALTH INSURANCE | Admitting: Medical

## 2017-08-06 ENCOUNTER — Telehealth: Payer: Self-pay | Admitting: Medical

## 2017-08-06 ENCOUNTER — Ambulatory Visit (INDEPENDENT_AMBULATORY_CARE_PROVIDER_SITE_OTHER): Payer: Medicare Other | Admitting: Medical

## 2017-08-06 ENCOUNTER — Encounter: Payer: Self-pay | Admitting: Medical

## 2017-08-06 ENCOUNTER — Ambulatory Visit (HOSPITAL_BASED_OUTPATIENT_CLINIC_OR_DEPARTMENT_OTHER)
Admission: RE | Admit: 2017-08-06 | Discharge: 2017-08-06 | Disposition: A | Discharge status: Home / Self Care | Payer: Commercial Managed Care - PPO | Source: Ambulatory Visit | Attending: Medical | Admitting: Medical

## 2017-08-06 VITALS — BP 160/80 | HR 72 | Temp 97.9°F | Resp 16 | Ht 69.0 in | Wt 201.8 lb

## 2017-08-06 DIAGNOSIS — R079 Chest pain, unspecified: Secondary | ICD-10-CM | POA: Diagnosis not present

## 2017-08-06 DIAGNOSIS — R05 Cough: Secondary | ICD-10-CM

## 2017-08-06 DIAGNOSIS — R059 Cough, unspecified: Secondary | ICD-10-CM

## 2017-08-06 DIAGNOSIS — R0781 Pleurodynia: Secondary | ICD-10-CM | POA: Insufficient documentation

## 2017-08-06 DIAGNOSIS — I429 Cardiomyopathy, unspecified: Secondary | ICD-10-CM | POA: Diagnosis not present

## 2017-08-06 DIAGNOSIS — I10 Essential (primary) hypertension: Secondary | ICD-10-CM

## 2017-08-06 MED ORDER — BENZONATATE 100 MG PO CAPS
100.0000 mg | ORAL_CAPSULE | Freq: Three times a day (TID) | ORAL | 0 refills | Status: DC | PRN
Start: 2017-08-06 — End: 2017-10-21

## 2017-08-06 NOTE — Telephone Encounter (Signed)
Open to review.  

## 2017-08-06 NOTE — Telephone Encounter (Signed)
I need to talk with Matthew Nixon or Matthew Nixon about patient's recent complaint regarding the way he was called regarding his need for a AWV Medicare exam.  It would be difficult to explain on this note.  Patient wanted it addressed and he wants me to talk with management.  In summary he had some complaints regarding receptionist staff.  I do want to discuss this as he had considered calling Medicare but stated he wanted to talk to me first.  I wanted to give you guys a heads up//history behind his complaint before he calls or before you call him.

## 2017-08-06 NOTE — Progress Notes (Signed)
Subjective:    Patient ID: Matthew Nixon, male    DOB: [birth year], 57 y.o.   MRN: [id number]  HPI  Pt in with some PFT studies last week. The pulmonologist did not think he has copd. Pulmonologist told [place] he is not sure of 911 protocols related to exposure from chemical from [facility]. He states during stress test had some discomfort rt side lung discomfort. He points to lower rib area pain posterior lower rib areas on both ribs.  This pain was more prominent on deep breathing when he had the PFTs.  Since then the pain has been getting progressively less day.  Today the pain is minimal/low level.  Only present on deep inspiration.  Looks like pulmonologist rx'd doxycycline for 5 days. And he wrote for tapered prednisone taper.  Patient in the past has been very reluctant to take prednisone due to the fact that he has CHF.  In the past he had declined prednisone for other conditions.  Not reporting any cardiac or neurologic signs or symptoms.  Not reporting any weight gain or pedal edema.  No pain in legs reported.    Review of Systems  Constitutional: Negative for chills, diaphoresis, fatigue and fever.  HENT: Negative for congestion, facial swelling, mouth sores, nosebleeds, postnasal drip, rhinorrhea, sinus pressure and sneezing.   Respiratory: Positive for cough. Negative for chest tightness, shortness of breath and wheezing.        No cough recently but the day after he is pulmonary function test he stated he coughed a lot at night.  Cardiovascular: Negative for chest pain and palpitations.  Gastrointestinal: Negative for abdominal distention, anal bleeding, blood in stool, constipation and diarrhea.  Genitourinary: Negative for dysuria, flank pain, frequency, genital sores, hematuria, penile pain, scrotal swelling and urgency.  Musculoskeletal: Negative for back pain, myalgias and neck stiffness.  Skin: Negative for rash.  Neurological: Negative for dizziness, seizures, speech  difficulty, weakness, light-headedness, numbness and headaches.  Hematological: Negative for adenopathy. Does not bruise/bleed easily.  Psychiatric/Behavioral: Negative for behavioral problems, confusion, sleep disturbance and suicidal ideas. The patient is not nervous/anxious.     Past Medical History:  Diagnosis Date  . Allergy   . Cardiomyopathy ([place])    DCM with EF 32% on echo 2014 and no ischemia on nuclear stress test.  EF improved to 40-45% by echo 02/2017  . Chronic back pain    low back  . Chronic systolic CHF (congestive heart failure), NYHA class 2 ([place]) 08/07/2016  . COPD (chronic obstructive pulmonary disease) ([place])   . Dyspnea    with exertion  . EKG, abnormal    history of left bundle block since 2002 per pt  . GERD (gastroesophageal reflux disease)   . Headache   . Hypertension   . LBBB (left bundle branch block)   . OSA on CPAP 08/07/2016  . Oxygen deficiency   . Pain    right side pain for many years  . PTSD (post-traumatic stress disorder)   . Sleep apnea      Social History   Socioeconomic History  . Marital status: Married    Spouse name: Not on file  . Number of children: Not on file  . Years of education: Not on file  . Highest education level: Not on file  Social Needs  . Financial resource strain: Not on file  . Food insecurity - worry: Not on file  . Food insecurity - inability: Not on file  . Transportation needs -  medical: Not on file  . Transportation needs - non-medical: Not on file  Occupational History  . Not on file  Tobacco Use  . Smoking status: Never Smoker  . Smokeless tobacco: Never Used  Substance and Sexual Activity  . Alcohol use: No  . Drug use: No  . Sexual activity: Not on file  Other Topics Concern  . Not on file  Social History Narrative   2 years college     Past Surgical History:  Procedure Laterality Date  . ANKLE SURGERY Right   . CARDIAC CATHETERIZATION  yrs ago  . ESOPHAGOGASTRODUODENOSCOPY (EGD) WITH  PROPOFOL N/A 01/13/2017   Procedure: ESOPHAGOGASTRODUODENOSCOPY (EGD) WITH PROPOFOL;  Surgeon: Irene Shipper, MD;  Location: [facility];  Service: Endoscopy;  Laterality: N/A;  . HAND SURGERY     Right  . HAND SURGERY     Left carpel tunnel  . THUMB ARTHROSCOPY Left     Family History  Problem Relation Age of Onset  . Heart disease Mother   . Heart disease Father   . Colon cancer Neg Hx   . Esophageal cancer Neg Hx   . Pancreatic cancer Neg Hx   . Stomach cancer Neg Hx     No Known Allergies  Current Outpatient Medications on File Prior to Visit  Medication Sig Dispense Refill  . albuterol (PROVENTIL HFA;VENTOLIN HFA) 108 (90 Base) MCG/ACT inhaler Inhale 2 puffs into the lungs every 6 (six) hours as needed for wheezing or shortness of breath. 1 Inhaler 0  . amLODipine (NORVASC) 10 MG tablet Take 10 mg by mouth daily.    [name] aspirin 81 MG tablet Take 81 mg by mouth daily.    . carvedilol (COREG) 25 MG tablet Take 25 mg by mouth 2 (two) times daily with a meal.    . cetirizine (ZYRTEC) 10 MG tablet Take 10 mg by mouth daily.    . cyclobenzaprine (FLEXERIL) 10 MG tablet Take 1 tablet (10 mg total) by mouth at bedtime. 14 tablet 0  . digoxin (LANOXIN) 0.125 MG tablet Take 0.125 mg by mouth daily.     [name] doxazosin (CARDURA) 8 MG tablet Take 8 mg by mouth daily.    [name] doxycycline (VIBRA-TABS) 100 MG tablet Take 1 tablet (100 mg total) by mouth 2 (two) times daily. 10 tablet 0  . hydrALAZINE (APRESOLINE) 100 MG tablet Take 0.5 tablets (50 mg total) by mouth 3 (three) times daily. 90 tablet 11  . hydrochlorothiazide (HYDRODIURIL) 25 MG tablet TAKE 1 TABLET BY MOUTH ONCE DAILY 90 tablet 2  . hydrOXYzine (ATARAX/VISTARIL) 10 MG tablet 1-2 tab po q 8 hours as needed itching. 30 tablet 0  . ipratropium (ATROVENT HFA) 17 MCG/ACT inhaler Inhale 2 puffs into the lungs every 6 (six) hours as needed for wheezing. 1 Inhaler 3  . irbesartan (AVAPRO) 300 MG tablet Take 1 tablet (300 mg total) by mouth  daily. 30 tablet 11  . ISOSORBIDE MONONITRATE PO Take 60 mg by mouth 2 (two) times daily.    . mometasone-formoterol (DULERA) 200-5 MCG/ACT AERO Inhale 2 puffs into the lungs 2 (two) times daily. 13 g 2  . omeprazole (PRILOSEC) 40 MG capsule Take 1 capsule (40 mg total) by mouth 2 (two) times daily. 180 capsule 3  . ondansetron (ZOFRAN ODT) 8 MG disintegrating tablet Take 1 tablet (8 mg total) by mouth every 8 (eight) hours as needed for nausea or vomiting. 20 tablet 0  . predniSONE (DELTASONE) 10 MG tablet Take 4 tabs  by mouth for 3 days, then 3 for 3 days, 2 for 3 days, 1 for 3 days and stop 30 tablet 0  . ranitidine (ZANTAC) 150 MG capsule Take 1 capsule (150 mg total) by mouth 2 (two) times daily. 60 capsule 0  . spironolactone (ALDACTONE) 25 MG tablet Take 25 mg by mouth daily.    . traMADol (ULTRAM) 50 MG tablet 1-2 every 4 hours as needed for cough or pain 40 tablet 0   No current facility-administered medications on file prior to visit.     BP (!) 169/77 (BP Location: Left Arm, Patient Position: Sitting, Cuff Size: Large)   Pulse 72   Temp 97.9 F (36.6 C) (Oral)   Resp 16   Ht 5\' 9"  (1.753 m)   Wt 201 lb 12.8 oz (91.5 kg)   SpO2 96%   BMI 29.80 kg/m       Objective:   Physical Exam  General  Mental Status - Alert. General Appearance - Well groomed. Not in acute distress.  Skin Rashes- No Rashes.  HEENT Head- Normal. Ear Auditory Canal - Left- Normal. Right - Normal.Tympanic Membrane- Left- Normal. Right- Normal. Eye Sclera/Conjunctiva- Left- Normal. Right- Normal. Nose & Sinuses Nasal Mucosa- Left-  Boggy and Congested. Right-  Boggy and  Congested.Bilateral maxillary and frontal sinus pressure. Mouth & Throat Lips: Upper Lip- Normal: no dryness, cracking, pallor, cyanosis, or vesicular eruption. Lower Lip-Normal: no dryness, cracking, pallor, cyanosis or vesicular eruption. Buccal Mucosa- Bilateral- No Aphthous ulcers. Oropharynx- No Discharge or  Erythema. Tonsils: Characteristics- Bilateral- No Erythema or Congestion. Size/Enlargement- Bilateral- No enlargement. Discharge- bilateral-None.  Neck Neck- Supple. No Masses.   Chest and Lung Exam Auscultation: Breath Sounds:-Clear even and unlabored. Mild shallow breathing.  When he does breathe deeply he reports pain in his lower rib/lung feels posteriorly.  Pain is minimal presently and transient.  Last at peak of inspiration.  Cardiovascular Auscultation:Rythm- Regular, rate and rhythm. Murmurs & Other Heart Sounds:Ausculatation of the heart reveal- No Murmurs.  Lymphatic Head & Neck General Head & Neck Lymphatics: Bilateral: Description- No Localized lymphadenopathy.  Lower extremity-no pedal edema.  Negative Homans sign bilaterally       Assessment & Plan:  Pulmonologist recently wrote you for doxycycline sounds like he was treating you for bronchitis after the PFT test.  You can go ahead and get that filled.  He did write you a prolonged course of prednisone.  You have no recent wheezing and you have expressed concern in the past about potential effects of prednisone and your CHF.  So I do not recommend use prednisone presently.  If you do get wheezing then use your albuterol inhaler.  For cough you can use benzonatate.  For your described pleuritic pain in lower lobe regions, I want you to get chest x-ray today to rule out any pneumonia.  For that pain I want you to use Tylenol 1 tablet in the morning and another tablet at night.  Midday you can use very low dose ibuprofen 200 mg.  I want you to restrict higher dosages of NSAIDs due to the effect on increasing blood pressure.  Your pleuritic pain has already tapered off a lot and expect it would decrease further over the next week.  If it does not taper off completely in 1 week and then might consider just low dose of prednisone 10 mg 1 tab daily for 4 days.  Follow-up in 10-14 days or as needed.  Mackie Pai, PA-C

## 2017-08-06 NOTE — Patient Instructions (Signed)
Pulmonologist recently wrote you for doxycycline sounds like he was treating you for bronchitis after the PFT test.  You can go ahead and get that filled.  He did write you a prolonged course of prednisone.  You have no recent wheezing and you have expressed concern in the past about potential effects of prednisone and your CHF.  So I do not recommend use prednisone presently.  If you do get wheezing then use your albuterol inhaler.  For cough you can use benzonatate.  For your described pleuritic pain in lower lobe regions, I want you to get chest x-ray today to rule out any pneumonia.  For that pain I want you to use Tylenol 1 tablet in the morning and another tablet at night.  Midday you can use very low dose ibuprofen 200 mg.  I want you to restrict higher dosages of NSAIDs due to the effect on increasing blood pressure.  Your pleuritic pain has already tapered off a lot and expect it would decrease further over the next week.  If it does not taper off completely in 1 week and then might consider just low dose of prednisone 10 mg 1 tab daily for 4 days.  Follow-up in 10-14 days or as needed.

## 2017-08-09 DIAGNOSIS — R0609 Other forms of dyspnea: Secondary | ICD-10-CM | POA: Diagnosis not present

## 2017-08-09 NOTE — Progress Notes (Signed)
Cardiology Office Note:    Date:  08/10/2017   ID:  Matthew Nixon, DOB 10-10-1960, MRN [id number]  PCP:  Matthew Pai, PA-C  Cardiologist:  No primary care provider on file.    Referring MD: Matthew Pai, PA-C   Chief Complaint  Patient presents with  . Cardiomyopathy  . Congestive Heart Failure  . Hypertension  . Sleep Apnea    History of Present Illness:    Matthew Nixon is a 57 y.o. male with a hx of DCM with EF 32% (echo 2014) and repeat echo 92/108 with improved LVF with EF 40-45% and no ischemia on nuclear stress, chronic systolic heart failure NYHA class II, COPD, hypertension, left bundle branch block and obstructive sleep apnea.  His last PSG showed mild obstructive sleep apnea with an overall AHI of 7.6/h with severe sleep apnea during REM sleep with an AHI 27.7/h.  Oxygen desaturations dropped as low as 86%.  He is on  CPAP at 9cm H2O.    He is here today for followup and is doing well.  He denies any chest pain or pressure, SOB, DOE, PND, orthopnea, LE edema, dizziness, palpitations or syncope. He is compliant with his meds and is tolerating meds with no SE.  He had to hand his unit in due to noncompliance and was supposed to get one through a "911 program" but has not gotten it.  He says that he needs a Rx written.    Past Medical History:  Diagnosis Date  . Allergy   . Cardiomyopathy ([place])    DCM with EF 32% on echo 2014 and no ischemia on nuclear stress test.  EF improved to 40-45% by echo 02/2017  . Chronic back pain    low back  . Chronic systolic CHF (congestive heart failure), NYHA class 2 ([place]) 08/07/2016  . COPD (chronic obstructive pulmonary disease) ([place])   . Dyspnea    with exertion  . EKG, abnormal    history of left bundle block since 2002 per pt  . GERD (gastroesophageal reflux disease)   . Headache   . Hypertension   . LBBB (left bundle branch block)   . OSA on CPAP 08/07/2016  . Oxygen deficiency   . Pain    right side pain for many  years  . PTSD (post-traumatic stress disorder)   . Sleep apnea     Past Surgical History:  Procedure Laterality Date  . ANKLE SURGERY Right   . CARDIAC CATHETERIZATION  yrs ago  . ESOPHAGOGASTRODUODENOSCOPY (EGD) WITH PROPOFOL N/A 01/13/2017   Procedure: ESOPHAGOGASTRODUODENOSCOPY (EGD) WITH PROPOFOL;  Surgeon: Irene Shipper, MD;  [facility];  Service: Endoscopy;  Laterality: N/A;  . HAND SURGERY     Right  . HAND SURGERY     Left carpel tunnel  . THUMB ARTHROSCOPY Left     Current Medications: No outpatient medications have been marked as taking for the 08/10/17 encounter (Office Visit) with Sueanne Margarita, MD.     Allergies:   Patient has no known allergies.   Social History   Socioeconomic History  . Marital status: Married    Spouse name: None  . Number of children: None  . Years of education: None  . Highest education level: None  Social Needs  . Financial resource strain: None  . Food insecurity - worry: None  . Food insecurity - inability: None  . Transportation needs - medical: None  . Transportation needs - non-medical: None  Occupational History  . None  Tobacco Use  . Smoking status: Never Smoker  . Smokeless tobacco: Never Used  Substance and Sexual Activity  . Alcohol use: No  . Drug use: No  . Sexual activity: None  Other Topics Concern  . None  Social History Narrative   2 years college      Family History: The patient's family history includes Heart disease in his father and mother. There is no history of Colon cancer, Esophageal cancer, Pancreatic cancer, or Stomach cancer.  ROS:   Please see the history of present illness.    ROS  All other systems reviewed and negative.   EKGs/Labs/Other Studies Reviewed:    The following studies were reviewed today: CPAP download  EKG:  EKG is  ordered today showing NSR with LBBB  Recent Labs: 09/22/2016: Pro B Natriuretic peptide (BNP) 59.0 03/11/2017: Hemoglobin 16.5; Platelets  226.0 04/10/2017: ALT 32; BUN 16; Creat 0.94; Potassium 4.2; Sodium 143   Recent Lipid Panel    Component Value Date/Time   CHOL 159 03/18/2016 0708   TRIG 118.0 03/18/2016 0708   HDL 38.90 (L) 03/18/2016 0708   CHOLHDL 4 03/18/2016 0708   VLDL 23.6 03/18/2016 0708   LDLCALC 96 03/18/2016 0708    Physical Exam:    VS:  BP (!) 160/84   Pulse 71   Ht 5\' 9"  (1.753 m)   Wt 201 lb 6.4 oz (91.4 kg)   BMI 29.74 kg/m     Wt Readings from Last 3 Encounters:  08/10/17 201 lb 6.4 oz (91.4 kg)  08/06/17 201 lb 12.8 oz (91.5 kg)  07/16/17 198 lb (89.8 kg)     GEN:  Well nourished, well developed in no acute distress HEENT: Normal NECK: No JVD; No carotid bruits LYMPHATICS: No lymphadenopathy CARDIAC: RRR, no murmurs, rubs, gallops RESPIRATORY:  Clear to auscultation without rales, wheezing or rhonchi  ABDOMEN: Soft, non-tender, non-distended MUSCULOSKELETAL:  No edema; No deformity  SKIN: Warm and dry NEUROLOGIC:  Alert and oriented x 3 PSYCHIATRIC:  Normal affect   ASSESSMENT:    1. Dilated cardiomyopathy ([place])   2. Essential hypertension   3. Chronic systolic heart failure ([place])   4. OSA on CPAP    PLAN:    In order of problems listed above:  1.  DCM - prior nuclear stress test with no ischemia. EF 40-45% by echo 02/2017.    2.  HTN - BP is borderline controlled on exam today.  He will continue on amlodipine 10mg  daily, Carvedilol 25mg  BID, spironolactone 25mg  daily, irbesartan 300mg  daily, Hydralazine 50mg  TID, cardura 8mg  daily.  3.  Chronic systolic CHF -He appears euvolemic on exam today.  He will continue on spironolactone, HCTZ, BB, ARB, Hydralazine and nitrates.    4.  OSA - He had to hand his unit in due to noncompliance and was supposed to get one through a "911 program" but has not gotten it.  He says that he needs a Rx written.  I will place an order with DME for Airsense CPAP at 9cm H2O with heated humidity and mask of choice  Medication Adjustments/Labs  and Tests Ordered: Current medicines are reviewed at length with the patient today.  Concerns regarding medicines are outlined above.  Orders Placed This Encounter  Procedures  . EKG 12-Lead   No orders of the defined types were placed in this encounter.   Signed, Fransico Him, MD  08/10/2017 2:57 PM    [facility]

## 2017-08-10 ENCOUNTER — Encounter: Payer: Self-pay | Admitting: Cardiology

## 2017-08-10 ENCOUNTER — Ambulatory Visit (INDEPENDENT_AMBULATORY_CARE_PROVIDER_SITE_OTHER): Payer: Commercial Managed Care - PPO | Admitting: Cardiology

## 2017-08-10 ENCOUNTER — Telehealth: Payer: Self-pay | Admitting: Internal Medicine

## 2017-08-10 VITALS — BP 160/84 | HR 71 | Ht 69.0 in | Wt 201.4 lb

## 2017-08-10 DIAGNOSIS — Z9989 Dependence on other enabling machines and devices: Secondary | ICD-10-CM

## 2017-08-10 DIAGNOSIS — I10 Essential (primary) hypertension: Secondary | ICD-10-CM

## 2017-08-10 DIAGNOSIS — G4733 Obstructive sleep apnea (adult) (pediatric): Secondary | ICD-10-CM

## 2017-08-10 DIAGNOSIS — I420 Dilated cardiomyopathy: Secondary | ICD-10-CM

## 2017-08-10 DIAGNOSIS — I5022 Chronic systolic (congestive) heart failure: Secondary | ICD-10-CM

## 2017-08-10 NOTE — Telephone Encounter (Signed)
Called patient, nothing further needed.

## 2017-08-10 NOTE — Progress Notes (Signed)
LMTCB

## 2017-08-10 NOTE — Patient Instructions (Signed)
Medication Instructions:  Your physician recommends that you continue on your current medications as directed. Please refer to the Current Medication list given to you today.  Labwork: Today for kidney function and digoxin level   Testing/Procedures: None Ordered   Follow-Up: Your physician wants you to follow-up in 11 weeks with Dr. Radford Pax.   Any Other Special Instructions Will Be Listed Below (If Applicable).  CPAP orders have been placed. You will receive a call from the home health agency regarding setting up equipment. If you do not receive a call within the next week give Gae Bon, CPAP assistant a call at [phone].   Thank you for choosing [facility], RN  [phone]    If you need a refill on your cardiac medications before your next appointment, please call your pharmacy.

## 2017-08-11 LAB — BASIC METABOLIC PANEL
BUN/Creatinine Ratio: 15 (ref 9–20)
BUN: 15 mg/dL (ref 6–24)
CO2: 27 mmol/L (ref 20–29)
Calcium: 10.1 mg/dL (ref 8.7–10.2)
Chloride: 102 mmol/L (ref 96–106)
Creatinine, Ser: 1.01 mg/dL (ref 0.76–1.27)
GFR calc Af Amer: 95 mL/min/{1.73_m2} (ref >59)
GFR calc non Af Amer: 82 mL/min/{1.73_m2} (ref >59)
Glucose: 95 mg/dL (ref 65–99)
Potassium: 4.7 mmol/L (ref 3.5–5.2)
Sodium: 142 mmol/L (ref 134–144)

## 2017-08-11 LAB — DIGOXIN LEVEL: Digoxin, Serum: <0.4 ng/mL — ABNORMAL LOW (ref 0.5–0.9)

## 2017-08-11 NOTE — Progress Notes (Signed)
LMTCB on preferred phone number listed for patient. 

## 2017-08-12 ENCOUNTER — Telehealth: Payer: Self-pay | Admitting: Internal Medicine

## 2017-08-12 NOTE — Telephone Encounter (Signed)
Notes recorded by Brand Males, MD on 08/10/2017 at 9:39 AM EST wil ldiscuss at followup visit. He has irrritable throat as evidenced by variable flow volume loop  lmtcb x1 for pt

## 2017-08-13 NOTE — Telephone Encounter (Signed)
lmtcb X2 for pt to relay stress test results.

## 2017-08-14 NOTE — Telephone Encounter (Signed)
Attempted to call pt but no answer. Left message stating MR will discuss tests with him at next follow up visit.  Due to this being the third attempt to try to contact pt, will close this open encounter per Triage policy.

## 2017-08-18 ENCOUNTER — Telehealth: Payer: Self-pay | Admitting: *Deleted

## 2017-08-18 DIAGNOSIS — L601 Onycholysis: Secondary | ICD-10-CM | POA: Diagnosis not present

## 2017-08-18 NOTE — Telephone Encounter (Signed)
Airsense CPAP at 9 cm H2O with heated humidity and mask of choice ordered today through [place]

## 2017-08-18 NOTE — Telephone Encounter (Signed)
-----   Message from Teressa Senter, RN sent at 08/10/2017  3:19 PM EST ----- Regarding: DME order DME order placed   Thanks  Rena

## 2017-08-27 ENCOUNTER — Ambulatory Visit: Payer: PRIVATE HEALTH INSURANCE | Admitting: Internal Medicine

## 2017-08-28 ENCOUNTER — Telehealth: Payer: Self-pay | Admitting: Internal Medicine
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
## 2017-08-31 NOTE — Telephone Encounter (Signed)
Will need to discuss face to face. Can come on Friday 09/11/17 PM - I just opened up that afternoon; Ask Raquel Sarna Pinion  Thanks  Dr. Brand Males, M.D., [facility].C.P Pulmonary and Critical Care Medicine Staff Physician, [place] Director - Interstitial Lung Disease  Program  [facility] [place], [place], [address]  Pager: [phone], If no answer or between  15:00h - 7:00h: call [phone] Telephone: [phone]

## 2017-08-31 NOTE — Telephone Encounter (Signed)
Called patient, unable to reach left message to give [place] a call back. Routing this to [place].

## 2017-09-02 NOTE — Telephone Encounter (Signed)
Spoke with pt. He has been scheduled with Dr. Chase Caller on 09/04/17 at 10:15am. Nothing further was needed.

## 2017-09-04 ENCOUNTER — Ambulatory Visit (INDEPENDENT_AMBULATORY_CARE_PROVIDER_SITE_OTHER): Payer: Commercial Managed Care - PPO | Admitting: Internal Medicine

## 2017-09-04 ENCOUNTER — Encounter: Payer: Self-pay | Admitting: Internal Medicine

## 2017-09-04 VITALS — BP 132/80 | HR 77 | Ht 69.0 in | Wt 197.0 lb

## 2017-09-04 DIAGNOSIS — R05 Cough: Secondary | ICD-10-CM

## 2017-09-04 DIAGNOSIS — J387 Other diseases of larynx: Secondary | ICD-10-CM | POA: Diagnosis not present

## 2017-09-04 DIAGNOSIS — R053 Chronic cough: Secondary | ICD-10-CM
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
## 2017-09-10 NOTE — Telephone Encounter (Signed)
Called [employer] to follow up on patients cpap, Spoke to [place] who needed to verify patents insurance coverage to process his new cpap order. Huey Romans had not been able to verify patients insurance because they were using [facility] and Medicare. Reached out to [place] again spoke to [employer] and updated patients insurance to [place]. A cpoy of patients insurance card has been faxed over to [place] and Wille Glaser has been notified to look for the fax. Joe will forward the information to the correct team for processing. Anne Ng is the contact person at the [facility]  [phone] ext. [phone] have also faxed her the patients sleep studies, office notes, and cpap order. Anne Ng will contact me back if she does not receive the fax.

## 2017-09-28 DIAGNOSIS — I5022 Chronic systolic (congestive) heart failure: Secondary | ICD-10-CM | POA: Diagnosis not present

## 2017-09-28 DIAGNOSIS — I340 Nonrheumatic mitral (valve) insufficiency: Secondary | ICD-10-CM | POA: Diagnosis not present

## 2017-09-28 DIAGNOSIS — I10 Essential (primary) hypertension: Secondary | ICD-10-CM | POA: Diagnosis not present
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
## 2017-10-09 NOTE — Telephone Encounter (Signed)
Patient is approved through the [facility]) [employer] [facility] program as of 10/09/17. Matthew Nixon [facility]) has notified the patient.

## 2017-10-21 ENCOUNTER — Ambulatory Visit (INDEPENDENT_AMBULATORY_CARE_PROVIDER_SITE_OTHER): Payer: Medicare Other | Admitting: Medical

## 2017-10-21 ENCOUNTER — Encounter: Payer: Self-pay | Admitting: Medical

## 2017-10-21 VITALS — BP 135/51 | HR 66 | Temp 98.1°F | Resp 16 | Ht 69.0 in | Wt 199.0 lb

## 2017-10-21 DIAGNOSIS — R062 Wheezing: Secondary | ICD-10-CM | POA: Diagnosis not present

## 2017-10-21 DIAGNOSIS — L739 Follicular disorder, unspecified: Secondary | ICD-10-CM

## 2017-10-21 DIAGNOSIS — H00015 Hordeolum externum left lower eyelid: Secondary | ICD-10-CM

## 2017-10-21 DIAGNOSIS — J301 Allergic rhinitis due to pollen: Secondary | ICD-10-CM

## 2017-10-21 MED ORDER — ALBUTEROL SULFATE HFA 108 (90 BASE) MCG/ACT IN AERS: 2.0000 | INHALATION_SPRAY | Freq: Four times a day (QID) | RESPIRATORY_TRACT | 0 refills | Status: DC | PRN

## 2017-10-21 MED ORDER — MOMETASONE FURO-FORMOTEROL FUM 200-5 MCG/ACT IN AERO: 2.0000 | INHALATION_SPRAY | Freq: Two times a day (BID) | RESPIRATORY_TRACT | 2 refills | Status: DC

## 2017-10-21 MED ORDER — DOXYCYCLINE HYCLATE 100 MG PO TABS
100.0000 mg | ORAL_TABLET | Freq: Two times a day (BID) | ORAL | 0 refills | Status: DC
Start: 2017-10-21 — End: 2018-03-30

## 2017-10-21 MED ORDER — TOBRAMYCIN 0.3 % OP SOLN: 2.0000 [drp] | Freq: Four times a day (QID) | OPHTHALMIC | 0 refills | Status: DC

## 2017-10-21 MED ORDER — MONTELUKAST SODIUM 10 MG PO TABS: 10.0000 mg | ORAL_TABLET | Freq: Every day | ORAL | 3 refills | Status: DC

## 2017-10-21 MED ORDER — FLUTICASONE PROPIONATE 50 MCG/ACT NA SUSP: 2.0000 | Freq: Every day | NASAL | 1 refills | Status: DC

## 2017-10-21 NOTE — Progress Notes (Signed)
Subjective:    Patient ID: Matthew Nixon, male    DOB: 02/04/1961, 57 y.o.   MRN: [id number]  HPI   Pt in for some allergy type signs and symptoms. Symptoms for about 2 weeks.  Left lower eye lid swelling for about 2 weeks. Comes and goes. Not resolving.  Rt side scalp small bumps present for about 3 weeks.  Review of Systems  Constitutional: Negative for activity change, chills, fatigue and fever.  HENT: Positive for congestion, postnasal drip and sneezing. Negative for ear pain, hearing loss, nosebleeds and rhinorrhea.   Eyes:       Left eye lower lid swelling.   Respiratory: Positive for cough and wheezing. Negative for chest tightness and shortness of breath.        Pt ran out of zyrtec.  Faint transient chest discomfort when he coughs only.  Cardiovascular: Negative for chest pain and palpitations.  Gastrointestinal: Negative for abdominal pain.  Musculoskeletal: Negative for back pain, joint swelling and neck pain.  Skin: Negative for rash.       On rt side of pt scalp. Some faint scalp tenderness.   Neurological: Negative for dizziness, syncope, weakness and light-headedness.  Hematological: Negative for adenopathy. Does not bruise/bleed easily.  Psychiatric/Behavioral: Negative for behavioral problems and confusion. The patient is not nervous/anxious.    Past Medical History:  Diagnosis Date  . Allergy   . Cardiomyopathy ([place])    DCM with EF 32% on echo 2014 and no ischemia on nuclear stress test.  EF improved to 40-45% by echo 02/2017  . Chronic back pain    low back  . Chronic systolic CHF (congestive heart failure), NYHA class 2 ([place]) 08/07/2016  . COPD (chronic obstructive pulmonary disease) ([place])   . Dyspnea    with exertion  . EKG, abnormal    history of left bundle block since 2002 per pt  . GERD (gastroesophageal reflux disease)   . Headache   . Hypertension   . LBBB (left bundle branch block)   . OSA on CPAP 08/07/2016  . Oxygen deficiency   . Pain      right side pain for many years  . PTSD (post-traumatic stress disorder)   . Sleep apnea      Social History   Socioeconomic History  . Marital status: Married    Spouse name: Not on file  . Number of children: Not on file  . Years of education: Not on file  . Highest education level: Not on file  Occupational History  . Not on file  Social Needs  . Financial resource strain: Not on file  . Food insecurity:    Worry: Not on file    Inability: Not on file  . Transportation needs:    Medical: Not on file    Non-medical: Not on file  Tobacco Use  . Smoking status: Never Smoker  . Smokeless tobacco: Never Used  Substance and Sexual Activity  . Alcohol use: No  . Drug use: No  . Sexual activity: Not on file  Lifestyle  . Physical activity:    Days per week: Not on file    Minutes per session: Not on file  . Stress: Not on file  Relationships  . Social connections:    Talks on phone: Not on file    Gets together: Not on file    Attends religious service: Not on file    Active member of club or organization: Not on file  Attends meetings of clubs or organizations: Not on file    Relationship status: Not on file  . Intimate partner violence:    Fear of current or ex partner: Not on file    Emotionally abused: Not on file    Physically abused: Not on file    Forced sexual activity: Not on file  Other Topics Concern  . Not on file  Social History Narrative   2 years college     Past Surgical History:  Procedure Laterality Date  . ANKLE SURGERY Right   . CARDIAC CATHETERIZATION  yrs ago  . ESOPHAGOGASTRODUODENOSCOPY (EGD) WITH PROPOFOL N/A 01/13/2017   Procedure: ESOPHAGOGASTRODUODENOSCOPY (EGD) WITH PROPOFOL;  Surgeon: Irene Shipper, MD;  Location: [facility];  Service: Endoscopy;  Laterality: N/A;  . HAND SURGERY     Right  . HAND SURGERY     Left carpel tunnel  . THUMB ARTHROSCOPY Left     Family History  Problem Relation Age of Onset  . Heart disease  Mother   . Heart disease Father   . Colon cancer Neg Hx   . Esophageal cancer Neg Hx   . Pancreatic cancer Neg Hx   . Stomach cancer Neg Hx     No Known Allergies  Current Outpatient Medications on File Prior to Visit  Medication Sig Dispense Refill  . albuterol (PROVENTIL HFA;VENTOLIN HFA) 108 (90 Base) MCG/ACT inhaler Inhale 2 puffs into the lungs every 6 (six) hours as needed for wheezing or shortness of breath. 1 Inhaler 0  . amLODipine (NORVASC) 10 MG tablet Take 10 mg by mouth daily.    [name] aspirin 81 MG tablet Take 81 mg by mouth daily.    . carvedilol (COREG) 25 MG tablet Take 25 mg by mouth 2 (two) times daily with a meal.    . cetirizine (ZYRTEC) 10 MG tablet Take 10 mg by mouth daily.    . cyclobenzaprine (FLEXERIL) 10 MG tablet Take 1 tablet (10 mg total) by mouth at bedtime. 14 tablet 0  . digoxin (LANOXIN) 0.125 MG tablet Take 0.125 mg by mouth daily.     [name] doxazosin (CARDURA) 8 MG tablet Take 8 mg by mouth daily.    . hydrALAZINE (APRESOLINE) 100 MG tablet Take 0.5 tablets (50 mg total) by mouth 3 (three) times daily. 90 tablet 11  . hydrochlorothiazide (HYDRODIURIL) 25 MG tablet TAKE 1 TABLET BY MOUTH ONCE DAILY 90 tablet 2  . hydrOXYzine (ATARAX/VISTARIL) 10 MG tablet 1-2 tab po q 8 hours as needed itching. 30 tablet 0  . ipratropium (ATROVENT HFA) 17 MCG/ACT inhaler Inhale 2 puffs into the lungs every 6 (six) hours as needed for wheezing. 1 Inhaler 3  . irbesartan (AVAPRO) 300 MG tablet Take 1 tablet (300 mg total) by mouth daily. 30 tablet 11  . ISOSORBIDE MONONITRATE PO Take 60 mg by mouth 2 (two) times daily.    . mometasone-formoterol (DULERA) 200-5 MCG/ACT AERO Inhale 2 puffs into the lungs 2 (two) times daily. 13 g 2  . omeprazole (PRILOSEC) 40 MG capsule Take 1 capsule (40 mg total) by mouth 2 (two) times daily. 180 capsule 3  . ondansetron (ZOFRAN ODT) 8 MG disintegrating tablet Take 1 tablet (8 mg total) by mouth every 8 (eight) hours as needed for nausea or  vomiting. 20 tablet 0  . ranitidine (ZANTAC) 150 MG capsule Take 1 capsule (150 mg total) by mouth 2 (two) times daily. 60 capsule 0  . spironolactone (ALDACTONE) 25 MG tablet Take 25  mg by mouth daily.    . traMADol (ULTRAM) 50 MG tablet 1-2 every 4 hours as needed for cough or pain 40 tablet 0   No current facility-administered medications on file prior to visit.     BP (!) 135/51   Pulse 66   Temp 98.1 F (36.7 C) (Oral)   Resp 16   Ht 5\' 9"  (1.753 m)   Wt 199 lb (90.3 kg)   SpO2 99%   BMI 29.39 kg/m      Objective:   Physical Exam  General  Mental Status - Alert. General Appearance - Well groomed. Not in acute distress.  Skin Rashes- No Rashes.  HEENT Head- Normal. Ear Auditory Canal - Left- Normal. Right - Normal.Tympanic Membrane- Left- Normal. Right- Normal. Eye Sclera/Conjunctiva- Left- Normal. Right- Normal.(left lower eye lid small stye) Nose & Sinuses Nasal Mucosa- Left-  Boggy and Congested. Right-  Boggy and  Congested.Bilateral  No maxillary and no frontal sinus pressure. Mouth & Throat Lips: Upper Lip- Normal: no dryness, cracking, pallor, cyanosis, or vesicular eruption. Lower Lip-Normal: no dryness, cracking, pallor, cyanosis or vesicular eruption. Buccal Mucosa- Bilateral- No Aphthous ulcers. Oropharynx- No Discharge or Erythema. Tonsils: Characteristics- Bilateral- No Erythema or Congestion. Size/Enlargement- Bilateral- No enlargement. Discharge- bilateral-None.  Neck Neck- Supple. No Masses.   Chest and Lung Exam Auscultation: Breath Sounds:-Clear even and unlabored.  Cardiovascular Auscultation:Rythm- Regular, rate and rhythm. Murmurs & Other Heart Sounds:Ausculatation of the heart reveal- No Murmurs.  Lymphatic Head & Neck General Head & Neck Lymphatics: Bilateral: Description- No Localized lymphadenopathy.   Skin- rt side of scalp small inflamed follicles.Mild tender.   Lower ext- no pedal edema. Calves symmetric    Assessment &  Plan:  You do have recent allergic rhinitis signs and symptoms.  I want you to continue your Zyrtec and I did prescribe Flonase nasal spray.  With very heavy pollen season this year, I do think he would benefit from use of montelukast.  Prescription sent to your pharmacy.  For left eye stye, I did prescribe Tobrex solution.  Since you report recurrent styes very frequently I also prescribe doxycycline oral antibiotic.  If by 2 weeks the area has not resolved completely notify me and I will try to refer you to ophthalmologist.  You do have some mild inflamed follicles right side of scalp.  Doxycycline antibiotic has very good skin coverage and should clear up the folliculitis.  Rx advisement given.  For history of wheezing, I did refill your Dulera and albuterol.  Follow-up in 10 days or as needed.  Mackie Pai, PA-C

## 2017-10-21 NOTE — Patient Instructions (Signed)
You do have recent allergic rhinitis signs and symptoms.  I want you to continue your Zyrtec and I did prescribe Flonase nasal spray.  With very heavy pollen season this year, I do think he would benefit from use of montelukast.  Prescription sent to your pharmacy.  For left eye stye, I did prescribe Tobrex solution.  Since you report recurrent styes very frequently I also prescribe doxycycline oral antibiotic.  If by 2 weeks the area has not resolved completely notify me and I will try to refer you to ophthalmologist.  You do have some mild inflamed follicles right side of scalp.  Doxycycline antibiotic has very good skin coverage and should clear up the folliculitis.  Rx advisement given.  For history of wheezing, I did refill your Dulera and albuterol.  Follow-up in 10 days or as needed.

## 2017-10-22 ENCOUNTER — Telehealth: Payer: Self-pay | Admitting: Medical

## 2017-10-22 ENCOUNTER — Telehealth: Payer: Self-pay

## 2017-10-22 MED ORDER — BUDESONIDE-FORMOTEROL FUMARATE 80-4.5 MCG/ACT IN AERO: 2.0000 | INHALATION_SPRAY | Freq: Two times a day (BID) | RESPIRATORY_TRACT | 3 refills | Status: DC

## 2017-10-22 NOTE — Telephone Encounter (Signed)
Insurance doesn't cover [place], preferred alternative is Symbicort.

## 2017-10-22 NOTE — Telephone Encounter (Signed)
Patient has a 10 week follow up appointment scheduled for 7/12 2019. Patient understands he needs to keep this appointment for insurance compliance. Patient was grateful for the call and thanked me.

## 2017-10-22 NOTE — Telephone Encounter (Signed)
Symbicort sent to pt pharmacy.

## 2017-10-22 NOTE — Telephone Encounter (Signed)
Symbicort sent to pharmacy since [place] not covered.

## 2017-11-06 ENCOUNTER — Ambulatory Visit: Payer: Medicare Other | Admitting: Cardiology

## 2017-11-20 ENCOUNTER — Emergency Department (HOSPITAL_BASED_OUTPATIENT_CLINIC_OR_DEPARTMENT_OTHER): Payer: Medicare Other

## 2017-11-20 ENCOUNTER — Emergency Department (HOSPITAL_BASED_OUTPATIENT_CLINIC_OR_DEPARTMENT_OTHER)
Admission: EM | Admit: 2017-11-20 | Discharge: 2017-11-20 | Disposition: A | Discharge status: Home / Self Care | Payer: Medicare Other | Attending: Emergency Medicine | Admitting: Emergency Medicine

## 2017-11-20 ENCOUNTER — Other Ambulatory Visit: Payer: Self-pay

## 2017-11-20 ENCOUNTER — Encounter (HOSPITAL_BASED_OUTPATIENT_CLINIC_OR_DEPARTMENT_OTHER): Payer: Self-pay | Admitting: *Deleted

## 2017-11-20 DIAGNOSIS — R079 Chest pain, unspecified: Secondary | ICD-10-CM | POA: Diagnosis not present

## 2017-11-20 DIAGNOSIS — J449 Chronic obstructive pulmonary disease, unspecified: Secondary | ICD-10-CM | POA: Diagnosis not present

## 2017-11-20 DIAGNOSIS — I110 Hypertensive heart disease with heart failure: Secondary | ICD-10-CM | POA: Diagnosis not present

## 2017-11-20 DIAGNOSIS — I5022 Chronic systolic (congestive) heart failure: Secondary | ICD-10-CM | POA: Insufficient documentation

## 2017-11-20 DIAGNOSIS — R1010 Upper abdominal pain, unspecified: Secondary | ICD-10-CM | POA: Insufficient documentation

## 2017-11-20 DIAGNOSIS — R1013 Epigastric pain: Secondary | ICD-10-CM | POA: Diagnosis not present

## 2017-11-20 DIAGNOSIS — R072 Precordial pain: Secondary | ICD-10-CM | POA: Diagnosis not present

## 2017-11-20 LAB — HEPATIC FUNCTION PANEL
ALT: 25 U/L (ref 17–63)
AST: 23 U/L (ref 15–41)
Albumin: 4.8 g/dL (ref 3.5–5.0)
Alkaline Phosphatase: 60 U/L (ref 38–126)
Bilirubin, Direct: 0.1 mg/dL (ref 0.1–0.5)
Indirect Bilirubin: 0.7 mg/dL (ref 0.3–0.9)
Total Bilirubin: 0.8 mg/dL (ref 0.3–1.2)
Total Protein: 7.8 g/dL (ref 6.5–8.1)

## 2017-11-20 LAB — LIPASE, BLOOD: Lipase: 36 U/L (ref 11–51)

## 2017-11-20 LAB — CBC
HCT: 47 % (ref 39.0–52.0)
Hemoglobin: 16.9 g/dL (ref 13.0–17.0)
MCH: 32.6 pg (ref 26.0–34.0)
MCHC: 36 g/dL (ref 30.0–36.0)
MCV: 90.6 fL (ref 78.0–100.0)
Platelets: 240 10*3/uL (ref 150–400)
RBC: 5.19 MIL/uL (ref 4.22–5.81)
RDW: 12.5 % (ref 11.5–15.5)
WBC: 7.5 10*3/uL (ref 4.0–10.5)

## 2017-11-20 LAB — BASIC METABOLIC PANEL
Anion gap: 10 (ref 5–15)
BUN: 15 mg/dL (ref 6–20)
CO2: 23 mmol/L (ref 22–32)
Calcium: 9.4 mg/dL (ref 8.9–10.3)
Chloride: 102 mmol/L (ref 101–111)
Creatinine, Ser: 1.1 mg/dL (ref 0.61–1.24)
GFR calc Af Amer: >60 mL/min (ref >60)
GFR calc non Af Amer: >60 mL/min (ref >60)
Glucose, Bld: 171 mg/dL — ABNORMAL HIGH (ref 65–99)
Potassium: 3.8 mmol/L (ref 3.5–5.1)
Sodium: 135 mmol/L (ref 135–145)

## 2017-11-20 LAB — TROPONIN I
Troponin I: 0.03 ng/mL (ref <0.03)
Troponin I: <0.03 ng/mL (ref <0.03)

## 2017-11-20 MED ORDER — NITROGLYCERIN 2 % TD OINT
1.0000 [in_us] | TOPICAL_OINTMENT | Freq: Once | TRANSDERMAL | Status: AC
  Administered 2017-11-20: 1 [in_us] via TOPICAL
  Filled 2017-11-20: qty 1

## 2017-11-20 NOTE — ED Notes (Signed)
Pt discharged to home NAD.  

## 2017-11-20 NOTE — ED Notes (Signed)
Pt transported to xr

## 2017-11-20 NOTE — Discharge Instructions (Signed)
You were seen in the ED today with chest pain. I am concerned that this could be related to a heart attack and wanted to admit you for further testing. You need to call 911 immediately if you have any additional chest pain, abdominal pain, or other concerning symptoms. Call your Cardiologist first thing on Monday.

## 2017-11-20 NOTE — ED Notes (Signed)
Pt on auto VS  

## 2017-11-20 NOTE — ED Provider Notes (Signed)
Emergency Department Provider Note   I have reviewed the triage vital signs and the nursing notes.   HISTORY  Chief Complaint Chest Pain   HPI Matthew Nixon is a 57 y.o. male with PMH of systolic CHF, COPD, HTN, LBBB, HTN resents to the emergency department for evaluation of mid abdomen and substernal chest pain/pressure.  His symptoms worsened this morning and he notes some associated lightheadedness.  He [place] to the airport where he began having worsening pain in his chest primarily.  Patient states that his abdominal pain is more chronic in nature and has been evaluated in the past with only reflux being found during recent endoscopy.  He states that he feels very fatigued and generally weak that the worsening substernal chest pain is what prompted his emergency department visit ultimately.  He denies any diaphoresis.  He has had some nausea but no vomiting.  He took a full dose aspirin this morning.  No other modifying factors or radiation of symptoms.   Past Medical History:  Diagnosis Date  . Allergy   . Cardiomyopathy ([place])    DCM with EF 32% on echo 2014 and no ischemia on nuclear stress test.  EF improved to 40-45% by echo 02/2017  . Chronic back pain    low back  . Chronic systolic CHF (congestive heart failure), NYHA class 2 ([place]) 08/07/2016  . COPD (chronic obstructive pulmonary disease) ([place])   . Dyspnea    with exertion  . EKG, abnormal    history of left bundle block since 2002 per pt  . GERD (gastroesophageal reflux disease)   . Headache   . Hypertension   . LBBB (left bundle branch block)   . OSA on CPAP 08/07/2016  . Oxygen deficiency   . Pain    right side pain for many years  . PTSD (post-traumatic stress disorder)   . Sleep apnea     Patient Active Problem List   Diagnosis Date Noted  . Chronic cough 03/11/2017  . Dyspnea on exertion 03/11/2017  . Personal history of bronchiectasis 03/11/2017  . Abdominal pain, RUQ   . Gastroesophageal  reflux disease   . Upper airway cough syndrome 11/28/2016  . Bronchiectasis without complication ([place]) 77/41/2878  . Chronic systolic heart failure ([place]) 08/07/2016  . OSA on CPAP 08/07/2016  . LBBB (left bundle branch block)   . COPD, mild ([place]) 07/28/2014  . Cardiomyopathy ([place]) 07/28/2014  . Essential hypertension 07/28/2014  . Hemorrhoid 07/28/2014  . Polycythemia 07/28/2014  . Allergic rhinitis 07/28/2014    Past Surgical History:  Procedure Laterality Date  . ANKLE SURGERY Right   . CARDIAC CATHETERIZATION  yrs ago  . ESOPHAGOGASTRODUODENOSCOPY (EGD) WITH PROPOFOL N/A 01/13/2017   Procedure: ESOPHAGOGASTRODUODENOSCOPY (EGD) WITH PROPOFOL;  Surgeon: Irene Shipper, MD;  Location: [facility];  Service: Endoscopy;  Laterality: N/A;  . HAND SURGERY     Right  . HAND SURGERY     Left carpel tunnel  . THUMB ARTHROSCOPY Left     Current Outpatient Rx  . Order #: [id number] Class: Normal  . Order #: [id number] Class: Historical Med  . Order #: [id number] Class: Historical Med  . Order #: [id number] Class: Normal  . Order #: [id number] Class: Historical Med  . Order #: [id number] Class: Historical Med  . Order #: [id number] Class: Normal  . Order #: [id number] Class: Historical Med  . Order #: [id number] Class: Historical Med  . Order #: [id number] Class: Normal  . Order #: [id number] Class: Normal  . Order #: [id number] Class: Normal  .  Order #: [id number] Class: Normal  . Order #: [id number] Class: Normal  . Order #: [id number] Class: Print  . Order #: [id number] Class: Normal  . Order #: [id number] Class: Historical Med  . Order #: [id number] Class: Normal  . Order #: [id number] Class: Normal  . Order #: [id number] Class: Normal  . Order #: [id number] Class: Normal  . Order #: [id number] Class: Historical Med  . Order #: [id number] Class: Normal  . Order #: [id number] Class: Print    Allergies Patient has no known allergies.  Family History  Problem Relation Age of Onset  . Heart disease Mother   . Heart  disease Father   . Colon cancer Neg Hx   . Esophageal cancer Neg Hx   . Pancreatic cancer Neg Hx   . Stomach cancer Neg Hx     Social History Social History   Tobacco Use  . Smoking status: Never Smoker  . Smokeless tobacco: Never Used  Substance Use Topics  . Alcohol use: No  . Drug use: No    Review of Systems  Constitutional: No fever/chills Eyes: No visual changes. ENT: No sore throat. Cardiovascular: Positive chest pain. Respiratory: Denies shortness of breath. Gastrointestinal: Positive epigastric and right sided abdominal pain.  No nausea, no vomiting.  No diarrhea.  No constipation. Genitourinary: Negative for dysuria. Musculoskeletal: Negative for back pain. Skin: Negative for rash. Neurological: Negative for headaches, focal weakness or numbness.  10-point ROS otherwise negative.  ____________________________________________   PHYSICAL EXAM:  VITAL SIGNS: ED Triage Vitals  Enc Vitals Group     BP 11/20/17 1659 (!) 166/84     Pulse Rate 11/20/17 1659 84     Resp 11/20/17 1659 (!) 24     Temp 11/20/17 1659 98.3 F (36.8 C)     Temp Source 11/20/17 1659 Oral     SpO2 11/20/17 1659 100 %     Weight 11/20/17 1656 191 lb (86.6 kg)     Height 11/20/17 1656 5\' 9"  (1.753 m)     Pain Score 11/20/17 1656 6   Constitutional: Alert and oriented. Well appearing and in no acute distress. Eyes: Conjunctivae are normal. Head: Atraumatic. Nose: No congestion/rhinnorhea. Mouth/Throat: Mucous membranes are moist.  Neck: No stridor.  Cardiovascular: Normal rate, regular rhythm. Good peripheral circulation. Grossly normal heart sounds.   Respiratory: Normal respiratory effort.  No retractions. Lungs CTAB. Gastrointestinal: Soft with right sided abdominal pain. No rebound or guarding. No distention.  Musculoskeletal: No lower extremity tenderness nor edema. No gross deformities of extremities. Neurologic:  Normal speech and language. No gross focal neurologic deficits  are appreciated.  Skin:  Skin is warm, dry and intact. No rash noted.  ____________________________________________   LABS (all labs ordered are listed, but only abnormal results are displayed)  Labs Reviewed  BASIC METABOLIC PANEL - Abnormal; Notable for the following components:      Result Value   Glucose, Bld 171 (*)    All other components within normal limits  TROPONIN I - Abnormal; Notable for the following components:   Troponin I 0.03 (*)    All other components within normal limits  CBC  HEPATIC FUNCTION PANEL  LIPASE, BLOOD  TROPONIN I   ____________________________________________  EKG   EKG Interpretation  Date/Time:  Friday Nov 20 2017 16:56:40 EDT Ventricular Rate:  83 PR Interval:  188 QRS Duration: 160 QT Interval:  416 QTC Calculation: 488 R Axis:   15 Text Interpretation:  Normal sinus rhythm Left bundle branch block Abnormal ECG No STEMI. No old tracing for comparison.  Confirmed by Nanda Quinton [phone]) on 11/20/2017 5:03:29 PM       ____________________________________________  RADIOLOGY  Dg Chest 2 View  Result Date: 11/20/2017 CLINICAL DATA:  Chest/epigastric pain EXAM: CHEST - 2 VIEW COMPARISON:  08/06/2017 FINDINGS: Lungs are clear.  No pleural effusion or pneumothorax. The heart is normal in size. Mild degenerative changes of the visualized thoracolumbar spine. IMPRESSION: Normal chest radiographs. Electronically Signed   By: Julian Hy M.D.   On: 11/20/2017 17:51    ____________________________________________   PROCEDURES  Procedure(s) performed:   Procedures  None ____________________________________________   INITIAL IMPRESSION / ASSESSMENT AND PLAN / ED COURSE  Pertinent labs & imaging results that were available during my care of the patient were reviewed by me and considered in my medical decision making (see chart for details).  Patient presents to the emergency department for evaluation of substernal chest pain  with associated abdominal discomfort.  His abdominal pain is worse on the right side.  He does have some tenderness there but states this is more of his chronic abdominal pain.  His chest pain worsened this afternoon.  He feels fatigued and lightheaded.  He does have history of systolic heart failure and is followed by Dr. Radford Pax as an outpatient.  Reports his last heart catheterization was in [place] in 2016 and states that no stents were placed at that time. The CP he is experiencing does feel similar to that episode.   Patient labs reviewed along with CXR and EKG. No acute findings. Given the patient history and risk factors I advised that the patient be admitted for further labs and provocative testing. Patient refuses admission at this time stating that he feels much better. I discussed that I am concerned that his CP could be heart related and failing to diagnose an AMI could lead to significant disability or even death. Patient verbalizes this understanding. He will consent to trending troponin which was done and negative. He plans to call Dr. Radford Pax, his Cardiologist, on Monday and return to the ED immediately if CP returns over the weekend.  ____________________________________________  FINAL CLINICAL IMPRESSION(S) / ED DIAGNOSES  Final diagnoses:  Precordial chest pain  Pain of upper abdomen     MEDICATIONS GIVEN DURING THIS VISIT:  Medications  nitroGLYCERIN (NITROGLYN) 2 % ointment 1 inch (1 inch Topical Given 11/20/17 1734)   Note:  This document was prepared using Dragon voice recognition software and may include unintentional dictation errors.  Nanda Quinton, MD Emergency Medicine    Long, [facility], MD 11/21/17 1013

## 2017-11-20 NOTE — ED Triage Notes (Signed)
Pain in his mid abdomen and sternal chest all day. Pain got worse an hour ago. He took an ASA this am.

## 2017-12-02 ENCOUNTER — Encounter: Payer: Self-pay | Admitting: Medical

## 2017-12-02 ENCOUNTER — Ambulatory Visit (INDEPENDENT_AMBULATORY_CARE_PROVIDER_SITE_OTHER): Payer: Commercial Managed Care - PPO | Admitting: Medical

## 2017-12-02 ENCOUNTER — Telehealth: Payer: Self-pay | Admitting: Medical

## 2017-12-02 VITALS — BP 145/80 | Temp 98.1°F | Resp 16 | Ht 69.0 in | Wt 189.0 lb

## 2017-12-02 DIAGNOSIS — R42 Dizziness and giddiness: Secondary | ICD-10-CM | POA: Diagnosis not present

## 2017-12-02 DIAGNOSIS — R0600 Dyspnea, unspecified: Secondary | ICD-10-CM

## 2017-12-02 DIAGNOSIS — I429 Cardiomyopathy, unspecified: Secondary | ICD-10-CM | POA: Diagnosis not present

## 2017-12-02 DIAGNOSIS — R1013 Epigastric pain: Secondary | ICD-10-CM | POA: Diagnosis not present

## 2017-12-02 DIAGNOSIS — I10 Essential (primary) hypertension: Secondary | ICD-10-CM

## 2017-12-02 DIAGNOSIS — I509 Heart failure, unspecified: Secondary | ICD-10-CM

## 2017-12-02 LAB — CBC WITH DIFFERENTIAL/PLATELET
Basophils Absolute: 0 10*3/uL (ref 0.0–0.1)
Basophils Relative: 0.3 % (ref 0.0–3.0)
Eosinophils Absolute: 0.1 10*3/uL (ref 0.0–0.7)
Eosinophils Relative: 0.8 % (ref 0.0–5.0)
HCT: 49.7 % (ref 39.0–52.0)
Hemoglobin: 17 g/dL (ref 13.0–17.0)
Lymphocytes Relative: 25.4 % (ref 12.0–46.0)
Lymphs Abs: 1.8 10*3/uL (ref 0.7–4.0)
MCHC: 34.2 g/dL (ref 30.0–36.0)
MCV: 95.9 fl (ref 78.0–100.0)
Monocytes Absolute: 0.7 10*3/uL (ref 0.1–1.0)
Monocytes Relative: 9.8 % (ref 3.0–12.0)
Neutro Abs: 4.6 10*3/uL (ref 1.4–7.7)
Neutrophils Relative %: 63.7 % (ref 43.0–77.0)
Platelets: 275 10*3/uL (ref 150.0–400.0)
RBC: 5.18 Mil/uL (ref 4.22–5.81)
RDW: 13.5 % (ref 11.5–15.5)
WBC: 7.2 10*3/uL (ref 4.0–10.5)

## 2017-12-02 LAB — COMPREHENSIVE METABOLIC PANEL
ALT: 28 U/L (ref 0–53)
AST: 21 U/L (ref 0–37)
Albumin: 4.7 g/dL (ref 3.5–5.2)
Alkaline Phosphatase: 70 U/L (ref 39–117)
BUN: 15 mg/dL (ref 6–23)
CO2: 33 mEq/L — ABNORMAL HIGH (ref 19–32)
Calcium: 10.3 mg/dL (ref 8.4–10.5)
Chloride: 101 mEq/L (ref 96–112)
Creatinine, Ser: 1.04 mg/dL (ref 0.40–1.50)
GFR: 77.97 mL/min (ref >60.00)
Glucose, Bld: 93 mg/dL (ref 70–99)
Potassium: 3.9 mEq/L (ref 3.5–5.1)
Sodium: 144 mEq/L (ref 135–145)
Total Bilirubin: 0.5 mg/dL (ref 0.2–1.2)
Total Protein: 7.5 g/dL (ref 6.0–8.3)

## 2017-12-02 LAB — LIPASE: Lipase: 33 U/L (ref 11.0–59.0)

## 2017-12-02 LAB — TROPONIN I: TNIDX: 0.02 ug/l (ref 0.00–0.06)

## 2017-12-02 LAB — BRAIN NATRIURETIC PEPTIDE: Pro B Natriuretic peptide (BNP): 127 pg/mL — ABNORMAL HIGH (ref 0.0–100.0)

## 2017-12-02 LAB — AMYLASE: Amylase: 37 U/L (ref 27–131)

## 2017-12-02 MED ORDER — MECLIZINE HCL 12.5 MG PO TABS: 12.5000 mg | ORAL_TABLET | Freq: Three times a day (TID) | ORAL | 0 refills | Status: AC | PRN

## 2017-12-02 NOTE — Telephone Encounter (Signed)
Referral to Dr. Radford Pax cardiologist placed.
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
## 2017-12-02 NOTE — Patient Instructions (Addendum)
For your history of recent chronic daily lightheadedness/dizziness, I want to get a metabolic panel and CBC.  Your neurologic exam was completely normal today.  I still want to watch you closely.  If you have any motor or sensory deficits as explained then recommend ED evaluation.  Also if any mental status changes as we discussed then be seen in ED as well.  You do exercise daily but reports some shortness of breath worse from your baseline.  You also had mild borderline elevation of troponin in the emergency department.  On repeat cardiac protein testing was improved.  I want to repeat heart failure protein study today and troponin.  Also get a chest x-ray.  If blood tests are abnormal particularly any troponin elevation then will need to recommend ED evaluation.  ekg showed sinus rhythm. Left bundle branch block. ekg compared prior and virtually same. Left branch block not new.  For any constant or severe dizziness, I am making meclizine available.  Continue your medications for CHF and hypertension.  After review of above studies will refer you back to your cardiologist.  I think you would benefit from echo cardiogram.  Follow-up in 7 to 10 days or as needed.

## 2017-12-03 ENCOUNTER — Telehealth: Payer: Self-pay | Admitting: Medical

## 2017-12-03 ENCOUNTER — Ambulatory Visit (HOSPITAL_BASED_OUTPATIENT_CLINIC_OR_DEPARTMENT_OTHER)
Admission: RE | Admit: 2017-12-03 | Discharge: 2017-12-03 | Disposition: A | Discharge status: Home / Self Care | Payer: Commercial Managed Care - PPO | Source: Ambulatory Visit | Attending: Medical | Admitting: Medical

## 2017-12-03 DIAGNOSIS — R0600 Dyspnea, unspecified: Secondary | ICD-10-CM | POA: Insufficient documentation

## 2017-12-03 DIAGNOSIS — R0602 Shortness of breath: Secondary | ICD-10-CM | POA: Diagnosis not present

## 2017-12-03 DIAGNOSIS — R079 Chest pain, unspecified: Secondary | ICD-10-CM | POA: Diagnosis not present

## 2017-12-03 DIAGNOSIS — I429 Cardiomyopathy, unspecified: Secondary | ICD-10-CM | POA: Diagnosis not present

## 2017-12-03 MED ORDER — FUROSEMIDE 20 MG PO TABS
20.0000 mg | ORAL_TABLET | Freq: Every day | ORAL | 0 refills | Status: DC
Start: 2017-12-03 — End: 2019-10-07

## 2017-12-03 NOTE — Telephone Encounter (Signed)
Dc hctz  For 2 days while on lasix for 2 days. Then reassess on Monday. Asked pt to call and give [place] update.

## 2017-12-10 DIAGNOSIS — S61210A Laceration without foreign body of right index finger without damage to nail, initial encounter: Secondary | ICD-10-CM | POA: Diagnosis not present

## 2017-12-18 DIAGNOSIS — S61210D Laceration without foreign body of right index finger without damage to nail, subsequent encounter: Secondary | ICD-10-CM | POA: Diagnosis not present

## 2017-12-18 DIAGNOSIS — Z4802 Encounter for removal of sutures: Secondary | ICD-10-CM | POA: Diagnosis not present

## 2017-12-29 ENCOUNTER — Encounter: Payer: Self-pay | Admitting: Adult Health

## 2017-12-29 ENCOUNTER — Ambulatory Visit (INDEPENDENT_AMBULATORY_CARE_PROVIDER_SITE_OTHER): Payer: Commercial Managed Care - PPO | Admitting: Adult Health

## 2017-12-29 VITALS — BP 135/84 | HR 66 | Ht 69.0 in | Wt 185.8 lb

## 2017-12-29 DIAGNOSIS — I10 Essential (primary) hypertension: Secondary | ICD-10-CM

## 2017-12-29 DIAGNOSIS — G4733 Obstructive sleep apnea (adult) (pediatric): Secondary | ICD-10-CM | POA: Diagnosis not present

## 2017-12-29 DIAGNOSIS — I420 Dilated cardiomyopathy: Secondary | ICD-10-CM | POA: Diagnosis not present

## 2017-12-29 DIAGNOSIS — J439 Emphysema, unspecified: Secondary | ICD-10-CM | POA: Diagnosis not present

## 2017-12-29 NOTE — Patient Instructions (Signed)
Medication Instructions:  NO CHANGES- Your physician recommends that you continue on your current medications as directed. Please refer to the Current Medication list given to you today.  If you need a refill on your cardiac medications before your next appointment, please call your pharmacy.  Follow-Up: Your physician wants you to follow-up in: 6 months with Dr Radford Pax. You should receive a reminder letter in the mail two months in advance. If you do not receive a letter, please call our office nov 2019 to schedule the jan 2020 follow-up appointment.   Thank you for choosing [facility] at [facility]!!
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
## 2018-01-01 ENCOUNTER — Ambulatory Visit: Payer: Medicare Other | Admitting: Cardiology

## 2018-01-04 DIAGNOSIS — I5022 Chronic systolic (congestive) heart failure: Secondary | ICD-10-CM | POA: Diagnosis not present

## 2018-01-04 DIAGNOSIS — I10 Essential (primary) hypertension: Secondary | ICD-10-CM | POA: Diagnosis not present

## 2018-01-04 DIAGNOSIS — I340 Nonrheumatic mitral (valve) insufficiency: Secondary | ICD-10-CM | POA: Diagnosis not present

## 2018-01-15 ENCOUNTER — Telehealth: Payer: Self-pay | Admitting: *Deleted

## 2018-01-15 NOTE — Telephone Encounter (Signed)
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 Patient states he is back home and will resume compliance.

## 2018-01-15 NOTE — Telephone Encounter (Signed)
-----   Message from Sueanne Margarita, MD sent at 01/10/2018 12:25 PM EDT ----- Please find out why patient is noncompliant with his PAP

## 2018-01-18 DIAGNOSIS — H35372 Puckering of macula, left eye: Secondary | ICD-10-CM | POA: Diagnosis not present

## 2018-01-18 DIAGNOSIS — H33312 Horseshoe tear of retina without detachment, left eye: Secondary | ICD-10-CM | POA: Diagnosis not present

## 2018-01-18 DIAGNOSIS — H31092 Other chorioretinal scars, left eye: Secondary | ICD-10-CM | POA: Diagnosis not present

## 2018-01-18 DIAGNOSIS — H35342 Macular cyst, hole, or pseudohole, left eye: Secondary | ICD-10-CM | POA: Diagnosis not present

## 2018-01-21 DIAGNOSIS — R0609 Other forms of dyspnea: Secondary | ICD-10-CM | POA: Diagnosis not present

## 2018-01-28 ENCOUNTER — Encounter (HOSPITAL_BASED_OUTPATIENT_CLINIC_OR_DEPARTMENT_OTHER): Payer: Self-pay

## 2018-01-28 ENCOUNTER — Emergency Department (HOSPITAL_BASED_OUTPATIENT_CLINIC_OR_DEPARTMENT_OTHER)
Admission: EM | Admit: 2018-01-28 | Discharge: 2018-01-28 | Disposition: A | Discharge status: Home / Self Care | Payer: Medicare Other | Attending: Emergency Medicine | Admitting: Emergency Medicine

## 2018-01-28 ENCOUNTER — Other Ambulatory Visit: Payer: Self-pay

## 2018-01-28 ENCOUNTER — Emergency Department (HOSPITAL_BASED_OUTPATIENT_CLINIC_OR_DEPARTMENT_OTHER): Payer: Medicare Other

## 2018-01-28 DIAGNOSIS — R109 Unspecified abdominal pain: Secondary | ICD-10-CM | POA: Diagnosis present

## 2018-01-28 DIAGNOSIS — I5022 Chronic systolic (congestive) heart failure: Secondary | ICD-10-CM | POA: Insufficient documentation

## 2018-01-28 DIAGNOSIS — Z79899 Other long term (current) drug therapy: Secondary | ICD-10-CM | POA: Insufficient documentation

## 2018-01-28 DIAGNOSIS — N200 Calculus of kidney: Secondary | ICD-10-CM | POA: Diagnosis not present

## 2018-01-28 DIAGNOSIS — R1011 Right upper quadrant pain: Secondary | ICD-10-CM | POA: Diagnosis not present

## 2018-01-28 DIAGNOSIS — I110 Hypertensive heart disease with heart failure: Secondary | ICD-10-CM | POA: Diagnosis not present

## 2018-01-28 DIAGNOSIS — J449 Chronic obstructive pulmonary disease, unspecified: Secondary | ICD-10-CM | POA: Insufficient documentation

## 2018-01-28 DIAGNOSIS — R1031 Right lower quadrant pain: Secondary | ICD-10-CM | POA: Diagnosis not present

## 2018-01-28 LAB — URINALYSIS, ROUTINE W REFLEX MICROSCOPIC
Bilirubin Urine: NEGATIVE
Glucose, UA: NEGATIVE mg/dL
Ketones, ur: NEGATIVE mg/dL
Leukocytes, UA: NEGATIVE
Nitrite: NEGATIVE
Protein, ur: NEGATIVE mg/dL
Specific Gravity, Urine: 1.025 (ref 1.005–1.030)
pH: 6 (ref 5.0–8.0)

## 2018-01-28 LAB — COMPREHENSIVE METABOLIC PANEL
ALT: 23 U/L (ref 0–44)
AST: 23 U/L (ref 15–41)
Albumin: 4.3 g/dL (ref 3.5–5.0)
Alkaline Phosphatase: 64 U/L (ref 38–126)
Anion gap: 7 (ref 5–15)
BUN: 18 mg/dL (ref 6–20)
CO2: 27 mmol/L (ref 22–32)
Calcium: 9.1 mg/dL (ref 8.9–10.3)
Chloride: 105 mmol/L (ref 98–111)
Creatinine, Ser: 0.98 mg/dL (ref 0.61–1.24)
GFR calc Af Amer: >60 mL/min (ref >60)
GFR calc non Af Amer: >60 mL/min (ref >60)
Glucose, Bld: 94 mg/dL (ref 70–99)
Potassium: 3.8 mmol/L (ref 3.5–5.1)
Sodium: 139 mmol/L (ref 135–145)
Total Bilirubin: 0.7 mg/dL (ref 0.3–1.2)
Total Protein: 7.4 g/dL (ref 6.5–8.1)

## 2018-01-28 LAB — URINALYSIS, MICROSCOPIC (REFLEX)

## 2018-01-28 LAB — CBC WITH DIFFERENTIAL/PLATELET
Basophils Absolute: 0 10*3/uL (ref 0.0–0.1)
Basophils Relative: 0 %
Eosinophils Absolute: 0.1 10*3/uL (ref 0.0–0.7)
Eosinophils Relative: 1 %
HCT: 49.7 % (ref 39.0–52.0)
Hemoglobin: 17.2 g/dL — ABNORMAL HIGH (ref 13.0–17.0)
Lymphocytes Relative: 23 %
Lymphs Abs: 1.9 10*3/uL (ref 0.7–4.0)
MCH: 32.2 pg (ref 26.0–34.0)
MCHC: 34.6 g/dL (ref 30.0–36.0)
MCV: 93.1 fL (ref 78.0–100.0)
Monocytes Absolute: 0.8 10*3/uL (ref 0.1–1.0)
Monocytes Relative: 10 %
Neutro Abs: 5.5 10*3/uL (ref 1.7–7.7)
Neutrophils Relative %: 66 %
Platelets: 237 10*3/uL (ref 150–400)
RBC: 5.34 MIL/uL (ref 4.22–5.81)
RDW: 12.4 % (ref 11.5–15.5)
WBC: 8.3 10*3/uL (ref 4.0–10.5)

## 2018-01-28 LAB — LIPASE, BLOOD: Lipase: 33 U/L (ref 11–51)

## 2018-01-28 MED ORDER — HYDROMORPHONE HCL 1 MG/ML IJ SOLN: 1.0000 mg | Freq: Once | INTRAMUSCULAR | Status: DC

## 2018-01-28 MED ORDER — KETOROLAC TROMETHAMINE 15 MG/ML IJ SOLN
15.0000 mg | Freq: Once | INTRAMUSCULAR | Status: AC
  Administered 2018-01-28: 15 mg via INTRAVENOUS
  Filled 2018-01-28: qty 1

## 2018-01-28 MED ORDER — TAMSULOSIN HCL 0.4 MG PO CAPS: 0.4000 mg | ORAL_CAPSULE | Freq: Every day | ORAL | 0 refills | Status: AC

## 2018-01-28 MED ORDER — ONDANSETRON 4 MG PO TBDP
4.0000 mg | ORAL_TABLET | Freq: Once | ORAL | Status: AC
  Administered 2018-01-28: 4 mg via ORAL
  Filled 2018-01-28: qty 1

## 2018-01-28 NOTE — Discharge Instructions (Signed)
Please follow up with urologist for reevaluation of your symptoms. If your symptoms worsen or you experience any chest pain, or shortness of breath please return to the ED.

## 2018-01-28 NOTE — ED Provider Notes (Signed)
[facility] EMERGENCY DEPARTMENT Provider Note   CSN: [id number] Arrival date & time: 01/28/18  1339     History   Chief Complaint Chief Complaint  Patient presents with  . Flank Pain    HPI Matthew Nixon is a 57 y.o. male.  57 year old male the past medical history of HTN, cardiomyopathy, COPD, PTSD presents to the ED with a chief complaint of right upper quadrant pain x 6-7 months.Patient states the pain has been constant for 7 months but worsen yesterday. Patient reports he was on his way upstairs to try to see his PCP but the pain worsen on his drive over.[name] He describes the pain as sharp/cutting and constant.Patient states the pain is better when he stretches but worse with touch. He states he has not taken any medications for relieve.He denies any fever, nausea, vomiting, urinary complaints,chest pain or shortness of breath.     Past Medical History:  Diagnosis Date  . Allergy   . Cardiomyopathy ([place])    DCM with EF 32% on echo 2014 and no ischemia on nuclear stress test.  EF improved to 40-45% by echo 02/2017  . Chronic back pain    low back  . Chronic systolic CHF (congestive heart failure), NYHA class 2 ([place]) 08/07/2016  . COPD (chronic obstructive pulmonary disease) ([place])   . Dyspnea    with exertion  . EKG, abnormal    history of left bundle block since 2002 per pt  . GERD (gastroesophageal reflux disease)   . Headache   . Hypertension   . LBBB (left bundle branch block)   . OSA on CPAP 08/07/2016  . Oxygen deficiency   . Pain    right side pain for many years  . PTSD (post-traumatic stress disorder)   . Sleep apnea     Patient Active Problem List   Diagnosis Date Noted  . Chronic cough 03/11/2017  . Dyspnea on exertion 03/11/2017  . Personal history of bronchiectasis 03/11/2017  . Abdominal pain, RUQ   . Gastroesophageal reflux disease   . Upper airway cough syndrome 11/28/2016  . Bronchiectasis without complication ([place]) 79/89/2119  .  Chronic systolic heart failure ([place]) 08/07/2016  . OSA on CPAP 08/07/2016  . LBBB (left bundle branch block)   . COPD, mild ([place]) 07/28/2014  . Cardiomyopathy ([place]) 07/28/2014  . Essential hypertension 07/28/2014  . Hemorrhoid 07/28/2014  . Polycythemia 07/28/2014  . Allergic rhinitis 07/28/2014    Past Surgical History:  Procedure Laterality Date  . ANKLE SURGERY Right   . CARDIAC CATHETERIZATION  yrs ago  . ESOPHAGOGASTRODUODENOSCOPY (EGD) WITH PROPOFOL N/A 01/13/2017   Procedure: ESOPHAGOGASTRODUODENOSCOPY (EGD) WITH PROPOFOL;  Surgeon: Irene Shipper, MD;  Location: [facility] END[facility];  Service: Endoscopy;  Laterality: N/A;  . HAND SURGERY     Right  . HAND SURGERY     Left carpel tunnel  . THUMB ARTHROSCOPY Left         Home Medications    Prior to Admission medications   Medication Sig Start Date End Date Taking? Authorizing Provider  albuterol (PROVENTIL HFA;VENTOLIN HFA) 108 (90 Base) MCG/ACT inhaler Inhale 2 puffs into the lungs every 6 (six) hours as needed for wheezing or shortness of breath. 10/21/17   Saguier, Percell Miller, PA-C  amLODipine (NORVASC) 10 MG tablet Take 10 mg by mouth daily.    [provider]  aspirin 81 MG tablet Take 81 mg by mouth daily.    [provider]  budesonide-formoterol (SYMBICORT) 80-4.5 MCG/ACT inhaler Inhale  2 puffs into the lungs 2 (two) times daily. 10/22/17   Saguier, Percell Miller, PA-C  carvedilol (COREG) 25 MG tablet Take 25 mg by mouth 2 (two) times daily with a meal.    [provider]  cetirizine (ZYRTEC) 10 MG tablet Take 10 mg by mouth daily.    [provider]  cyclobenzaprine (FLEXERIL) 10 MG tablet Take 1 tablet (10 mg total) by mouth at bedtime. 09/03/15   Saguier, Percell Miller, PA-C  digoxin (LANOXIN) 0.125 MG tablet Take 0.125 mg by mouth daily.     [provider]  doxazosin (CARDURA) 8 MG tablet Take 8 mg by mouth daily.    [provider]  doxycycline (VIBRA-TABS) 100 MG tablet Take 1 tablet  (100 mg total) by mouth 2 (two) times daily. Can gives caps or generic 10/21/17   Saguier, Percell Miller, PA-C  fluticasone [facility]) 50 MCG/ACT nasal spray Place 2 sprays into both nostrils daily. 10/21/17   Saguier, Percell Miller, PA-C  furosemide (LASIX) 20 MG tablet Take 1 tablet (20 mg total) by mouth daily. 12/03/17   Saguier, Percell Miller, PA-C  hydrALAZINE (APRESOLINE) 100 MG tablet Take 0.5 tablets (50 mg total) by mouth 3 (three) times daily. 09/02/16   Sueanne Margarita, MD  hydrOXYzine (ATARAX/VISTARIL) 10 MG tablet 1-2 tab po q 8 hours as needed itching. 02/09/17   Saguier, Percell Miller, PA-C  ipratropium (ATROVENT HFA) 17 MCG/ACT inhaler Inhale 2 puffs into the lungs every 6 (six) hours as needed for wheezing. 10/30/16   Saguier, Percell Miller, PA-C  irbesartan (AVAPRO) 300 MG tablet Take 1 tablet (300 mg total) by mouth daily. 11/28/16   Tanda Rockers, MD  ISOSORBIDE MONONITRATE PO Take 60 mg by mouth 2 (two) times daily.    [provider]  meclizine (ANTIVERT) 12.5 MG tablet Take 1 tablet (12.5 mg total) by mouth 3 (three) times daily as needed for dizziness. 12/02/17   Saguier, Percell Miller, PA-C  montelukast (SINGULAIR) 10 MG tablet Take 1 tablet (10 mg total) by mouth at bedtime. 10/21/17   Saguier, Percell Miller, PA-C  omeprazole (PRILOSEC) 40 MG capsule Take 1 capsule (40 mg total) by mouth 2 (two) times daily. 11/21/16   Irene Shipper, MD  ondansetron (ZOFRAN ODT) 8 MG disintegrating tablet Take 1 tablet (8 mg total) by mouth every 8 (eight) hours as needed for nausea or vomiting. 09/18/16   Saguier, Percell Miller, PA-C  ranitidine (ZANTAC) 150 MG capsule Take 1 capsule (150 mg total) by mouth 2 (two) times daily. 09/18/16   Saguier, Percell Miller, PA-C  spironolactone (ALDACTONE) 25 MG tablet Take 25 mg by mouth daily.    [provider]  tobramycin (TOBREX) 0.3 % ophthalmic solution Place 2 drops into the left eye every 6 (six) hours. 10/21/17   Saguier, Percell Miller, PA-C  traMADol [facility]) 50 MG tablet 1-2 every 4 hours as needed for cough  or pain 11/28/16   Tanda Rockers, MD    Family History Family History  Problem Relation Age of Onset  . Heart disease Mother   . Heart disease Father   . Colon cancer Neg Hx   . Esophageal cancer Neg Hx   . Pancreatic cancer Neg Hx   . Stomach cancer Neg Hx     Social History Social History   Tobacco Use  . Smoking status: Never Smoker  . Smokeless tobacco: Never Used  Substance Use Topics  . Alcohol use: No  . Drug use: No     Allergies   Patient has no known allergies.  Review of Systems Review of Systems  Constitutional: Negative for chills and fever.  HENT: Negative for ear pain and sore throat.   Eyes: Negative for pain and visual disturbance.  Respiratory: Negative for cough and shortness of breath.   Cardiovascular: Negative for chest pain and palpitations.  Gastrointestinal: Positive for abdominal pain (RUQ ). Negative for diarrhea, nausea and vomiting.  Genitourinary: Positive for flank pain. Negative for difficulty urinating, dysuria, frequency and hematuria.  Musculoskeletal: Negative for arthralgias and back pain.  Skin: Negative for color change and rash.  Neurological: Negative for seizures and syncope.  All other systems reviewed and are negative.    Physical Exam Updated Vital Signs BP (!) 159/78 (BP Location: Left Arm)   Pulse 63   Temp 98.5 F (36.9 C) (Oral)   Resp 18   Ht 5\' 9"  (1.753 m)   Wt 83.5 kg   SpO2 98%   BMI 27.17 kg/m   Physical Exam  Constitutional: He is oriented to person, place, and time. He appears well-developed and well-nourished.  HENT:  Head: Normocephalic and atraumatic.  Mouth/Throat: Oropharynx is clear and moist.  Eyes: Pupils are equal, round, and reactive to light. No scleral icterus.  Neck: Normal range of motion.  Cardiovascular: Normal heart sounds.  Pulmonary/Chest: Effort normal and breath sounds normal. He has no wheezes. He exhibits no tenderness.  Abdominal: He exhibits no distension. Bowel  sounds are decreased. There is tenderness in the right upper quadrant. There is guarding. There is no CVA tenderness and no tenderness at McBurney's point.    Pain is reproducible with palpation of RUQ, there is no radiation of pain.  Musculoskeletal: He exhibits no tenderness or deformity.  Neurological: He is alert and oriented to person, place, and time.  Skin: Skin is warm and dry.  Nursing note and vitals reviewed.    ED Treatments / Results  Labs (all labs ordered are listed, but only abnormal results are displayed) Labs Reviewed  URINALYSIS, ROUTINE W REFLEX MICROSCOPIC - Abnormal; Notable for the following components:      Result Value   Hgb urine dipstick SMALL (*)    All other components within normal limits  CBC WITH DIFFERENTIAL/PLATELET - Abnormal; Notable for the following components:   Hemoglobin 17.2 (*)    All other components within normal limits  URINALYSIS, MICROSCOPIC (REFLEX) - Abnormal; Notable for the following components:   Bacteria, UA RARE (*)    All other components within normal limits  COMPREHENSIVE METABOLIC PANEL  LIPASE, BLOOD    EKG None  Radiology US Abdomen Complete  Result Date: 01/28/2018 CLINICAL DATA:  Chronic right upper quadrant abdominal pain. EXAM: ABDOMEN ULTRASOUND COMPLETE COMPARISON:  CT scan of October 14, 2016. FINDINGS: Gallbladder: No gallstones or wall thickening visualized. No sonographic Murphy sign noted by sonographer. Common bile duct: Diameter: 3.6 mm which is within normal limits. Liver: No focal lesion identified. Within normal limits in parenchymal echogenicity. Portal vein is patent on color Doppler imaging with normal direction of blood flow towards the liver. IVC: No abnormality visualized. Pancreas: Visualized portion unremarkable. Spleen: Size and appearance within normal limits. Right Kidney: Length: 10.6 cm. Probable 8 mm nonobstructive calculus seen in upper pole. Echogenicity within normal limits. No mass or  hydronephrosis visualized. Left Kidney: Length: 11.8 cm. Echogenicity within normal limits. No mass or hydronephrosis visualized. Abdominal aorta: No aneurysm visualized. Other findings: None. IMPRESSION: Possible nonobstructive right nephrolithiasis. No other abnormality seen in the abdomen. Electronically Signed   By: Jeneen Rinks  Murlean Caller, M.D.   On: 01/28/2018 16:42    Procedures Procedures (including critical care time)  Medications Ordered in ED Medications  ondansetron (ZOFRAN-ODT) disintegrating tablet 4 mg (4 mg Oral Given 01/28/18 1707)  ketorolac (TORADOL) 15 MG/ML injection 15 mg (15 mg Intravenous Given 01/28/18 1708)     Initial Impression / Assessment and Plan / ED Course  I have reviewed the triage vital signs and the nursing notes.  Pertinent labs & imaging results that were available during my care of the patient were reviewed by me and considered in my medical decision making (see chart for details).     Patient presents with RUQ pain. Upon examination there is guarding of the RUQ, No CVA was note don exam on right or left side. Patient appears very uncomfortable during exam, placed an order for dilaudid but patient does not have a ride.I spoke to patient about getting a ride so he could receive pain medication but patient stated he was ok x 2.  UA showed rare bacteria present, he denies any urinary symptoms at this time but there is a small amount of Hgb present. CMP showed no electrolyte abnormality, no elevations of LFT's and lipase was within normal limits.  CBC showed slight elevation of hemoglobin at 17.2. A [place] complete will be order to r/o any gallbladder pathology or nephrolithiasis. No jaundice, fever, or lipase elevation at this time I believe this is less likely to be gallbladder, liver or pancreas pathology. [place] Complete abdomen showed a 8 mm non obstructive  Kidney stone seen in the upper pole.Patient state the pain is worse with movement.  Patient receive 15mg  of  toradol and states the pain is better.   He states he will be seeing his PCP tomorrow. I will place patient on tamsulosin 0.5mg  for 7 days to allow kidney stone to pass.I have also given him a urologist referral. Patient understand and agrees with plan.Return precautions provided. Final Clinical Impressions(s) / ED Diagnoses   Final diagnoses:  Right flank pain  Nephrolithiasis    ED Discharge Orders    None       Janeece Fitting, Hershal Coria 01/28/18 3159    Tegeler, Gwenyth Allegra, MD 02/01/18 [phone]

## 2018-01-28 NOTE — ED Notes (Signed)
ED Provider at bedside. 

## 2018-01-28 NOTE — ED Triage Notes (Addendum)
Pt c/o sharp pain to R flank that has been present x 2 months suddenly worse today. Pt denies urinary symptoms, N/V/D, or fevers.

## 2018-01-31 NOTE — Progress Notes (Addendum)
Subjective:    Patient ID: Matthew Nixon, male    DOB: [birth year], 57 y.o.   MRN: [id number]  HPI   Patient is a 57 yr old male who presents today for ER follow up. ER record is reviewed.  He was seen on 01/28/18 with c/o RUQ pain which had been present x 6-7 months. Pain had worsened the day prior to his visit. He had reproducible RUQ pain.  [place] of abdomen noted ? Nonobstructive R nephrolithiasis.  He was given toradol, tamsulosis and urology referral.      Review of Systems See HPI  Past Medical History:  Diagnosis Date  . Allergy   . Cardiomyopathy ([place])    DCM with EF 32% on echo 2014 and no ischemia on nuclear stress test.  EF improved to 40-45% by echo 02/2017  . Chronic back pain    low back  . Chronic systolic CHF (congestive heart failure), NYHA class 2 ([place]) 08/07/2016  . COPD (chronic obstructive pulmonary disease) ([place])   . Dyspnea    with exertion  . EKG, abnormal    history of left bundle block since 2002 per pt  . GERD (gastroesophageal reflux disease)   . Headache   . Hypertension   . LBBB (left bundle branch block)   . OSA on CPAP 08/07/2016  . Oxygen deficiency   . Pain    right side pain for many years  . PTSD (post-traumatic stress disorder)   . Sleep apnea      Social History   Socioeconomic History  . Marital status: Married    Spouse name: Not on file  . Number of children: Not on file  . Years of education: Not on file  . Highest education level: Not on file  Occupational History  . Not on file  Social Needs  . Financial resource strain: Not on file  . Food insecurity:    Worry: Not on file    Inability: Not on file  . Transportation needs:    Medical: Not on file    Non-medical: Not on file  Tobacco Use  . Smoking status: Never Smoker  . Smokeless tobacco: Never Used  Substance and Sexual Activity  . Alcohol use: No  . Drug use: No  . Sexual activity: Not on file  Lifestyle  . Physical activity:    Days per week: Not on file   Minutes per session: Not on file  . Stress: Not on file  Relationships  . Social connections:    Talks on phone: Not on file    Gets together: Not on file    Attends religious service: Not on file    Active member of club or organization: Not on file    Attends meetings of clubs or organizations: Not on file    Relationship status: Not on file  . Intimate partner violence:    Fear of current or ex partner: Not on file    Emotionally abused: Not on file    Physically abused: Not on file    Forced sexual activity: Not on file  Other Topics Concern  . Not on file  Social History Narrative   2 years college     Past Surgical History:  Procedure Laterality Date  . ANKLE SURGERY Right   . CARDIAC CATHETERIZATION  yrs ago  . ESOPHAGOGASTRODUODENOSCOPY (EGD) WITH PROPOFOL N/A 01/13/2017   Procedure: ESOPHAGOGASTRODUODENOSCOPY (EGD) WITH PROPOFOL;  Surgeon: Irene Shipper, MD;  Location: [facility];  Service: Endoscopy;  Laterality: N/A;  . HAND SURGERY     Right  . HAND SURGERY     Left carpel tunnel  . THUMB ARTHROSCOPY Left     Family History  Problem Relation Age of Onset  . Heart disease Mother   . Heart disease Father   . Colon cancer Neg Hx   . Esophageal cancer Neg Hx   . Pancreatic cancer Neg Hx   . Stomach cancer Neg Hx     No Known Allergies  Current Outpatient Medications on File Prior to Visit  Medication Sig Dispense Refill  . albuterol (PROVENTIL HFA;VENTOLIN HFA) 108 (90 Base) MCG/ACT inhaler Inhale 2 puffs into the lungs every 6 (six) hours as needed for wheezing or shortness of breath. 1 Inhaler 0  . amLODipine (NORVASC) 10 MG tablet Take 10 mg by mouth daily.    [name] aspirin 81 MG tablet Take 81 mg by mouth daily.    . budesonide-formoterol (SYMBICORT) 80-4.5 MCG/ACT inhaler Inhale 2 puffs into the lungs 2 (two) times daily. 1 Inhaler 3  . carvedilol (COREG) 25 MG tablet Take 25 mg by mouth 2 (two) times daily with a meal.    . cetirizine (ZYRTEC) 10 MG  tablet Take 10 mg by mouth daily.    . cyclobenzaprine (FLEXERIL) 10 MG tablet Take 1 tablet (10 mg total) by mouth at bedtime. 14 tablet 0  . digoxin (LANOXIN) 0.125 MG tablet Take 0.125 mg by mouth daily.     [name] doxazosin (CARDURA) 8 MG tablet Take 8 mg by mouth daily.    [name] doxycycline (VIBRA-TABS) 100 MG tablet Take 1 tablet (100 mg total) by mouth 2 (two) times daily. Can gives caps or generic 20 tablet 0  . fluticasone (FLONASE) 50 MCG/ACT nasal spray Place 2 sprays into both nostrils daily. 16 g 1  . furosemide (LASIX) 20 MG tablet Take 1 tablet (20 mg total) by mouth daily. 14 tablet 0  . hydrALAZINE (APRESOLINE) 100 MG tablet Take 0.5 tablets (50 mg total) by mouth 3 (three) times daily. 90 tablet 11  . hydrOXYzine (ATARAX/VISTARIL) 10 MG tablet 1-2 tab po q 8 hours as needed itching. 30 tablet 0  . ipratropium (ATROVENT HFA) 17 MCG/ACT inhaler Inhale 2 puffs into the lungs every 6 (six) hours as needed for wheezing. 1 Inhaler 3  . irbesartan (AVAPRO) 300 MG tablet Take 1 tablet (300 mg total) by mouth daily. 30 tablet 11  . ISOSORBIDE MONONITRATE PO Take 60 mg by mouth 2 (two) times daily.    . meclizine (ANTIVERT) 12.5 MG tablet Take 1 tablet (12.5 mg total) by mouth 3 (three) times daily as needed for dizziness. 30 tablet 0  . montelukast (SINGULAIR) 10 MG tablet Take 1 tablet (10 mg total) by mouth at bedtime. 30 tablet 3  . omeprazole (PRILOSEC) 40 MG capsule Take 1 capsule (40 mg total) by mouth 2 (two) times daily. 180 capsule 3  . ondansetron (ZOFRAN ODT) 8 MG disintegrating tablet Take 1 tablet (8 mg total) by mouth every 8 (eight) hours as needed for nausea or vomiting. 20 tablet 0  . ranitidine (ZANTAC) 150 MG capsule Take 1 capsule (150 mg total) by mouth 2 (two) times daily. 60 capsule 0  . spironolactone (ALDACTONE) 25 MG tablet Take 25 mg by mouth daily.    . tamsulosin (FLOMAX) 0.4 MG CAPS capsule Take 1 capsule (0.4 mg total) by mouth daily for 7 days. 7 capsule 0  .  tobramycin (TOBREX) 0.3 % ophthalmic solution  Place 2 drops into the left eye every 6 (six) hours. 5 mL 0  . traMADol (ULTRAM) 50 MG tablet 1-2 every 4 hours as needed for cough or pain 40 tablet 0   No current facility-administered medications on file prior to visit.     BP (!) 150/81 (BP Location: Right Arm, Patient Position: Sitting, Cuff Size: Small)   Pulse 65   Temp 99 F (37.2 C) (Oral)   Resp 16   Ht 5\' 9"  (1.753 m)   Wt 185 lb (83.9 kg)   SpO2 100%   BMI 27.32 kg/m       Objective:   Physical Exam  Constitutional: He is oriented to person, place, and time. He appears well-developed and well-nourished. No distress.  HENT:  Head: Normocephalic and atraumatic.  Cardiovascular: Normal rate and regular rhythm.  No murmur heard. Pulmonary/Chest: Effort normal and breath sounds normal. No respiratory distress. He has no wheezes. He has no rales.  Abdominal: Soft.  + right sided tenderness with guarding  Genitourinary:  Genitourinary Comments: + R CVAT  Musculoskeletal: He exhibits no edema.  Neurological: He is alert and oriented to person, place, and time.  Skin: Skin is warm and dry.  Psychiatric: He has a normal mood and affect. His behavior is normal. Thought content normal.          Assessment & Plan:  Flank pain- [place] did not show definite obstruction.  Will obtain CT abd/pelvis with kidney stone protocol to further evaluate.  Continue flomax, refer to urology. UA unremarkable.  Send urine for culture.  rx sent for hydrocodone, reviewed [place] controlled substance registry and refills appropriate.  Addendum:  CT negative for stone. Will cancel  Urology referral.

## 2018-02-01 ENCOUNTER — Telehealth: Payer: Self-pay | Admitting: Medical

## 2018-02-01 ENCOUNTER — Telehealth: Payer: Self-pay | Admitting: Family

## 2018-02-01 ENCOUNTER — Ambulatory Visit (INDEPENDENT_AMBULATORY_CARE_PROVIDER_SITE_OTHER): Payer: Commercial Managed Care - PPO | Admitting: Family

## 2018-02-01 ENCOUNTER — Ambulatory Visit (HOSPITAL_BASED_OUTPATIENT_CLINIC_OR_DEPARTMENT_OTHER)
Admission: RE | Admit: 2018-02-01 | Discharge: 2018-02-01 | Disposition: A | Discharge status: Home / Self Care | Payer: Commercial Managed Care - PPO | Source: Ambulatory Visit | Attending: Family | Admitting: Family

## 2018-02-01 ENCOUNTER — Encounter: Payer: Self-pay | Admitting: Family

## 2018-02-01 VITALS — BP 150/81 | HR 65 | Temp 99.0°F | Resp 16 | Ht 69.0 in | Wt 185.0 lb

## 2018-02-01 DIAGNOSIS — R109 Unspecified abdominal pain: Secondary | ICD-10-CM | POA: Insufficient documentation

## 2018-02-01 LAB — POC URINALSYSI DIPSTICK (AUTOMATED)
Bilirubin, UA: NEGATIVE
Blood, UA: NEGATIVE
Glucose, UA: NEGATIVE
Leukocytes, UA: NEGATIVE
Nitrite, UA: NEGATIVE
Protein, UA: NEGATIVE
Spec Grav, UA: >=1.03 — AB (ref 1.010–1.025)
Urobilinogen, UA: 1 E.U./dL
pH, UA: 5.5 (ref 5.0–8.0)

## 2018-02-01 MED ORDER — HYDROCODONE-ACETAMINOPHEN 5-325 MG PO TABS: 1.0000 | ORAL_TABLET | Freq: Four times a day (QID) | ORAL | 0 refills | Status: AC | PRN

## 2018-02-01 NOTE — Telephone Encounter (Signed)
Patient states he still has pain in the right side. Given CT results.

## 2018-02-01 NOTE — Telephone Encounter (Signed)
Pt calling to get CT results.

## 2018-02-01 NOTE — Telephone Encounter (Signed)
I talked with pt about his negative CT study. Melissa advised to cancel urologist appointment. I feel like I need to see pt tomorrow if we have cancellation. If not then give pt 1:20 Wednesday slot.   I advised pt that if pain worsens or severe then return to ED as sometimes condition can change and repeat ct imaging might reveal positive finding. Pt expressed understanding.   Need to talk with you tomorrow morning about schedule to see if we might be able to get him in.

## 2018-02-01 NOTE — Patient Instructions (Addendum)
Please complete CT scan on the first floor. You may use hydrocodone as needed for severe pain.  Do not drive after taking. You should be contacted about your referral to urology.  Go to ER if new/worsening pain.

## 2018-02-01 NOTE — Telephone Encounter (Signed)
Called pt see my telephone note.

## 2018-02-01 NOTE — Telephone Encounter (Signed)
CT is normal. No stone. I wonder if he may have passed the stone already.  I don't see reason for him to see urology.  Will cancel referral.  He can stop flomax and let me know if abdominal pain worsens, if he develops fever or if pain is not improved in the next 3 days.

## 2018-02-01 NOTE — Telephone Encounter (Signed)
I spoke with pt. Reviewed results. Questions answered. Advised him to let us know if symptoms worsen or if symptoms do not improve. Pt verbalizes understanding.

## 2018-02-01 NOTE — Addendum Note (Signed)
Addended by: Debbrah Alar on: 02/01/2018 09:34 AM   Modules accepted: Orders

## 2018-02-02 LAB — URINE CULTURE
MICRO NUMBER:: 90952640
Result:: NO GROWTH
SPECIMEN QUALITY:: ADEQUATE

## 2018-02-02 NOTE — Telephone Encounter (Signed)
Author phoned pt. to set up an OV for today per Edward's request. Pt. stated he could not do today, but could do tmr. Appointment for 8/14 made for 120PM; Percell Miller made aware.

## 2018-02-03 ENCOUNTER — Ambulatory Visit (INDEPENDENT_AMBULATORY_CARE_PROVIDER_SITE_OTHER): Payer: Commercial Managed Care - PPO | Admitting: Medical

## 2018-02-03 ENCOUNTER — Telehealth: Payer: Self-pay | Admitting: Medical

## 2018-02-03 ENCOUNTER — Encounter: Payer: Self-pay | Admitting: Medical

## 2018-02-03 ENCOUNTER — Ambulatory Visit (HOSPITAL_BASED_OUTPATIENT_CLINIC_OR_DEPARTMENT_OTHER)
Admission: RE | Admit: 2018-02-03 | Discharge: 2018-02-03 | Disposition: A | Discharge status: Home / Self Care | Payer: Commercial Managed Care - PPO | Source: Ambulatory Visit | Attending: Medical | Admitting: Medical

## 2018-02-03 VITALS — BP 156/79 | HR 81 | Temp 97.8°F | Resp 16 | Ht 69.0 in | Wt 186.8 lb

## 2018-02-03 DIAGNOSIS — M25561 Pain in right knee: Secondary | ICD-10-CM | POA: Diagnosis not present

## 2018-02-03 DIAGNOSIS — M769 Unspecified enthesopathy, lower limb, excluding foot: Secondary | ICD-10-CM | POA: Insufficient documentation

## 2018-02-03 DIAGNOSIS — R1011 Right upper quadrant pain: Secondary | ICD-10-CM

## 2018-02-03 DIAGNOSIS — G8929 Other chronic pain: Secondary | ICD-10-CM | POA: Insufficient documentation

## 2018-02-03 DIAGNOSIS — K828 Other specified diseases of gallbladder: Secondary | ICD-10-CM

## 2018-02-03 NOTE — Patient Instructions (Addendum)
For history of rt side abdomen pain will refer you back to Dr. Henrene Pastor. It appears you were to get HIDA scan done but I don't see that was done.  So I will place a referral and see if he needs  to see you or if he will go ahead and order the HIDA scan.  During the interim recommend continue zantac and can use tylenol for mild-moderate pain.   If pain worsens/severe then ED evaluation as you may need repeat imaging ct scan but presently not the case. Not clear that former diagnosis of kidney stone was the problem as your pain persists. I don't think appendix pain or diverticular pain presently.  Follow up in 7 days or as needed

## 2018-02-03 NOTE — Progress Notes (Signed)
Subjective:    Patient ID: Matthew Nixon, male    DOB: 02-19-1961, 57 y.o.   MRN: [id number]  HPI  Pt in for evaluation. He was having rt side abdomen for which he was evaluated in ED and then saw NP Melissa in our office. In ED US showed possible nonobstructive stone. Pt had ct of abdomen and pelvis later  but that study was clear with no kidney stone.  Overall pain for 6-7 months per ED note. But some worse pain recently.   Pt was present yesterday all day. When he woke this morning pain was gone. Then mid exam when he got up to sit on exam table noted pain came back. Pain on palpation 4/10.   Pt has no nausea or vomiting. No fever, no chills. Pt appetite is is slight decreased. No diarrhea.  Pt had cologuard and he states was negative.  He saw Dr. Henrene Pastor and report mentioned sludge in [place]. They recommended getting hida scan. He does report feeling bloated often. It was recommended that he get HIDA scan. I don't see that he had that done.  On review no rash on skin nor skin sensitive to light touch.  On prior study ct left side diverticulosis. No black or bloody stools.  Pt never got norco filled.   Pt is reporting some burping occasional. Sometimes sour taste in mouth.    Review of Systems  Constitutional: Negative for chills, diaphoresis, fatigue and fever.  Respiratory: Negative for chest tightness.   Cardiovascular: Negative for chest pain and palpitations.  Gastrointestinal: Positive for abdominal pain. Negative for abdominal distention, blood in stool, constipation, diarrhea, nausea and vomiting.       See hpi.  Musculoskeletal: Negative for back pain.       Recent rt knee pain. Pain recently sometimes on standing and when jogs. Pain medial aspect.  Skin: Negative for rash.  Hematological: Negative for adenopathy. Does not bruise/bleed easily.  Psychiatric/Behavioral: Negative for behavioral problems and confusion.   Past Medical History:  Diagnosis Date    . Allergy   . Cardiomyopathy ([place])    DCM with EF 32% on echo 2014 and no ischemia on nuclear stress test.  EF improved to 40-45% by echo 02/2017  . Chronic back pain    low back  . Chronic systolic CHF (congestive heart failure), NYHA class 2 ([place]) 08/07/2016  . COPD (chronic obstructive pulmonary disease) ([place])   . Dyspnea    with exertion  . EKG, abnormal    history of left bundle block since 2002 per pt  . GERD (gastroesophageal reflux disease)   . Headache   . Hypertension   . LBBB (left bundle branch block)   . OSA on CPAP 08/07/2016  . Oxygen deficiency   . Pain    right side pain for many years  . PTSD (post-traumatic stress disorder)   . Sleep apnea      Social History   Socioeconomic History  . Marital status: Married    Spouse name: Not on file  . Number of children: Not on file  . Years of education: Not on file  . Highest education level: Not on file  Occupational History  . Not on file  Social Needs  . Financial resource strain: Not on file  . Food insecurity:    Worry: Not on file    Inability: Not on file  . Transportation needs:    Medical: Not on file    Non-medical: Not on file  Tobacco Use  . Smoking status: Never Smoker  . Smokeless tobacco: Never Used  Substance and Sexual Activity  . Alcohol use: No  . Drug use: No  . Sexual activity: Not on file  Lifestyle  . Physical activity:    Days per week: Not on file    Minutes per session: Not on file  . Stress: Not on file  Relationships  . Social connections:    Talks on phone: Not on file    Gets together: Not on file    Attends religious service: Not on file    Active member of club or organization: Not on file    Attends meetings of clubs or organizations: Not on file    Relationship status: Not on file  . Intimate partner violence:    Fear of current or ex partner: Not on file    Emotionally abused: Not on file    Physically abused: Not on file    Forced sexual activity: Not on file   Other Topics Concern  . Not on file  Social History Narrative   2 years college     Past Surgical History:  Procedure Laterality Date  . ANKLE SURGERY Right   . CARDIAC CATHETERIZATION  yrs ago  . ESOPHAGOGASTRODUODENOSCOPY (EGD) WITH PROPOFOL N/A 01/13/2017   Procedure: ESOPHAGOGASTRODUODENOSCOPY (EGD) WITH PROPOFOL;  Surgeon: Irene Shipper, MD;  Location: [facility];  Service: Endoscopy;  Laterality: N/A;  . HAND SURGERY     Right  . HAND SURGERY     Left carpel tunnel  . THUMB ARTHROSCOPY Left     Family History  Problem Relation Age of Onset  . Heart disease Mother   . Heart disease Father   . Colon cancer Neg Hx   . Esophageal cancer Neg Hx   . Pancreatic cancer Neg Hx   . Stomach cancer Neg Hx     No Known Allergies  Current Outpatient Medications on File Prior to Visit  Medication Sig Dispense Refill  . albuterol (PROVENTIL HFA;VENTOLIN HFA) 108 (90 Base) MCG/ACT inhaler Inhale 2 puffs into the lungs every 6 (six) hours as needed for wheezing or shortness of breath. 1 Inhaler 0  . amLODipine (NORVASC) 10 MG tablet Take 10 mg by mouth daily.    [name] aspirin 81 MG tablet Take 81 mg by mouth daily.    . budesonide-formoterol (SYMBICORT) 80-4.5 MCG/ACT inhaler Inhale 2 puffs into the lungs 2 (two) times daily. 1 Inhaler 3  . carvedilol (COREG) 25 MG tablet Take 25 mg by mouth 2 (two) times daily with a meal.    . cetirizine (ZYRTEC) 10 MG tablet Take 10 mg by mouth daily.    . cyclobenzaprine (FLEXERIL) 10 MG tablet Take 1 tablet (10 mg total) by mouth at bedtime. 14 tablet 0  . digoxin (LANOXIN) 0.125 MG tablet Take 0.125 mg by mouth daily.     [name] doxazosin (CARDURA) 8 MG tablet Take 8 mg by mouth daily.    [name] doxycycline (VIBRA-TABS) 100 MG tablet Take 1 tablet (100 mg total) by mouth 2 (two) times daily. Can gives caps or generic 20 tablet 0  . fluticasone (FLONASE) 50 MCG/ACT nasal spray Place 2 sprays into both nostrils daily. 16 g 1  . furosemide (LASIX) 20 MG  tablet Take 1 tablet (20 mg total) by mouth daily. 14 tablet 0  . hydrALAZINE (APRESOLINE) 100 MG tablet Take 0.5 tablets (50 mg total) by mouth 3 (three) times daily. 90 tablet 11  .  HYDROcodone-acetaminophen (NORCO/VICODIN) 5-325 MG tablet Take 1 tablet by mouth every 6 (six) hours as needed for up to 3 days for moderate pain. 10 tablet 0  . hydrOXYzine (ATARAX/VISTARIL) 10 MG tablet 1-2 tab po q 8 hours as needed itching. 30 tablet 0  . ipratropium (ATROVENT HFA) 17 MCG/ACT inhaler Inhale 2 puffs into the lungs every 6 (six) hours as needed for wheezing. 1 Inhaler 3  . irbesartan (AVAPRO) 300 MG tablet Take 1 tablet (300 mg total) by mouth daily. 30 tablet 11  . ISOSORBIDE MONONITRATE PO Take 60 mg by mouth 2 (two) times daily.    . meclizine (ANTIVERT) 12.5 MG tablet Take 1 tablet (12.5 mg total) by mouth 3 (three) times daily as needed for dizziness. 30 tablet 0  . montelukast (SINGULAIR) 10 MG tablet Take 1 tablet (10 mg total) by mouth at bedtime. 30 tablet 3  . omeprazole (PRILOSEC) 40 MG capsule Take 1 capsule (40 mg total) by mouth 2 (two) times daily. 180 capsule 3  . ondansetron (ZOFRAN ODT) 8 MG disintegrating tablet Take 1 tablet (8 mg total) by mouth every 8 (eight) hours as needed for nausea or vomiting. 20 tablet 0  . ranitidine (ZANTAC) 150 MG capsule Take 1 capsule (150 mg total) by mouth 2 (two) times daily. 60 capsule 0  . spironolactone (ALDACTONE) 25 MG tablet Take 25 mg by mouth daily.    . tamsulosin (FLOMAX) 0.4 MG CAPS capsule Take 1 capsule (0.4 mg total) by mouth daily for 7 days. 7 capsule 0  . tobramycin (TOBREX) 0.3 % ophthalmic solution Place 2 drops into the left eye every 6 (six) hours. 5 mL 0  . traMADol (ULTRAM) 50 MG tablet 1-2 every 4 hours as needed for cough or pain 40 tablet 0   No current facility-administered medications on file prior to visit.     BP (!) 156/79   Pulse 81   Temp 97.8 F (36.6 C) (Oral)   Resp 16   Ht 5\' 9"  (1.753 m)   Wt 186 lb  12.8 oz (84.7 kg)   SpO2 100%   BMI 27.59 kg/m       Objective:   Physical Exam   General Appearance- Not in acute distress.  HEENT Eyes- Scleraeral/Conjuntiva-bilat- Not Yellow. Mouth & Throat- Normal.  Chest and Lung Exam Auscultation: Breath sounds:-Normal. Adventitious sounds:- No Adventitious sounds.  Cardiovascular Auscultation:Rythm - Regular. Heart Sounds -Normal heart sounds.  Abdomen Inspection:-Inspection Normal.  Palpation/Perucssion: Palpation and Percussion of the abdomen reveal- mild tenderness  Rt upper quadrant and region below ruq(no rt lower quadrant pain), No Rebound tenderness, No rigidity(Guarding) and No Palpable abdominal masses.  Liver:-Normal.  Spleen:- Normal.  No pain on heel jar  Back- no cva tenderness.  Rt knee- some crepitus on rom. Medial tibial plateau.  Skin-no rash or vesicles seen.     Assessment & Plan:  For history of rt side abdomen pain will refer you back to Dr. Henrene Pastor. It appears you were to get HIDA scan done but I don't see that was done.  So I will place a referral and see if he needs to see you or if he will go ahead and order the HIDA scan.  During the interim recommend continue zantac and can use tylenol for mild-moderate pain.   If pain worsens/severe then ED evaluation as you may need repeat imaging ct scan but presently not the case. Not clear that former diagnosis of kidney stone was the problem as your pain  persists. I don't think appendix pain or diverticular pain presently.  Follow up in 7 days or as needed  [employer], [employer]

## 2018-02-03 NOTE — Telephone Encounter (Signed)
Dr. Henrene Pastor,  Pt recently had ruq/rt side abdomen pain moderate-severe and was seen in ED. Pain worse recently but present intermittent past 6-7 months. He had questionable kidney stone on [place] and then later renal stone protocol ct did now kidney stone or other finding. I did see on 01-13-2017 he had sludge in gallbladder and your note mentions hida scan to evaluate possible chronic cholecystitis. I don't think he followed through with full work up. I put in a new referral to your office. Just want to give you update. A while since you last saw him so I am thinking he will he need to see you again before test can be ordered by your office. Thanks for you help with our patients.  Mackie Pai, PA-C

## 2018-02-04 ENCOUNTER — Telehealth: Payer: Self-pay | Admitting: Medical

## 2018-02-04 DIAGNOSIS — M25569 Pain in unspecified knee: Principal | ICD-10-CM

## 2018-02-04 DIAGNOSIS — G8929 Other chronic pain: Secondary | ICD-10-CM

## 2018-02-04 NOTE — Telephone Encounter (Signed)
Thank you for the follow up information

## 2018-02-04 NOTE — Telephone Encounter (Signed)
Referred to [facility].

## 2018-02-09 ENCOUNTER — Ambulatory Visit (INDEPENDENT_AMBULATORY_CARE_PROVIDER_SITE_OTHER): Payer: Medicare Other | Admitting: Family Medicine

## 2018-02-09 VITALS — BP 157/80 | HR 63 | Ht 69.0 in | Wt 184.0 lb

## 2018-02-09 DIAGNOSIS — M25561 Pain in right knee: Secondary | ICD-10-CM

## 2018-02-09 MED ORDER — DICLOFENAC SODIUM 1 % TD GEL: 4.0000 g | Freq: Four times a day (QID) | TRANSDERMAL | 1 refills | Status: DC

## 2018-02-09 NOTE — Patient Instructions (Addendum)
You have a medial meniscus tear. Start physical therapy and do home exercises on days you don't go to therapy. Voltaren gel up to 4 times a day topically. Icing 15 minutes at a time 3-4 times a day as needed. Okay to take Tylenol 500 mg 1-2 tabs 3 times a day as needed. You can consider a knee brace for support but this is not required for this injury and will not make it heal quicker. Follow-up with me in 6 weeks.  If not improving would consider an MRI.

## 2018-02-10 ENCOUNTER — Encounter: Payer: Self-pay | Admitting: Family Medicine

## 2018-02-10 NOTE — Progress Notes (Signed)
PCP and consultation requested by: Mackie Pai, PA-C  Subjective:   HPI: Patient is a 57 y.o. male here for right knee pain.  Patient reports that he has had about 9 months of medial right knee pain. Reports that pain is not constant and when this comes on can be sharp and last anywhere from a few hours to several days at an 8 out of 10 level. No catching or locking with this. He does not take anything for this. Seems to be worse with twisting and prolonged walking. No skin changes or numbness.  Past Medical History:  Diagnosis Date  . Allergy   . Cardiomyopathy ([place])    DCM with EF 32% on echo 2014 and no ischemia on nuclear stress test.  EF improved to 40-45% by echo 02/2017  . Chronic back pain    low back  . Chronic systolic CHF (congestive heart failure), NYHA class 2 ([place]) 08/07/2016  . COPD (chronic obstructive pulmonary disease) ([place])   . Dyspnea    with exertion  . EKG, abnormal    history of left bundle block since 2002 per pt  . GERD (gastroesophageal reflux disease)   . Headache   . Hypertension   . LBBB (left bundle branch block)   . OSA on CPAP 08/07/2016  . Oxygen deficiency   . Pain    right side pain for many years  . PTSD (post-traumatic stress disorder)   . Sleep apnea     Current Outpatient Medications on File Prior to Visit  Medication Sig Dispense Refill  . albuterol (PROVENTIL HFA;VENTOLIN HFA) 108 (90 Base) MCG/ACT inhaler Inhale 2 puffs into the lungs every 6 (six) hours as needed for wheezing or shortness of breath. 1 Inhaler 0  . amLODipine (NORVASC) 10 MG tablet Take 10 mg by mouth daily.    [name] aspirin 81 MG tablet Take 81 mg by mouth daily.    . budesonide-formoterol (SYMBICORT) 80-4.5 MCG/ACT inhaler Inhale 2 puffs into the lungs 2 (two) times daily. 1 Inhaler 3  . carvedilol (COREG) 25 MG tablet Take 25 mg by mouth 2 (two) times daily with a meal.    . cetirizine (ZYRTEC) 10 MG tablet Take 10 mg by mouth daily.    . cyclobenzaprine  (FLEXERIL) 10 MG tablet Take 1 tablet (10 mg total) by mouth at bedtime. 14 tablet 0  . digoxin (LANOXIN) 0.125 MG tablet Take 0.125 mg by mouth daily.     [name] doxazosin (CARDURA) 8 MG tablet Take 8 mg by mouth daily.    [name] doxycycline (VIBRA-TABS) 100 MG tablet Take 1 tablet (100 mg total) by mouth 2 (two) times daily. Can gives caps or generic 20 tablet 0  . fluticasone (FLONASE) 50 MCG/ACT nasal spray Place 2 sprays into both nostrils daily. 16 g 1  . furosemide (LASIX) 20 MG tablet Take 1 tablet (20 mg total) by mouth daily. 14 tablet 0  . hydrALAZINE (APRESOLINE) 100 MG tablet Take 0.5 tablets (50 mg total) by mouth 3 (three) times daily. 90 tablet 11  . hydrOXYzine (ATARAX/VISTARIL) 10 MG tablet 1-2 tab po q 8 hours as needed itching. 30 tablet 0  . ipratropium (ATROVENT HFA) 17 MCG/ACT inhaler Inhale 2 puffs into the lungs every 6 (six) hours as needed for wheezing. 1 Inhaler 3  . irbesartan (AVAPRO) 300 MG tablet Take 1 tablet (300 mg total) by mouth daily. 30 tablet 11  . ISOSORBIDE MONONITRATE PO Take 60 mg by mouth 2 (two) times daily.    [name]  meclizine (ANTIVERT) 12.5 MG tablet Take 1 tablet (12.5 mg total) by mouth 3 (three) times daily as needed for dizziness. 30 tablet 0  . montelukast (SINGULAIR) 10 MG tablet Take 1 tablet (10 mg total) by mouth at bedtime. 30 tablet 3  . omeprazole (PRILOSEC) 40 MG capsule Take 1 capsule (40 mg total) by mouth 2 (two) times daily. 180 capsule 3  . ondansetron (ZOFRAN ODT) 8 MG disintegrating tablet Take 1 tablet (8 mg total) by mouth every 8 (eight) hours as needed for nausea or vomiting. 20 tablet 0  . ranitidine (ZANTAC) 150 MG capsule Take 1 capsule (150 mg total) by mouth 2 (two) times daily. 60 capsule 0  . spironolactone (ALDACTONE) 25 MG tablet Take 25 mg by mouth daily.    [name] tobramycin (TOBREX) 0.3 % ophthalmic solution Place 2 drops into the left eye every 6 (six) hours. 5 mL 0  . traMADol (ULTRAM) 50 MG tablet 1-2 every 4 hours as needed for  cough or pain 40 tablet 0   No current facility-administered medications on file prior to visit.     Past Surgical History:  Procedure Laterality Date  . ANKLE SURGERY Right   . CARDIAC CATHETERIZATION  yrs ago  . ESOPHAGOGASTRODUODENOSCOPY (EGD) WITH PROPOFOL N/A 01/13/2017   Procedure: ESOPHAGOGASTRODUODENOSCOPY (EGD) WITH PROPOFOL;  Surgeon: Irene Shipper, MD;  Location: [facility];  Service: Endoscopy;  Laterality: N/A;  . HAND SURGERY     Right  . HAND SURGERY     Left carpel tunnel  . THUMB ARTHROSCOPY Left     No Known Allergies  Social History   Socioeconomic History  . Marital status: Married    Spouse name: Not on file  . Number of children: Not on file  . Years of education: Not on file  . Highest education level: Not on file  Occupational History  . Not on file  Social Needs  . Financial resource strain: Not on file  . Food insecurity:    Worry: Not on file    Inability: Not on file  . Transportation needs:    Medical: Not on file    Non-medical: Not on file  Tobacco Use  . Smoking status: Never Smoker  . Smokeless tobacco: Never Used  Substance and Sexual Activity  . Alcohol use: No  . Drug use: No  . Sexual activity: Not on file  Lifestyle  . Physical activity:    Days per week: Not on file    Minutes per session: Not on file  . Stress: Not on file  Relationships  . Social connections:    Talks on phone: Not on file    Gets together: Not on file    Attends religious service: Not on file    Active member of club or organization: Not on file    Attends meetings of clubs or organizations: Not on file    Relationship status: Not on file  . Intimate partner violence:    Fear of current or ex partner: Not on file    Emotionally abused: Not on file    Physically abused: Not on file    Forced sexual activity: Not on file  Other Topics Concern  . Not on file  Social History Narrative   2 years college     Family History  Problem Relation Age  of Onset  . Heart disease Mother   . Heart disease Father   . Colon cancer Neg Hx   . Esophageal cancer  Neg Hx   . Pancreatic cancer Neg Hx   . Stomach cancer Neg Hx     BP (!) 157/80   Pulse 63   Ht 5\' 9"  (1.753 m)   Wt 184 lb (83.5 kg)   BMI 27.17 kg/m   Review of Systems: See HPI above.     Objective:  Physical Exam:  Gen: NAD, comfortable in exam room  Right knee: No gross deformity, ecchymoses, effusion. TTP medial joint line.  No other tenderness. FROM with 5/5 strength.. Negative ant/post drawers. Negative valgus/varus testing. Negative lachmanns. Positive mcmurrays, apleys, thessalys.  Negative patellar apprehension. NV intact distally.  Left knee: No deformity. FROM with 5/5 strength. No tenderness to palpation. NVI distally.   Assessment & Plan:  1. Right knee pain - independently reviewed radiographs and no evidence arthropathy or other bony abnormalities.  Consistent with medial meniscus tear.  He will start with conservative treatment.  Order placed for physical therapy.  Voltaren gel up to 4 times a day.  Icing as needed, Tylenol as needed.  Consider knee brace for support.  Follow-up in 6 weeks.  If not improving will consider an MRI.

## 2018-03-16 ENCOUNTER — Encounter: Payer: Self-pay | Admitting: Medical

## 2018-03-16 ENCOUNTER — Ambulatory Visit (INDEPENDENT_AMBULATORY_CARE_PROVIDER_SITE_OTHER): Payer: Medicare Other | Admitting: Medical

## 2018-03-16 VITALS — BP 130/65 | HR 66 | Temp 98.2°F | Resp 16 | Ht 69.0 in | Wt 183.8 lb

## 2018-03-16 DIAGNOSIS — J40 Bronchitis, not specified as acute or chronic: Secondary | ICD-10-CM | POA: Diagnosis not present

## 2018-03-16 DIAGNOSIS — R05 Cough: Secondary | ICD-10-CM | POA: Diagnosis not present

## 2018-03-16 DIAGNOSIS — J029 Acute pharyngitis, unspecified: Secondary | ICD-10-CM | POA: Diagnosis not present

## 2018-03-16 DIAGNOSIS — R059 Cough, unspecified: Secondary | ICD-10-CM

## 2018-03-16 DIAGNOSIS — R062 Wheezing: Secondary | ICD-10-CM | POA: Diagnosis not present

## 2018-03-16 DIAGNOSIS — J0100 Acute maxillary sinusitis, unspecified: Secondary | ICD-10-CM | POA: Diagnosis not present

## 2018-03-16 LAB — POCT RAPID STREP A (OFFICE): Rapid Strep A Screen: NEGATIVE

## 2018-03-16 MED ORDER — MOMETASONE FURO-FORMOTEROL FUM 200-5 MCG/ACT IN AERO: 2.0000 | INHALATION_SPRAY | Freq: Two times a day (BID) | RESPIRATORY_TRACT | 1 refills | Status: DC

## 2018-03-16 MED ORDER — HYDROCODONE-HOMATROPINE 5-1.5 MG/5ML PO SYRP: 5.0000 mL | ORAL_SOLUTION | Freq: Three times a day (TID) | ORAL | 0 refills | Status: DC | PRN

## 2018-03-16 MED ORDER — ALBUTEROL SULFATE HFA 108 (90 BASE) MCG/ACT IN AERS: 2.0000 | INHALATION_SPRAY | Freq: Four times a day (QID) | RESPIRATORY_TRACT | 2 refills | Status: DC | PRN

## 2018-03-16 MED ORDER — FLUTICASONE PROPIONATE 50 MCG/ACT NA SUSP
2.0000 | Freq: Every day | NASAL | 1 refills | Status: DC
Start: 2018-03-16 — End: 2018-07-26

## 2018-03-16 MED ORDER — AMOXICILLIN-POT CLAVULANATE 875-125 MG PO TABS: 1.0000 | ORAL_TABLET | Freq: Two times a day (BID) | ORAL | 0 refills | Status: DC

## 2018-03-16 MED FILL — HYDROCODONE-HOMATROPINE SOL: 5-1.5 | 7 days supply | Qty: 100 | Fill #0

## 2018-03-16 NOTE — Patient Instructions (Addendum)
You appear to have bronchitis and sinusitis. Rest hydrate and tylenol for fever. I am prescribing cough medicine hycodan, and augmentin antibiotic. For your nasal congestion you could use otc the counter nasal steroid flonase.  For recent wheezing, I will rx dulera and albuterol.  Cxr order placed. If you don't improve please get that.  Follow up in 7-10 days or as needed

## 2018-03-16 NOTE — Progress Notes (Signed)
Subjective:    Patient ID: Matthew Nixon, male    DOB: 06-08-1961, 57 y.o.   MRN: [id number]  HPI  Pt in with st. Hurts to swallow. Pain for three days. Nasal congestion started as well. He is coughing a lot. Some yellow mucus. He can feel a lot of pnd.  Also reports some sinus pressure.  No fever,no chills or sweats.  Pt states he needs refill of dulera. He explains that in past symbicort did not help. Ruthe Mannan seemed to work better per pt.  He state needs refill of albuterol. Some wheezing that persists.   Pt states he has purposefully been exercising in effort to loose. Has lost weight.    Review of Systems  Constitutional: Negative for chills, fatigue and fever.  HENT: Positive for congestion, sinus pressure and sinus pain.   Respiratory: Positive for cough and wheezing. Negative for chest tightness and shortness of breath.   Cardiovascular: Negative for chest pain and palpitations.  Gastrointestinal: Negative for abdominal pain and blood in stool.  Musculoskeletal: Negative for back pain and neck pain.  Skin: Negative for rash.  Neurological: Negative for dizziness, weakness and headaches.  Hematological: Negative for adenopathy. Does not bruise/bleed easily.  Psychiatric/Behavioral: Negative for behavioral problems, self-injury and suicidal ideas. The patient is not nervous/anxious.    Past Medical History:  Diagnosis Date  . Allergy   . Cardiomyopathy ([place])    DCM with EF 32% on echo 2014 and no ischemia on nuclear stress test.  EF improved to 40-45% by echo 02/2017  . Chronic back pain    low back  . Chronic systolic CHF (congestive heart failure), NYHA class 2 ([place]) 08/07/2016  . COPD (chronic obstructive pulmonary disease) ([place])   . Dyspnea    with exertion  . EKG, abnormal    history of left bundle block since 2002 per pt  . GERD (gastroesophageal reflux disease)   . Headache   . Hypertension   . LBBB (left bundle branch block)   . OSA on CPAP 08/07/2016    . Oxygen deficiency   . Pain    right side pain for many years  . PTSD (post-traumatic stress disorder)   . Sleep apnea      Social History   Socioeconomic History  . Marital status: Married    Spouse name: Not on file  . Number of children: Not on file  . Years of education: Not on file  . Highest education level: Not on file  Occupational History  . Not on file  Social Needs  . Financial resource strain: Not on file  . Food insecurity:    Worry: Not on file    Inability: Not on file  . Transportation needs:    Medical: Not on file    Non-medical: Not on file  Tobacco Use  . Smoking status: Never Smoker  . Smokeless tobacco: Never Used  Substance and Sexual Activity  . Alcohol use: No  . Drug use: No  . Sexual activity: Not on file  Lifestyle  . Physical activity:    Days per week: Not on file    Minutes per session: Not on file  . Stress: Not on file  Relationships  . Social connections:    Talks on phone: Not on file    Gets together: Not on file    Attends religious service: Not on file    Active member of club or organization: Not on file    Attends meetings of  clubs or organizations: Not on file    Relationship status: Not on file  . Intimate partner violence:    Fear of current or ex partner: Not on file    Emotionally abused: Not on file    Physically abused: Not on file    Forced sexual activity: Not on file  Other Topics Concern  . Not on file  Social History Narrative   2 years college     Past Surgical History:  Procedure Laterality Date  . ANKLE SURGERY Right   . CARDIAC CATHETERIZATION  yrs ago  . ESOPHAGOGASTRODUODENOSCOPY (EGD) WITH PROPOFOL N/A 01/13/2017   Procedure: ESOPHAGOGASTRODUODENOSCOPY (EGD) WITH PROPOFOL;  Surgeon: Irene Shipper, MD;  Location: [facility];  Service: Endoscopy;  Laterality: N/A;  . HAND SURGERY     Right  . HAND SURGERY     Left carpel tunnel  . THUMB ARTHROSCOPY Left     Family History  Problem  Relation Age of Onset  . Heart disease Mother   . Heart disease Father   . Colon cancer Neg Hx   . Esophageal cancer Neg Hx   . Pancreatic cancer Neg Hx   . Stomach cancer Neg Hx     No Known Allergies  Current Outpatient Medications on File Prior to Visit  Medication Sig Dispense Refill  . albuterol (PROVENTIL HFA;VENTOLIN HFA) 108 (90 Base) MCG/ACT inhaler Inhale 2 puffs into the lungs every 6 (six) hours as needed for wheezing or shortness of breath. 1 Inhaler 0  . amLODipine (NORVASC) 10 MG tablet Take 10 mg by mouth daily.    [name] aspirin 81 MG tablet Take 81 mg by mouth daily.    . budesonide-formoterol (SYMBICORT) 80-4.5 MCG/ACT inhaler Inhale 2 puffs into the lungs 2 (two) times daily. 1 Inhaler 3  . carvedilol (COREG) 25 MG tablet Take 25 mg by mouth 2 (two) times daily with a meal.    . cetirizine (ZYRTEC) 10 MG tablet Take 10 mg by mouth daily.    . cyclobenzaprine (FLEXERIL) 10 MG tablet Take 1 tablet (10 mg total) by mouth at bedtime. 14 tablet 0  . diclofenac sodium (VOLTAREN) 1 % GEL Apply 4 g topically 4 (four) times daily. 5 Tube 1  . digoxin (LANOXIN) 0.125 MG tablet Take 0.125 mg by mouth daily.     [name] doxazosin (CARDURA) 8 MG tablet Take 8 mg by mouth daily.    [name] doxycycline (VIBRA-TABS) 100 MG tablet Take 1 tablet (100 mg total) by mouth 2 (two) times daily. Can gives caps or generic 20 tablet 0  . fluticasone (FLONASE) 50 MCG/ACT nasal spray Place 2 sprays into both nostrils daily. 16 g 1  . furosemide (LASIX) 20 MG tablet Take 1 tablet (20 mg total) by mouth daily. 14 tablet 0  . hydrALAZINE (APRESOLINE) 100 MG tablet Take 0.5 tablets (50 mg total) by mouth 3 (three) times daily. 90 tablet 11  . hydrOXYzine (ATARAX/VISTARIL) 10 MG tablet 1-2 tab po q 8 hours as needed itching. 30 tablet 0  . ipratropium (ATROVENT HFA) 17 MCG/ACT inhaler Inhale 2 puffs into the lungs every 6 (six) hours as needed for wheezing. 1 Inhaler 3  . irbesartan (AVAPRO) 300 MG tablet Take 1  tablet (300 mg total) by mouth daily. 30 tablet 11  . ISOSORBIDE MONONITRATE PO Take 60 mg by mouth 2 (two) times daily.    . meclizine (ANTIVERT) 12.5 MG tablet Take 1 tablet (12.5 mg total) by mouth 3 (three) times daily as  needed for dizziness. 30 tablet 0  . montelukast (SINGULAIR) 10 MG tablet Take 1 tablet (10 mg total) by mouth at bedtime. 30 tablet 3  . omeprazole (PRILOSEC) 40 MG capsule Take 1 capsule (40 mg total) by mouth 2 (two) times daily. 180 capsule 3  . ondansetron (ZOFRAN ODT) 8 MG disintegrating tablet Take 1 tablet (8 mg total) by mouth every 8 (eight) hours as needed for nausea or vomiting. 20 tablet 0  . ranitidine (ZANTAC) 150 MG capsule Take 1 capsule (150 mg total) by mouth 2 (two) times daily. 60 capsule 0  . spironolactone (ALDACTONE) 25 MG tablet Take 25 mg by mouth daily.    [name] tobramycin (TOBREX) 0.3 % ophthalmic solution Place 2 drops into the left eye every 6 (six) hours. 5 mL 0  . traMADol (ULTRAM) 50 MG tablet 1-2 every 4 hours as needed for cough or pain 40 tablet 0   No current facility-administered medications on file prior to visit.     BP 130/65   Pulse 66   Temp 98.2 F (36.8 C) (Oral)   Resp 16   Ht 5\' 9"  (1.753 m)   Wt 183 lb 12.8 oz (83.4 kg)   SpO2 99%   BMI 27.14 kg/m       Objective:   Physical Exam  General  Mental Status - Alert. General Appearance - Well groomed. Not in acute distress.  Skin Rashes- No Rashes.  HEENT Head- Normal. Ear Auditory Canal - Left- Normal. Right - Normal.Tympanic Membrane- Left- Normal. Right- Normal. Eye Sclera/Conjunctiva- Left- Normal. Right- Normal. Nose & Sinuses Nasal Mucosa- Left-  Boggy and Congested. Right-  Boggy and  Congested.Bilateral maxillary and frontal sinus pressure. Mouth & Throat Lips: Upper Lip- Normal: no dryness, cracking, pallor, cyanosis, or vesicular eruption. Lower Lip-Normal: no dryness, cracking, pallor, cyanosis or vesicular eruption. Buccal Mucosa- Bilateral- No  Aphthous ulcers. Oropharynx- No Discharge or Erythema. Tonsils: Characteristics- Bilateral- No Erythema. Size/Enlargement- Bilateral- No enlargement. Discharge- bilateral-None.  Neck Neck- Supple. No Masses.   Chest and Lung Exam Auscultation: Breath Sounds:-Clear even and unlabored.  Cardiovascular Auscultation:Rythm- Regular, rate and rhythm. Murmurs & Other Heart Sounds:Ausculatation of the heart reveal- No Murmurs.  Lymphatic Head & Neck General Head & Neck Lymphatics: Bilateral: Description- No Localized lymphadenopathy.       Assessment & Plan:  You appear to have bronchitis and sinusitis. Rest hydrate and tylenol for fever. I am prescribing cough medicine hycodan, and augmentin antibiotic. For your nasal congestion you could use otc the counter nasal steroid flonase.  For recent wheezing, I will rx dulera and albuterol.  Cxr order placed. If you don't improve please get that.  Follow up in 7-10 days or as needed  [facility] [place] [facility]   Mackie Pai, PA-C

## 2018-03-23 ENCOUNTER — Ambulatory Visit: Payer: PRIVATE HEALTH INSURANCE | Admitting: Family Medicine

## 2018-03-30 ENCOUNTER — Encounter: Payer: Self-pay | Admitting: Internal Medicine

## 2018-03-30 ENCOUNTER — Ambulatory Visit (INDEPENDENT_AMBULATORY_CARE_PROVIDER_SITE_OTHER): Payer: Commercial Managed Care - PPO | Admitting: Internal Medicine

## 2018-03-30 VITALS — BP 132/74 | HR 80 | Ht 69.0 in | Wt 180.0 lb

## 2018-03-30 DIAGNOSIS — K219 Gastro-esophageal reflux disease without esophagitis: Secondary | ICD-10-CM | POA: Diagnosis not present

## 2018-03-30 DIAGNOSIS — Z1211 Encounter for screening for malignant neoplasm of colon: Secondary | ICD-10-CM | POA: Diagnosis not present

## 2018-03-30 DIAGNOSIS — R1011 Right upper quadrant pain: Secondary | ICD-10-CM

## 2018-03-30 MED ORDER — RANITIDINE HCL 150 MG PO CAPS: 150.0000 mg | ORAL_CAPSULE | Freq: Two times a day (BID) | ORAL | 6 refills | Status: DC

## 2018-03-30 NOTE — Progress Notes (Signed)
HISTORY OF PRESENT ILLNESS:  Matthew Nixon is a 57 y.o. 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  Initial evaluation April 2018.  Last evaluation June 2018.  See that dictation.  Extensive work-up of his right upper quadrant pain yields a diagnosis most consistent with musculoskeletal.  He had sludge on ultrasound on one occasion.  More recent ultrasound was negative.  Borderline HIDA scan.  Pain does not sound biliary.  Negative upper endoscopy.  Since his last visit he tells me that he has been successful with weight loss secondary to regular exercise and improved diet.  He has lost approximately 20 pounds since his last visit.  He is active.  For his reflux disease he is on PPI during the day and H2 receptor antagonist at night.  He does request refill of his ranitidine.  He continues to have intermittent problems with right upper quadrant pain, but no worse.  Characteristics are unchanged.  Review of blood work from August 2019 shows normal comprehensive metabolic panel including liver test.  Unremarkable CBC with hemoglobin 17.2.  Last ultrasound January 28, 2018 was negative except for possible nonobstructive right nephrolithiasis.  He is known to have diverticulosis.  His last colon cancer screening method was with Cologuard.  This was performed July 2018 and returned negative  REVIEW OF SYSTEMS:  All non-GI ROS negative unless otherwise stated in the HPI except for back pain  Past Medical History:  Diagnosis Date  . Allergy   . Cardiomyopathy ([place])    DCM with EF 32% on echo 2014 and no ischemia on nuclear stress test.  EF improved to 40-45% by echo 02/2017  . Chronic back pain    low back  . Chronic systolic CHF (congestive heart failure), NYHA class 2 ([place]) 08/07/2016  . COPD (chronic obstructive pulmonary disease)  ([place])   . Dyspnea    with exertion  . EKG, abnormal    history of left bundle block since 2002 per pt  . GERD (gastroesophageal reflux disease)   . Headache   . Hypertension   . LBBB (left bundle branch block)   . OSA on CPAP 08/07/2016  . Oxygen deficiency   . Pain    right side pain for many years  . PTSD (post-traumatic stress disorder)   . Sleep apnea     Past Surgical History:  Procedure Laterality Date  . ANKLE SURGERY Right   . CARDIAC CATHETERIZATION  yrs ago  . ESOPHAGOGASTRODUODENOSCOPY (EGD) WITH PROPOFOL N/A 01/13/2017   Procedure: ESOPHAGOGASTRODUODENOSCOPY (EGD) WITH PROPOFOL;  Surgeon: Irene Shipper, MD;  Location: [facility];  Service: Endoscopy;  Laterality: N/A;  . HAND SURGERY     Right  . HAND SURGERY     Left carpel tunnel  . THUMB ARTHROSCOPY Left     Social History Matthew Nixon  reports that he has never smoked. He has never used smokeless tobacco. He reports that he does not drink alcohol or use drugs.  family history includes Heart disease in his father and mother.  No Known Allergies     PHYSICAL EXAMINATION: Vital signs: BP 132/74   Pulse 80   Ht 5\' 9"  (1.753 m)   Wt 180 lb (81.6 kg)   BMI 26.58 kg/m   Constitutional: generally well-appearing, no acute distress Psychiatric: alert and oriented x3, cooperative Eyes: extraocular movements intact,  anicteric, conjunctiva pink Mouth: oral pharynx moist, no lesions Neck: supple no lymphadenopathy Cardiovascular: heart regular rate and rhythm, no murmur Lungs: clear to auscultation bilaterally Abdomen: soft, nontender, nondistended, no obvious ascites, no peritoneal signs, normal bowel sounds, no organomegaly Rectal: Omitted Extremities: no clubbing, cyanosis, or lower extremity edema bilaterally Skin: no lesions on visible extremities Neuro: No focal deficits.  Cranial nerves intact  ASSESSMENT:  1.  GERD.  Classic symptoms controlled with acid reflux regimen.  Much better with  weight reduction 2.  Chronic intermittent right upper quadrant pain consistent with musculoskeletal etiology 3.  Negative Cologuard July 2018  PLAN:  1.  Continue acid suppressive regimen 2.  Reassurance regarding right-sided pain 3.  If he is going to continue with cologard strategy, then repeat around July 2021 4.  GI follow-up as needed

## 2018-03-30 NOTE — Patient Instructions (Signed)
We have sent the following medications to your pharmacy for you to pick up at your convenience:  Zantac

## 2018-06-03 ENCOUNTER — Ambulatory Visit (HOSPITAL_BASED_OUTPATIENT_CLINIC_OR_DEPARTMENT_OTHER)
Admission: RE | Admit: 2018-06-03 | Discharge: 2018-06-03 | Disposition: A | Discharge status: Home / Self Care | Payer: Medicare Other | Source: Ambulatory Visit | Attending: Medical | Admitting: Medical

## 2018-06-03 ENCOUNTER — Encounter: Payer: Self-pay | Admitting: Medical

## 2018-06-03 ENCOUNTER — Ambulatory Visit (INDEPENDENT_AMBULATORY_CARE_PROVIDER_SITE_OTHER): Payer: Medicare Other | Admitting: Medical

## 2018-06-03 VITALS — BP 138/78 | HR 61 | Temp 98.6°F | Resp 16 | Ht 69.0 in | Wt 187.0 lb

## 2018-06-03 DIAGNOSIS — M79601 Pain in right arm: Secondary | ICD-10-CM | POA: Diagnosis not present

## 2018-06-03 DIAGNOSIS — M79621 Pain in right upper arm: Secondary | ICD-10-CM | POA: Diagnosis not present

## 2018-06-03 NOTE — Patient Instructions (Signed)
For rt arm pain will get [place] of rt upper ext. Can apply warm compress to vein areas twice daily. Can use tylenol for pain.  If working out makes pain worse then stop.  If [place] negative then will refer and get sports med opinion on bicep tendon area.  Follow up as needed

## 2018-06-03 NOTE — Progress Notes (Signed)
Subjective:    Patient ID: Matthew Nixon, male    DOB: 1960/10/15, 57 y.o.   MRN: [id number]  HPI  Pt in with some rt arm region.   Pt states he has pain in rt arm prior IV site. He states last May he got the iv placed in the ED. He remembers at that time iv site looked swollen and they removed the iv and restarted. He states pain has been on and off intermittent since then. He states pain feels like it is getting worse.   Sometimes pain severe even when just brushing teeth. Feels like arm gets tuck in flexion at times.  Pt does work out doing various exercise. No pain when exercising. Notes more pain when not exercising.  Pain over vein area/arm he states can be 9/10.     Review of Systems  Constitutional: Negative for chills, fatigue and fever.  Respiratory: Negative for cough, chest tightness, shortness of breath and wheezing.   Cardiovascular: Negative for chest pain and palpitations.  Gastrointestinal: Negative for abdominal distention, abdominal pain, constipation, diarrhea and nausea.  Musculoskeletal: Negative for back pain.       Rt arm pain. See hpi.  Skin: Negative for color change and rash.  Hematological: Negative for adenopathy. Does not bruise/bleed easily.  Psychiatric/Behavioral: Negative for behavioral problems, confusion, self-injury and suicidal ideas.   Past Medical History:  Diagnosis Date  . Allergy   . Cardiomyopathy ([place])    DCM with EF 32% on echo 2014 and no ischemia on nuclear stress test.  EF improved to 40-45% by echo 02/2017  . Chronic back pain    low back  . Chronic systolic CHF (congestive heart failure), NYHA class 2 ([place]) 08/07/2016  . COPD (chronic obstructive pulmonary disease) ([place])   . Dyspnea    with exertion  . EKG, abnormal    history of left bundle block since 2002 per pt  . GERD (gastroesophageal reflux disease)   . Headache   . Hypertension   . LBBB (left bundle branch block)   . OSA on CPAP 08/07/2016  . Oxygen deficiency    . Pain    right side pain for many years  . PTSD (post-traumatic stress disorder)   . Sleep apnea      Social History   Socioeconomic History  . Marital status: Married    Spouse name: Not on file  . Number of children: Not on file  . Years of education: Not on file  . Highest education level: Not on file  Occupational History  . Not on file  Social Needs  . Financial resource strain: Not on file  . Food insecurity:    Worry: Not on file    Inability: Not on file  . Transportation needs:    Medical: Not on file    Non-medical: Not on file  Tobacco Use  . Smoking status: Never Smoker  . Smokeless tobacco: Never Used  Substance and Sexual Activity  . Alcohol use: No  . Drug use: No  . Sexual activity: Not on file  Lifestyle  . Physical activity:    Days per week: Not on file    Minutes per session: Not on file  . Stress: Not on file  Relationships  . Social connections:    Talks on phone: Not on file    Gets together: Not on file    Attends religious service: Not on file    Active member of club or organization: Not on  file    Attends meetings of clubs or organizations: Not on file    Relationship status: Not on file  . Intimate partner violence:    Fear of current or ex partner: Not on file    Emotionally abused: Not on file    Physically abused: Not on file    Forced sexual activity: Not on file  Other Topics Concern  . Not on file  Social History Narrative   2 years college     Past Surgical History:  Procedure Laterality Date  . ANKLE SURGERY Right   . CARDIAC CATHETERIZATION  yrs ago  . ESOPHAGOGASTRODUODENOSCOPY (EGD) WITH PROPOFOL N/A 01/13/2017   Procedure: ESOPHAGOGASTRODUODENOSCOPY (EGD) WITH PROPOFOL;  Surgeon: Irene Shipper, MD;  Location: [facility];  Service: Endoscopy;  Laterality: N/A;  . HAND SURGERY     Right  . HAND SURGERY     Left carpel tunnel  . THUMB ARTHROSCOPY Left     Family History  Problem Relation Age of Onset  .  Heart disease Mother   . Heart disease Father   . Colon cancer Neg Hx   . Esophageal cancer Neg Hx   . Pancreatic cancer Neg Hx   . Stomach cancer Neg Hx     No Known Allergies  Current Outpatient Medications on File Prior to Visit  Medication Sig Dispense Refill  . albuterol (PROVENTIL HFA;VENTOLIN HFA) 108 (90 Base) MCG/ACT inhaler Inhale 2 puffs into the lungs every 6 (six) hours as needed for wheezing or shortness of breath. 1 Inhaler 0  . albuterol (PROVENTIL HFA;VENTOLIN HFA) 108 (90 Base) MCG/ACT inhaler Inhale 2 puffs into the lungs every 6 (six) hours as needed for wheezing or shortness of breath. 1 Inhaler 2  . amLODipine (NORVASC) 10 MG tablet Take 10 mg by mouth daily.    [name] aspirin 81 MG tablet Take 81 mg by mouth daily.    . budesonide-formoterol (SYMBICORT) 80-4.5 MCG/ACT inhaler Inhale 2 puffs into the lungs 2 (two) times daily. 1 Inhaler 3  . carvedilol (COREG) 25 MG tablet Take 25 mg by mouth 2 (two) times daily with a meal.    . cetirizine (ZYRTEC) 10 MG tablet Take 10 mg by mouth daily.    . cyclobenzaprine (FLEXERIL) 10 MG tablet Take 1 tablet (10 mg total) by mouth at bedtime. 14 tablet 0  . diclofenac sodium (VOLTAREN) 1 % GEL Apply 4 g topically 4 (four) times daily. 5 Tube 1  . digoxin (LANOXIN) 0.125 MG tablet Take 0.125 mg by mouth daily.     [name] doxazosin (CARDURA) 8 MG tablet Take 8 mg by mouth daily.    . fluticasone (FLONASE) 50 MCG/ACT nasal spray Place 2 sprays into both nostrils daily. 16 g 1  . fluticasone (FLONASE) 50 MCG/ACT nasal spray Place 2 sprays into both nostrils daily. 16 g 1  . furosemide (LASIX) 20 MG tablet Take 1 tablet (20 mg total) by mouth daily. 14 tablet 0  . hydrALAZINE (APRESOLINE) 100 MG tablet Take 0.5 tablets (50 mg total) by mouth 3 (three) times daily. 90 tablet 11  . HYDROcodone-homatropine (HYCODAN) 5-1.5 MG/5ML syrup Take 5 mLs by mouth every 8 (eight) hours as needed. 100 mL 0  . hydrOXYzine (ATARAX/VISTARIL) 10 MG tablet 1-2  tab po q 8 hours as needed itching. 30 tablet 0  . ipratropium (ATROVENT HFA) 17 MCG/ACT inhaler Inhale 2 puffs into the lungs every 6 (six) hours as needed for wheezing. 1 Inhaler 3  . irbesartan (AVAPRO)  300 MG tablet Take 1 tablet (300 mg total) by mouth daily. 30 tablet 11  . ISOSORBIDE MONONITRATE PO Take 60 mg by mouth 2 (two) times daily.    . meclizine (ANTIVERT) 12.5 MG tablet Take 1 tablet (12.5 mg total) by mouth 3 (three) times daily as needed for dizziness. 30 tablet 0  . mometasone-formoterol (DULERA) 200-5 MCG/ACT AERO Inhale 2 puffs into the lungs 2 (two) times daily. 13 g 1  . montelukast (SINGULAIR) 10 MG tablet Take 1 tablet (10 mg total) by mouth at bedtime. 30 tablet 3  . omeprazole (PRILOSEC) 40 MG capsule Take 1 capsule (40 mg total) by mouth 2 (two) times daily. 180 capsule 3  . ondansetron (ZOFRAN ODT) 8 MG disintegrating tablet Take 1 tablet (8 mg total) by mouth every 8 (eight) hours as needed for nausea or vomiting. 20 tablet 0  . ranitidine (ZANTAC) 150 MG capsule Take 1 capsule (150 mg total) by mouth 2 (two) times daily. 60 capsule 6  . spironolactone (ALDACTONE) 25 MG tablet Take 25 mg by mouth daily.    [name] tobramycin (TOBREX) 0.3 % ophthalmic solution Place 2 drops into the left eye every 6 (six) hours. 5 mL 0  . traMADol (ULTRAM) 50 MG tablet 1-2 every 4 hours as needed for cough or pain 40 tablet 0   No current facility-administered medications on file prior to visit.     BP (!) 158/74   Pulse 61   Temp 98.6 F (37 C) (Oral)   Resp 16   Ht 5\' 9"  (1.753 m)   Wt 187 lb (84.8 kg)   SpO2 99%   BMI 27.62 kg/m       Objective:   Physical Exam  General- No acute distress. Pleasant patient. Neck- Full range of motion, no jvd Lungs- Clear, even and unlabored. Heart- regular rate and rhythm. Neurologic- CNII- XII grossly intact.  Rt upper ext- lateral bicep. Prominent vein but does not feel hard. But it is tender. No warmth on palpation. Proximal forearm  pain as well.  Also on deep palpation towards the biceps tendon area just above elbow has area of pain.      Assessment & Plan:  For rt arm pain will get [place] of rt upper ext. Can apply warm compress to vein areas twice daily. Can use tylenol for pain.  If working out makes pain worse then stop.  If [place] negative then will refer and get sports med opinion on bicep tendon area.  Follow up as needed  Mackie Pai, PA-C

## 2018-06-08 ENCOUNTER — Telehealth: Payer: Self-pay | Admitting: Medical

## 2018-06-08 DIAGNOSIS — M79603 Pain in arm, unspecified: Secondary | ICD-10-CM

## 2018-06-08 NOTE — Telephone Encounter (Signed)
Copied from [place] [phone]. Topic: Quick Communication - See Telephone Encounter >> Jun 08, 2018  4:59 PM Rutherford Nail, NT wrote: CRM for notification. See Telephone encounter for: 06/08/18. Patient calling and would like a phone call back regarding his ultrasound results from 06/04/18. Please advise.

## 2018-06-10 NOTE — Telephone Encounter (Signed)
Notes recorded by Mackie Pai, PA-C on 06/04/2018 at 8:41 AM EST No evidence of deep vein thrombosis. Regarding superficial veins they did not comment specifically on superficial veins. But also Wynetta Emery reports some very superficial veins have thrombosis they will make comment to that effect. However I would still do warm compresses to the vein area and take Tylenol to see if the pain decreases. If pain does not subside and want to refer to sports medicine to evaluate of the bicep tendon area. Please give me an update by Tuesday or Wednesday next week on how the area feels with warm compresses treatment and Tylenol    Informed Pt of above- he is still having pain. Will place sports medicine referral.

## 2018-06-11 ENCOUNTER — Encounter: Payer: Self-pay | Admitting: Family Medicine

## 2018-06-11 ENCOUNTER — Ambulatory Visit (INDEPENDENT_AMBULATORY_CARE_PROVIDER_SITE_OTHER): Payer: Medicare Other | Admitting: Family Medicine

## 2018-06-11 VITALS — BP 153/72 | HR 61 | Ht 69.0 in | Wt 184.0 lb

## 2018-06-11 DIAGNOSIS — M79601 Pain in right arm: Secondary | ICD-10-CM

## 2018-06-11 MED ORDER — DICLOFENAC SODIUM 1 % TD GEL: 2.0000 g | Freq: Four times a day (QID) | TRANSDERMAL | 2 refills | Status: DC

## 2018-06-11 NOTE — Patient Instructions (Signed)
Try voltaren gel up to 4 times a day topically. Vitamin B6 50mg  daily can help with nerve healing - this is likely nerve irritation. Physical therapy is an option for the modalities there (stim, ultrasound, iontophoresis with a steroid). These can take several months to heal but are not dangerous. Follow up with me as needed.

## 2018-06-14 ENCOUNTER — Encounter

## 2018-06-17 ENCOUNTER — Encounter: Payer: Self-pay | Admitting: Family Medicine

## 2018-06-17 NOTE — Progress Notes (Signed)
PCP and consultation requested by: Mackie Pai, PA-C  Subjective:   HPI: Patient is a 57 y.o. male here for right arm pain.  Patient reports he's had about 2 months of superficial right upper arm pain. States pain started following an IV and no other injuries. No swelling, redness, warmth to the area. Pain up to 9/10 with light touch of the area, sharp. Doesn't affect his ability to exercise at all, good strength. Has tried TENS unit and ben gay with minimal relief. No numbness, skin changes.  Past Medical History:  Diagnosis Date  . Allergy   . Cardiomyopathy ([place])    DCM with EF 32% on echo 2014 and no ischemia on nuclear stress test.  EF improved to 40-45% by echo 02/2017  . Chronic back pain    low back  . Chronic systolic CHF (congestive heart failure), NYHA class 2 ([place]) 08/07/2016  . COPD (chronic obstructive pulmonary disease) ([place])   . Dyspnea    with exertion  . EKG, abnormal    history of left bundle block since 2002 per pt  . GERD (gastroesophageal reflux disease)   . Headache   . Hypertension   . LBBB (left bundle branch block)   . OSA on CPAP 08/07/2016  . Oxygen deficiency   . Pain    right side pain for many years  . PTSD (post-traumatic stress disorder)   . Sleep apnea     Current Outpatient Medications on File Prior to Visit  Medication Sig Dispense Refill  . albuterol (PROVENTIL HFA;VENTOLIN HFA) 108 (90 Base) MCG/ACT inhaler Inhale 2 puffs into the lungs every 6 (six) hours as needed for wheezing or shortness of breath. 1 Inhaler 0  . albuterol (PROVENTIL HFA;VENTOLIN HFA) 108 (90 Base) MCG/ACT inhaler Inhale 2 puffs into the lungs every 6 (six) hours as needed for wheezing or shortness of breath. 1 Inhaler 2  . amLODipine (NORVASC) 10 MG tablet Take 10 mg by mouth daily.    [name] aspirin 81 MG tablet Take 81 mg by mouth daily.    . budesonide-formoterol (SYMBICORT) 80-4.5 MCG/ACT inhaler Inhale 2 puffs into the lungs 2 (two) times daily. 1 Inhaler 3  .  carvedilol (COREG) 25 MG tablet Take 25 mg by mouth 2 (two) times daily with a meal.    . cetirizine (ZYRTEC) 10 MG tablet Take 10 mg by mouth daily.    . cyclobenzaprine (FLEXERIL) 10 MG tablet Take 1 tablet (10 mg total) by mouth at bedtime. 14 tablet 0  . digoxin (LANOXIN) 0.125 MG tablet Take 0.125 mg by mouth daily.     [name] doxazosin (CARDURA) 8 MG tablet Take 8 mg by mouth daily.    . fluticasone (FLONASE) 50 MCG/ACT nasal spray Place 2 sprays into both nostrils daily. 16 g 1  . fluticasone (FLONASE) 50 MCG/ACT nasal spray Place 2 sprays into both nostrils daily. 16 g 1  . furosemide (LASIX) 20 MG tablet Take 1 tablet (20 mg total) by mouth daily. 14 tablet 0  . hydrALAZINE (APRESOLINE) 100 MG tablet Take 0.5 tablets (50 mg total) by mouth 3 (three) times daily. 90 tablet 11  . HYDROcodone-homatropine (HYCODAN) 5-1.5 MG/5ML syrup Take 5 mLs by mouth every 8 (eight) hours as needed. 100 mL 0  . hydrOXYzine (ATARAX/VISTARIL) 10 MG tablet 1-2 tab po q 8 hours as needed itching. 30 tablet 0  . ipratropium (ATROVENT HFA) 17 MCG/ACT inhaler Inhale 2 puffs into the lungs every 6 (six) hours as needed for wheezing.  1 Inhaler 3  . irbesartan (AVAPRO) 300 MG tablet Take 1 tablet (300 mg total) by mouth daily. 30 tablet 11  . ISOSORBIDE MONONITRATE PO Take 60 mg by mouth 2 (two) times daily.    . meclizine (ANTIVERT) 12.5 MG tablet Take 1 tablet (12.5 mg total) by mouth 3 (three) times daily as needed for dizziness. 30 tablet 0  . mometasone-formoterol (DULERA) 200-5 MCG/ACT AERO Inhale 2 puffs into the lungs 2 (two) times daily. 13 g 1  . montelukast (SINGULAIR) 10 MG tablet Take 1 tablet (10 mg total) by mouth at bedtime. 30 tablet 3  . omeprazole (PRILOSEC) 40 MG capsule Take 1 capsule (40 mg total) by mouth 2 (two) times daily. 180 capsule 3  . ondansetron (ZOFRAN ODT) 8 MG disintegrating tablet Take 1 tablet (8 mg total) by mouth every 8 (eight) hours as needed for nausea or vomiting. 20 tablet 0  .  ranitidine (ZANTAC) 150 MG capsule Take 1 capsule (150 mg total) by mouth 2 (two) times daily. 60 capsule 6  . spironolactone (ALDACTONE) 25 MG tablet Take 25 mg by mouth daily.    [name] tobramycin (TOBREX) 0.3 % ophthalmic solution Place 2 drops into the left eye every 6 (six) hours. 5 mL 0  . traMADol (ULTRAM) 50 MG tablet 1-2 every 4 hours as needed for cough or pain 40 tablet 0   No current facility-administered medications on file prior to visit.     Past Surgical History:  Procedure Laterality Date  . ANKLE SURGERY Right   . CARDIAC CATHETERIZATION  yrs ago  . ESOPHAGOGASTRODUODENOSCOPY (EGD) WITH PROPOFOL N/A 01/13/2017   Procedure: ESOPHAGOGASTRODUODENOSCOPY (EGD) WITH PROPOFOL;  Surgeon: Irene Shipper, MD;  Location: [facility];  Service: Endoscopy;  Laterality: N/A;  . HAND SURGERY     Right  . HAND SURGERY     Left carpel tunnel  . THUMB ARTHROSCOPY Left     No Known Allergies  Social History   Socioeconomic History  . Marital status: Married    Spouse name: Not on file  . Number of children: Not on file  . Years of education: Not on file  . Highest education level: Not on file  Occupational History  . Not on file  Social Needs  . Financial resource strain: Not on file  . Food insecurity:    Worry: Not on file    Inability: Not on file  . Transportation needs:    Medical: Not on file    Non-medical: Not on file  Tobacco Use  . Smoking status: Never Smoker  . Smokeless tobacco: Never Used  Substance and Sexual Activity  . Alcohol use: No  . Drug use: No  . Sexual activity: Not on file  Lifestyle  . Physical activity:    Days per week: Not on file    Minutes per session: Not on file  . Stress: Not on file  Relationships  . Social connections:    Talks on phone: Not on file    Gets together: Not on file    Attends religious service: Not on file    Active member of club or organization: Not on file    Attends meetings of clubs or organizations: Not on  file    Relationship status: Not on file  . Intimate partner violence:    Fear of current or ex partner: Not on file    Emotionally abused: Not on file    Physically abused: Not on file  Forced sexual activity: Not on file  Other Topics Concern  . Not on file  Social History Narrative   2 years college     Family History  Problem Relation Age of Onset  . Heart disease Mother   . Heart disease Father   . Colon cancer Neg Hx   . Esophageal cancer Neg Hx   . Pancreatic cancer Neg Hx   . Stomach cancer Neg Hx     BP (!) 153/72   Pulse 61   Ht 5\' 9"  (1.753 m)   Wt 184 lb (83.5 kg)   BMI 27.17 kg/m   Review of Systems: See HPI above.     Objective:  Physical Exam:  Gen: NAD, comfortable in exam room  Right shoulder/upper arm: No swelling, ecchymoses.  No gross deformity. TTP over superficial vein upper arm.  No TTP of bicep, no pain with bicep squeeze. FROM with 5/5 strength including elbow flexion and extension without pain. Strength 5/5 with empty can and resisted internal/external rotation. Negative hook of biceps tendon NV intact distally.  Left upper arm: No deformity. FROM with 5/5 strength. No tenderness to palpation. NVI distally.  MSK u/s right upper arm:  No visible thrombus in superficial venous structures.  Biceps muscle without irregularities.   Assessment & Plan:  1. Right upper arm pain - no evidence superficial venous thrombus at this time and had doppler not showing DVT.  No abnormalities of deep muscle/tendinous structures and has been able to work out without problems.  Likely his pain is from IV needle hitting branch of superficial nerve and we discussed this can take months to resolve.  Voltaren gel, tylenol, vitamin B6.  Consider modalities in physical therapy that might help.  F/u prn otherwise.

## 2018-06-21 DIAGNOSIS — I10 Essential (primary) hypertension: Secondary | ICD-10-CM | POA: Diagnosis not present

## 2018-06-21 DIAGNOSIS — I5022 Chronic systolic (congestive) heart failure: Secondary | ICD-10-CM | POA: Diagnosis not present

## 2018-06-21 DIAGNOSIS — I340 Nonrheumatic mitral (valve) insufficiency: Secondary | ICD-10-CM | POA: Diagnosis not present

## 2018-07-26 ENCOUNTER — Encounter: Payer: Self-pay | Admitting: Family Medicine

## 2018-07-26 ENCOUNTER — Ambulatory Visit (INDEPENDENT_AMBULATORY_CARE_PROVIDER_SITE_OTHER): Payer: Medicare Other | Admitting: Family Medicine

## 2018-07-26 VITALS — BP 149/76 | HR 66 | Ht 69.0 in | Wt 183.0 lb

## 2018-07-26 DIAGNOSIS — M79601 Pain in right arm: Secondary | ICD-10-CM | POA: Diagnosis not present

## 2018-07-26 NOTE — Progress Notes (Signed)
PCP and consultation requested by: Mackie Pai, PA-C  Subjective:   HPI: Patient is a 58 y.o. male here for right arm pain.  06/11/18:  Patient reports he's had about 2 months of superficial right upper arm pain. States pain started following an IV and no other injuries. No swelling, redness, warmth to the area. Pain up to 9/10 with light touch of the area, sharp. Doesn't affect his ability to exercise at all, good strength. Has tried TENS unit and ben gay with minimal relief. No numbness, skin changes.  07/26/18: Patient returns today for right arm pain.  He reports continued 9/10 pain in the anterior forearm and antecubital fossa.  He was previously prescribed Voltaren gel which he states has not been helpful.  He denies any new swelling, erythema or bruising.  He does note some improvement regarding pain to light touch.  It is not as sensitive as it was previously.  His pain is made worse during extension at the elbow.  He is able to flex the elbow without pain.  He has continued to exercise at the gym, including upper body exercises without issues.  He denies any neck pain.  No distal numbness or tingling.  No skin changes.  Past Medical History:  Diagnosis Date  . Allergy   . Cardiomyopathy ([place])    DCM with EF 32% on echo 2014 and no ischemia on nuclear stress test.  EF improved to 40-45% by echo 02/2017  . Chronic back pain    low back  . Chronic systolic CHF (congestive heart failure), NYHA class 2 ([place]) 08/07/2016  . COPD (chronic obstructive pulmonary disease) ([place])   . Dyspnea    with exertion  . EKG, abnormal    history of left bundle block since 2002 per pt  . GERD (gastroesophageal reflux disease)   . Headache   . Hypertension   . LBBB (left bundle branch block)   . OSA on CPAP 08/07/2016  . Oxygen deficiency   . Pain    right side pain for many years  . PTSD (post-traumatic stress disorder)   . Sleep apnea     Current Outpatient Medications on File Prior to  Visit  Medication Sig Dispense Refill  . albuterol (PROVENTIL HFA;VENTOLIN HFA) 108 (90 Base) MCG/ACT inhaler Inhale 2 puffs into the lungs every 6 (six) hours as needed for wheezing or shortness of breath. 1 Inhaler 0  . albuterol (PROVENTIL HFA;VENTOLIN HFA) 108 (90 Base) MCG/ACT inhaler Inhale 2 puffs into the lungs every 6 (six) hours as needed for wheezing or shortness of breath. 1 Inhaler 2  . amLODipine (NORVASC) 10 MG tablet Take 10 mg by mouth daily.    [name] aspirin 81 MG tablet Take 81 mg by mouth daily.    . budesonide-formoterol (SYMBICORT) 80-4.5 MCG/ACT inhaler Inhale 2 puffs into the lungs 2 (two) times daily. 1 Inhaler 3  . carvedilol (COREG) 25 MG tablet Take 25 mg by mouth 2 (two) times daily with a meal.    . cetirizine (ZYRTEC) 10 MG tablet Take 10 mg by mouth daily.    . cyclobenzaprine (FLEXERIL) 10 MG tablet Take 1 tablet (10 mg total) by mouth at bedtime. 14 tablet 0  . diclofenac sodium (VOLTAREN) 1 % GEL Apply 2 g topically 4 (four) times daily. 3 Tube 2  . digoxin (LANOXIN) 0.125 MG tablet Take 0.125 mg by mouth daily.     [name] doxazosin (CARDURA) 8 MG tablet Take 8 mg by mouth daily.    [name]  fluticasone (FLONASE) 50 MCG/ACT nasal spray Place 2 sprays into both nostrils daily. 16 g 1  . fluticasone (FLONASE) 50 MCG/ACT nasal spray Place 2 sprays into both nostrils daily. 16 g 1  . furosemide (LASIX) 20 MG tablet Take 1 tablet (20 mg total) by mouth daily. 14 tablet 0  . hydrALAZINE (APRESOLINE) 100 MG tablet Take 0.5 tablets (50 mg total) by mouth 3 (three) times daily. 90 tablet 11  . HYDROcodone-homatropine (HYCODAN) 5-1.5 MG/5ML syrup Take 5 mLs by mouth every 8 (eight) hours as needed. 100 mL 0  . hydrOXYzine (ATARAX/VISTARIL) 10 MG tablet 1-2 tab po q 8 hours as needed itching. 30 tablet 0  . ipratropium (ATROVENT HFA) 17 MCG/ACT inhaler Inhale 2 puffs into the lungs every 6 (six) hours as needed for wheezing. 1 Inhaler 3  . irbesartan (AVAPRO) 300 MG tablet Take 1 tablet  (300 mg total) by mouth daily. 30 tablet 11  . ISOSORBIDE MONONITRATE PO Take 60 mg by mouth 2 (two) times daily.    . meclizine (ANTIVERT) 12.5 MG tablet Take 1 tablet (12.5 mg total) by mouth 3 (three) times daily as needed for dizziness. 30 tablet 0  . mometasone-formoterol (DULERA) 200-5 MCG/ACT AERO Inhale 2 puffs into the lungs 2 (two) times daily. 13 g 1  . montelukast (SINGULAIR) 10 MG tablet Take 1 tablet (10 mg total) by mouth at bedtime. 30 tablet 3  . omeprazole (PRILOSEC) 40 MG capsule Take 1 capsule (40 mg total) by mouth 2 (two) times daily. 180 capsule 3  . ondansetron (ZOFRAN ODT) 8 MG disintegrating tablet Take 1 tablet (8 mg total) by mouth every 8 (eight) hours as needed for nausea or vomiting. 20 tablet 0  . ranitidine (ZANTAC) 150 MG capsule Take 1 capsule (150 mg total) by mouth 2 (two) times daily. 60 capsule 6  . spironolactone (ALDACTONE) 25 MG tablet Take 25 mg by mouth daily.    [name] tobramycin (TOBREX) 0.3 % ophthalmic solution Place 2 drops into the left eye every 6 (six) hours. 5 mL 0  . traMADol (ULTRAM) 50 MG tablet 1-2 every 4 hours as needed for cough or pain 40 tablet 0   No current facility-administered medications on file prior to visit.     Past Surgical History:  Procedure Laterality Date  . ANKLE SURGERY Right   . CARDIAC CATHETERIZATION  yrs ago  . ESOPHAGOGASTRODUODENOSCOPY (EGD) WITH PROPOFOL N/A 01/13/2017   Procedure: ESOPHAGOGASTRODUODENOSCOPY (EGD) WITH PROPOFOL;  Surgeon: Irene Shipper, MD;  Location: [facility];  Service: Endoscopy;  Laterality: N/A;  . HAND SURGERY     Right  . HAND SURGERY     Left carpel tunnel  . THUMB ARTHROSCOPY Left     No Known Allergies  Social History   Socioeconomic History  . Marital status: Married    Spouse name: Not on file  . Number of children: Not on file  . Years of education: Not on file  . Highest education level: Not on file  Occupational History  . Not on file  Social Needs  . Financial  resource strain: Not on file  . Food insecurity:    Worry: Not on file    Inability: Not on file  . Transportation needs:    Medical: Not on file    Non-medical: Not on file  Tobacco Use  . Smoking status: Never Smoker  . Smokeless tobacco: Never Used  Substance and Sexual Activity  . Alcohol use: No  . Drug use:  No  . Sexual activity: Not on file  Lifestyle  . Physical activity:    Days per week: Not on file    Minutes per session: Not on file  . Stress: Not on file  Relationships  . Social connections:    Talks on phone: Not on file    Gets together: Not on file    Attends religious service: Not on file    Active member of club or organization: Not on file    Attends meetings of clubs or organizations: Not on file    Relationship status: Not on file  . Intimate partner violence:    Fear of current or ex partner: Not on file    Emotionally abused: Not on file    Physically abused: Not on file    Forced sexual activity: Not on file  Other Topics Concern  . Not on file  Social History Narrative   2 years college     Family History  Problem Relation Age of Onset  . Heart disease Mother   . Heart disease Father   . Colon cancer Neg Hx   . Esophageal cancer Neg Hx   . Pancreatic cancer Neg Hx   . Stomach cancer Neg Hx     There were no vitals taken for this visit.  Review of Systems: See HPI above.     Objective:  Physical Exam:  GEN: Awake, alert, no acute distress Pulmonary: Breathing unlabored  Right arm: No swelling, erythema, or bruising.  No obvious deformity He has superficial tenderness to palpation along the cephalic vein.  No induration.  No palpable cords He has full range of motion the wrist and elbow 5/5 strength at the wrist and elbow without pain Sensation intact to light touch   [place] evaluation of the anterior venous structures demonstrates easily compressible cephalic and basilic veins and primary branches throughout the right arm; no  filling defects.   Assessment & Plan:  1. Right arm pain - patient pain has remained persistent despite Voltaren gel and B6.  His symptoms and exam continue to appear consistent with nerve injury.  Ultrasound was repeated today which show no venous thrombosis. - Consider gabapentin - Neurology referral for evaluation, probable EMG/NCS - Follow-up as needed

## 2018-07-26 NOTE — Patient Instructions (Signed)
We will refer you to a neurologist for evaluation, probable NCVs/EMGs. Consider gabapentin. I doubt a vein specialist will help but this is another consideration if the neurology consult is normal and you're still having problems.

## 2018-08-02 DIAGNOSIS — I208 Other forms of angina pectoris: Secondary | ICD-10-CM | POA: Diagnosis not present

## 2018-10-01 ENCOUNTER — Other Ambulatory Visit: Payer: Self-pay | Admitting: Medical

## 2018-10-04 ENCOUNTER — Other Ambulatory Visit: Payer: Self-pay

## 2018-10-04 ENCOUNTER — Encounter: Payer: Self-pay | Admitting: Medical

## 2018-10-04 ENCOUNTER — Ambulatory Visit (INDEPENDENT_AMBULATORY_CARE_PROVIDER_SITE_OTHER): Payer: Commercial Managed Care - PPO | Admitting: Medical

## 2018-10-04 DIAGNOSIS — K047 Periapical abscess without sinus: Secondary | ICD-10-CM | POA: Diagnosis not present

## 2018-10-04 DIAGNOSIS — K0510 Chronic gingivitis, plaque induced: Secondary | ICD-10-CM

## 2018-10-04 MED ORDER — MAGIC MOUTHWASH: ORAL | 0 refills | Status: DC

## 2018-10-04 MED ORDER — AMOXICILLIN-POT CLAVULANATE 875-125 MG PO TABS: 1.0000 | ORAL_TABLET | Freq: Two times a day (BID) | ORAL | 0 refills | Status: DC

## 2018-10-04 NOTE — Patient Instructions (Addendum)
By patient's description and history, I do think he probably has some gingivitis with possible tooth infection.  I want to prescribe Augmentin antibiotic and Magic mouthwash.  Important to note that your gradually getting better and not worsening.  If you worsen please notify us.  Also please note Magic mouthwash is to swish and spit and not to swallow.  Follow-up in approximate 7 days with update by call or MyChart.  If symptoms worsen please contact us sooner.  In the event of worsening symptoms could try to get you scheduled with a dentist for evaluation.

## 2018-10-04 NOTE — Progress Notes (Signed)
   Subjective:    Patient ID: Matthew Nixon, male    DOB: 1960/09/04, 58 y.o.   MRN: [id number]  HPI  Virtual Visit via Video Note  I connected with [name] on 10/04/18 at  1:20 PM EDT by a video enabled telemedicine application and verified that I am speaking with the correct person using two identifiers.   I discussed the limitations of evaluation and management by telemedicine and the availability of in person appointments. The patient expressed understanding and agreed to proceed.   I could here and see pt as he explained what was going on but my audio was not working and pt could not here me. So I called pt and finished intervie.  History of Present Illness:   Pt states on top teeth at gums have been bleeding for past weeks. He can see the gums inflamed. He states teeth does not hurt at all.   Pt states in past he had symptoms like this and I gave him antibiotic. He states in past used listerine. That was I September of past year.  No fevers, no chills or sweats. No enlarged lymph nodes.   Observations/Objective: Face was not swollen. symmetric appearance to jaw line. On inspection upper gums did not look obviosley swollen on video.  Assessment and Plan: By patient's description and history, I do think he probably has some gingivitis with possible tooth infection.  I want to prescribe Augmentin antibiotic and Magic mouthwash.  Important to note that your gradually getting better and not worsening.  If you worsen please notify us.  Also please note Magic mouthwash is to swish and spit and not to swallow.  Follow-up in approximate 7 days with update by call or MyChart.  If symptoms worsen please contact us sooner.  In the event of worsening symptoms could try to get you scheduled with a dentist for evaluation.  Follow Up Instructions:    I discussed the assessment and treatment plan with the patient. The patient was provided an opportunity to ask questions and all  were answered. The patient agreed with the plan and demonstrated an understanding of the instructions.   The patient was advised to call back or seek an in-person evaluation if the symptoms worsen or if the condition fails to improve as anticipated.  I provided 15 minutes of non-face-to-face time during this encounter.   Mackie Pai, PA-C   Review of Systems See hpi.    Objective:   Physical Exam   See objective     Assessment & Plan:

## 2018-10-05 ENCOUNTER — Telehealth: Payer: Self-pay | Admitting: Medical

## 2018-10-05 MED ORDER — MAGIC MOUTHWASH: ORAL | 0 refills | Status: DC

## 2018-10-05 NOTE — Telephone Encounter (Signed)
Would you call pt pharmacy. Rx'd magic mouthwash yesterday. Pharmacy wants to know specifics.  Disp:200 ml equal volumes of maalox, liquid benadryl and vicous lidocaine.  Sig: 5 ml po qid swish and spit.   Printed rx yesterday

## 2018-10-05 NOTE — Telephone Encounter (Signed)
Rx re-sent w/ needed information.

## 2018-10-05 NOTE — Telephone Encounter (Signed)
Copied from [place] [id number]. Topic: Quick Communication - Rx Refill/Question >> Oct 05, 2018  1:09 PM Alanda Slim E wrote: Medication: magic mouthwash SOLN - Pharmacy needs to know what ingredients are wanted and the ratio of each ingredient / a new Rx can be sent if not wanting to call to clarify / please advise   Has the patient contacted their pharmacy? Yes   Preferred Pharmacy (with phone number or street name): [place], [place] [phone] (Phone) [phone] (Fax)    Agent: Please be advised that RX refills may take up to 3 business days. We ask that you follow-up with your pharmacy.

## 2018-10-25 ENCOUNTER — Telehealth: Payer: Self-pay | Admitting: *Deleted

## 2018-10-25 NOTE — Telephone Encounter (Signed)
Matthew Nixon -- Pt last seen by you for acute visit on 10/04/18. When is he due for routine OV for ?htn. There are a few medications on his med list for BP but it looks like they have not been filled since 2018?  Please advise?

## 2018-10-25 NOTE — Telephone Encounter (Signed)
Would ask he be scheduled early to mid June. Early morning so he can fast. Want to check cmp and lipid panel. Let him know can get scheduled now and may have to convert to virtual depending on pandemic situation.

## 2018-10-27 NOTE — Telephone Encounter (Signed)
Notified pt and scheduled OV for 12/08/18 at 8:20am.

## 2018-12-08 ENCOUNTER — Ambulatory Visit: Payer: Medicare Other | Admitting: Medical

## 2019-02-11 ENCOUNTER — Encounter: Payer: Self-pay | Admitting: Medical

## 2019-02-11 ENCOUNTER — Other Ambulatory Visit: Payer: Self-pay

## 2019-02-11 ENCOUNTER — Ambulatory Visit (INDEPENDENT_AMBULATORY_CARE_PROVIDER_SITE_OTHER): Payer: Medicare Other | Admitting: Medical

## 2019-02-11 VITALS — BP 158/80 | Wt 190.0 lb

## 2019-02-11 DIAGNOSIS — K219 Gastro-esophageal reflux disease without esophagitis: Secondary | ICD-10-CM | POA: Diagnosis not present

## 2019-02-11 DIAGNOSIS — I429 Cardiomyopathy, unspecified: Secondary | ICD-10-CM

## 2019-02-11 DIAGNOSIS — I509 Heart failure, unspecified: Secondary | ICD-10-CM | POA: Diagnosis not present

## 2019-02-11 DIAGNOSIS — I10 Essential (primary) hypertension: Secondary | ICD-10-CM | POA: Diagnosis not present

## 2019-02-11 MED ORDER — OMEPRAZOLE 40 MG PO CPDR: 40.0000 mg | DELAYED_RELEASE_CAPSULE | Freq: Every day | ORAL | 3 refills | Status: DC

## 2019-02-11 NOTE — Progress Notes (Signed)
   Subjective:    Patient ID: Matthew Nixon, male    DOB: 1961-05-15, 58 y.o.   MRN: [id number]  HPI   Virtual Visit via Video Note  I connected with Matthew Nixon on 02/11/19 at  2:40 PM EDT by a video enabled telemedicine application and verified that I am speaking with the correct person using two identifiers.  Location: Patient: home Provider: office   I discussed the limitations of evaluation and management by telemedicine and the availability of in person appointments. The patient expressed understanding and agreed to proceed.    History of Present Illness: Pt has questions on benefit and risk of flu vaccine  Pt has still been working out and exercising daily. Moderate strenous exercise daily.  Pt states his stomach hurts after eating for weeks. He feels nausea and mild pain. Pt is eating healthy. He has gained little weight since last visit. Pt weight 190 lb. Pt has hx of gerd. Seen by gi md. Not on omepazle of zantac. States symptoms occur daily after he eats.  No chest pain on review.    Observations/Objective: General-no acute distress, pleasant, oriented. Lungs- on inspection lungs appear unlabored. Neck- no tracheal deviation or jvd on inspection. Neuro- gross motor function appears intact.  Assessment and Plan: On review of your prior GI history and your description of recent symptoms, I do think you have recurrence of GERD type symptoms.  This might have been worsening with recent weight gain.  I went ahead and sent in your omeprazole prescription that she used to formally.  I recommend that she continue with healthy diet.  Also I do think that if you lose back down to 184 range that this will help reduce or eliminate your symptoms.  Benefits versus risk of flu vaccine explained to patient.  Particularly in light of the COVID pandemic.  History of CHF, cardiomyopathy and hypertension.  Clinically stable and exercising regularly.  Blood pressure is little bit  elevated today but historically will improve when he loses weight.  Continue current medication regimen.  Patient does have cardiologist that follows him locally and has one in [place].  Follow-up date as needed depending on clinical response to treatment.  Follow Up Instructions:    I discussed the assessment and treatment plan with the patient. The patient was provided an opportunity to ask questions and all were answered. The patient agreed with the plan and demonstrated an understanding of the instructions.   The patient was advised to call back or seek an in-person evaluation if the symptoms worsen or if the condition fails to improve as anticipated.  I provided 25 minutes of non-face-to-face time during this encounter.   Mackie Pai, PA-C    Review of Systems  Constitutional: Negative for chills, fatigue and fever.  Respiratory: Negative for cough, chest tightness, shortness of breath and wheezing.   Cardiovascular: Negative for chest pain and palpitations.  Gastrointestinal: Positive for nausea. Negative for abdominal distention, abdominal pain, rectal pain and vomiting.  Musculoskeletal: Negative for back pain.       No pedal edema reported.  Psychiatric/Behavioral: Negative for behavioral problems.       Objective:   Physical Exam        Assessment & Plan:

## 2019-02-11 NOTE — Progress Notes (Signed)
   Subjective:    Patient ID: Matthew Nixon, male    DOB: August 30, 1960, 58 y.o.   MRN: [id number]  HPI    Review of Systems     Objective:   Physical Exam        Assessment & Plan:

## 2019-02-11 NOTE — Patient Instructions (Addendum)
On review of your prior GI history and your description of recent symptoms, I do think you have recurrence of GERD type symptoms.  This might have been worsening with recent weight gain.  I went ahead and sent in your omeprazole prescription that she used to formally.  I recommend that she continue with healthy diet.  Also I do think that if you lose back down to 184 range that this will help reduce or eliminate your symptoms.  Benefits versus risk of flu vaccine explained to patient.  Particularly in light of the COVID pandemic.  History of CHF, cardiomyopathy and hypertension.  Clinically stable and exercising regularly.  Blood pressure is little bit elevated today but historically will improve when he loses weight.  Continue current medication regimen.  Patient does have cardiologist that follows him locally and has one in [place].  Follow-up date as needed depending on clinical response to treatment.

## 2019-02-18 DIAGNOSIS — Z23 Encounter for immunization: Secondary | ICD-10-CM | POA: Diagnosis not present

## 2019-03-14 ENCOUNTER — Telehealth: Payer: Self-pay

## 2019-03-14 NOTE — Telephone Encounter (Signed)
PA initiated via Covermymeds; KEY: [id number]. Awaiting determination.

## 2019-03-16 NOTE — Telephone Encounter (Signed)
PA cancelled- not covered by [place].

## 2019-03-22 IMAGING — CT CT ABD-PELV W/ CM
2 of 5 series · 15 of 46 positions shown, 17 images · IV contrast (ISOVUE 300)
Comparison: None.

CLINICAL DATA: Worsening right upper quadrant abdominal pain with
bloating.

EXAM:
CT ABDOMEN AND PELVIS WITH CONTRAST
TECHNIQUE: Multidetector CT imaging of the abdomen and pelvis was performed
using the standard protocol following bolus administration of
intravenous contrast.
CONTRAST:  100mL [id number] IOPAMIDOL ([id number]-[id number]) INJECTION 61%

[Series 2: abd/pel w · axial · 0.78mm/px · z∈[-466,-51]mm · 12 of 95 slices shown, 14 images]
[im 6/95  soft-tissue]
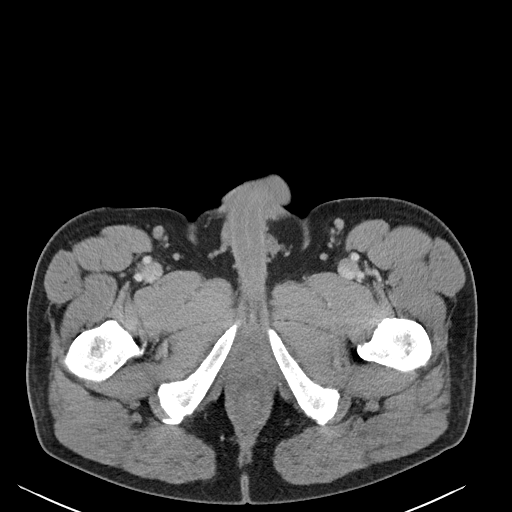
[im 6/95  bone]
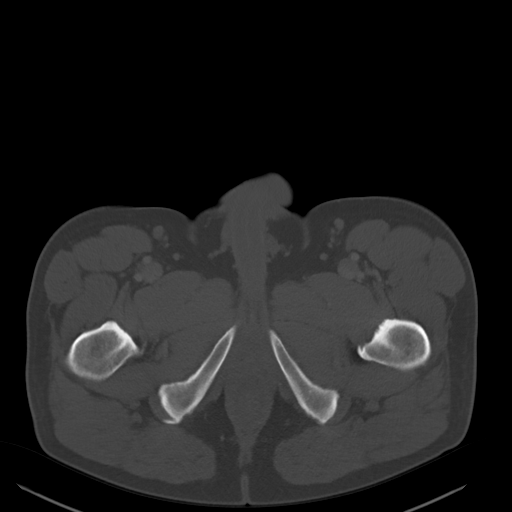
[im 16/95  soft-tissue]
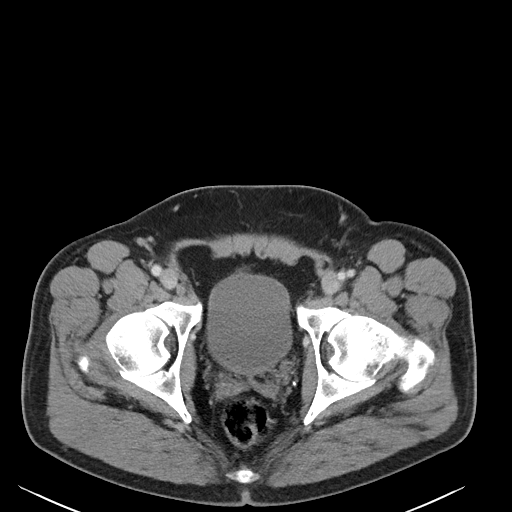
[im 21/95  soft-tissue]
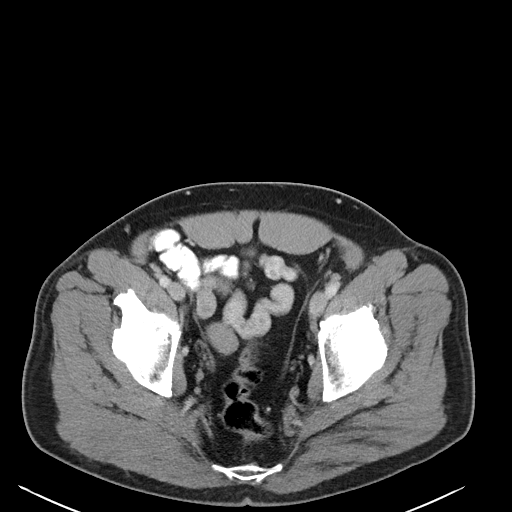
[im 27/95  soft-tissue]
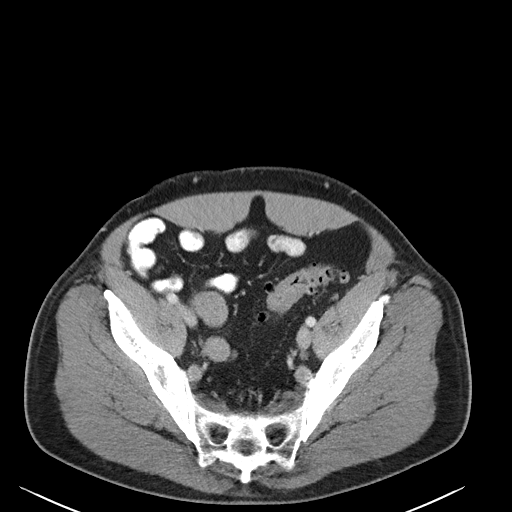
[im 37/95  soft-tissue]
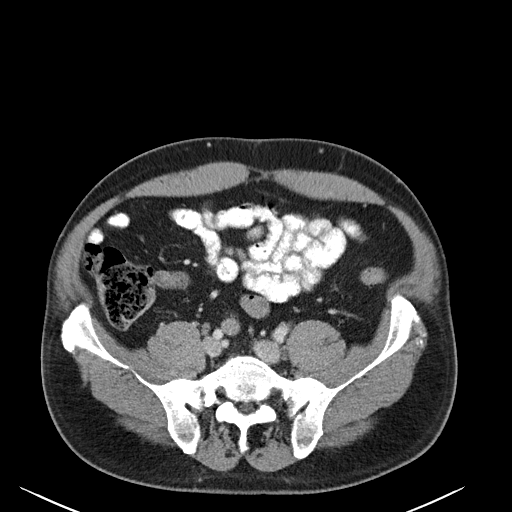
[im 42/95  soft-tissue]
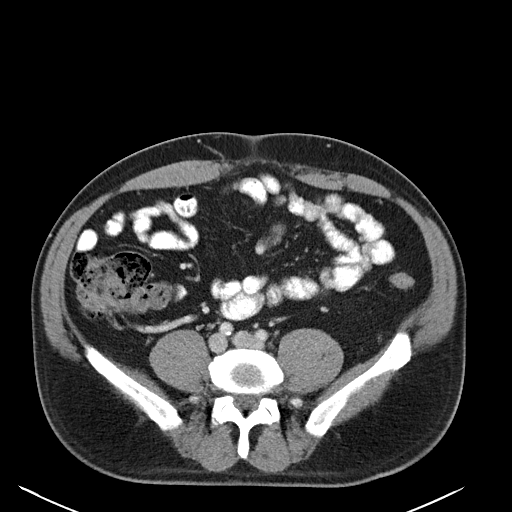
[im 53/95  soft-tissue]
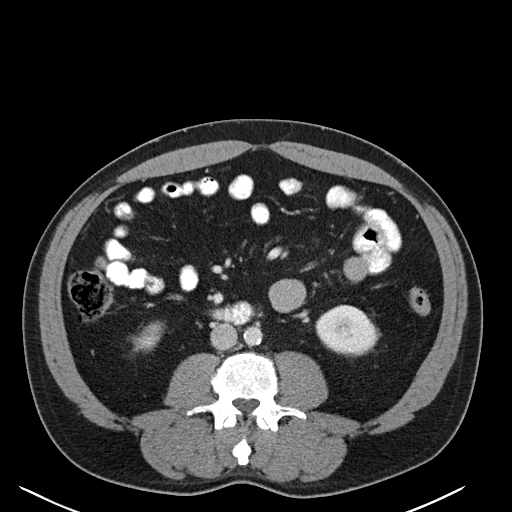
[im 58/95  soft-tissue]
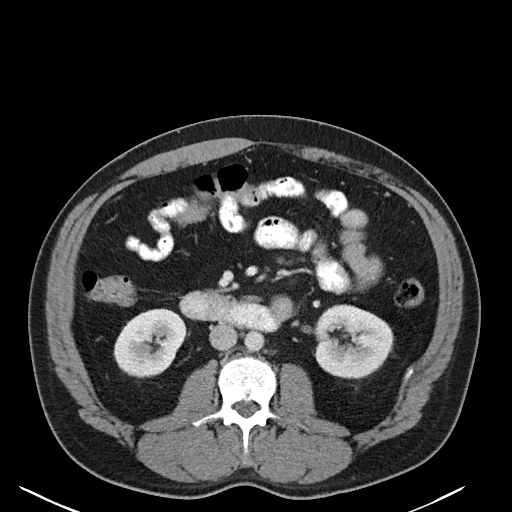
[im 68/95  soft-tissue]
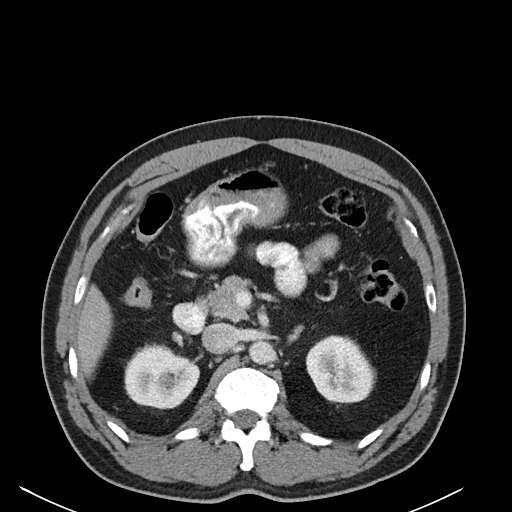
[im 68/95  bone]
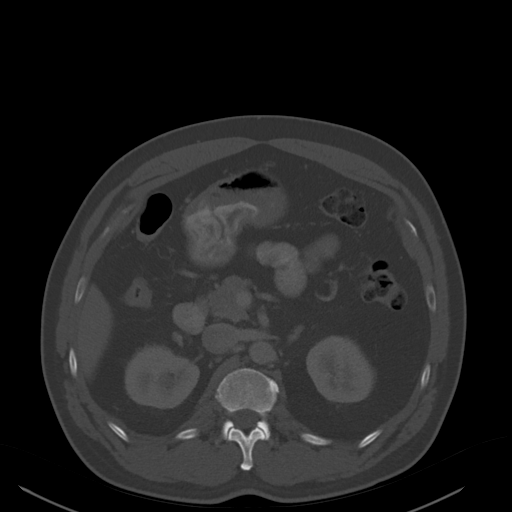
[im 74/95  soft-tissue]
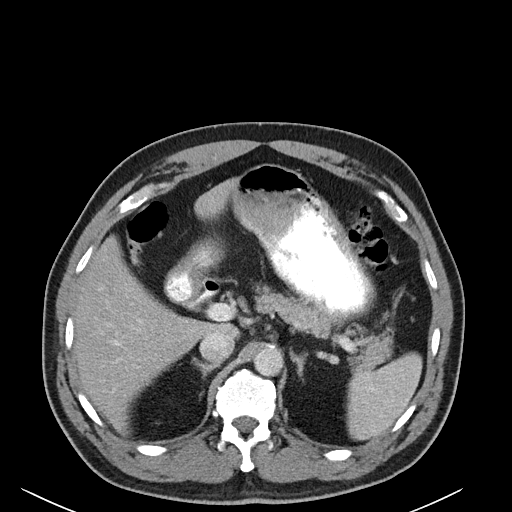
[im 79/95  soft-tissue]
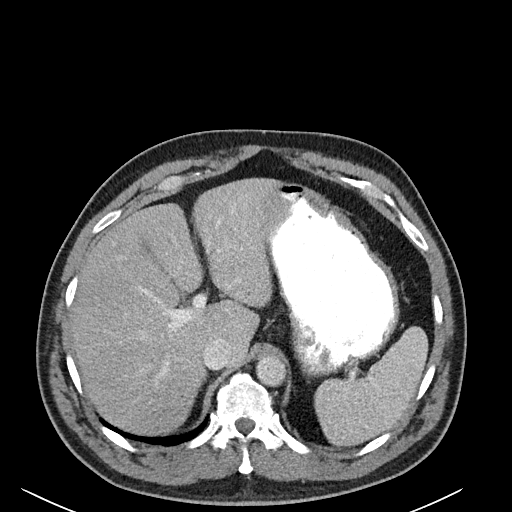
[im 89/95  soft-tissue]
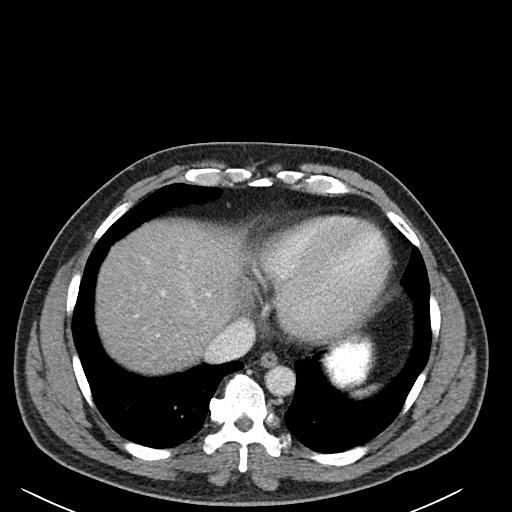

[Series 6: abd/pel w st · coronal · 0.69mm/px · 3 of 91 slices shown]
[im 31/91  soft-tissue]
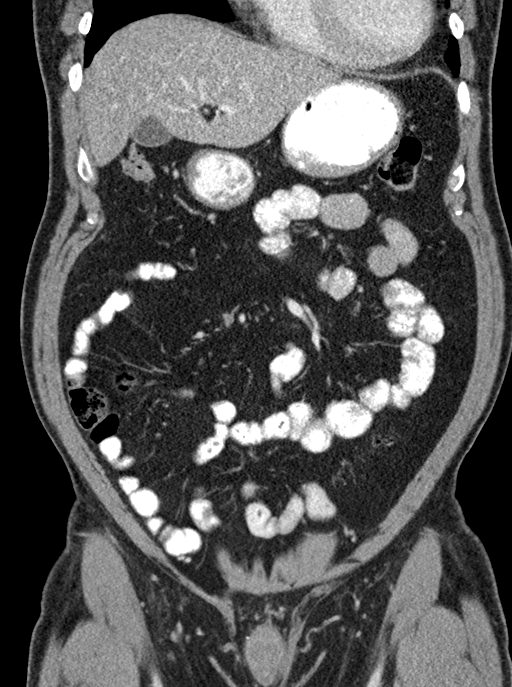
[im 41/91  soft-tissue]
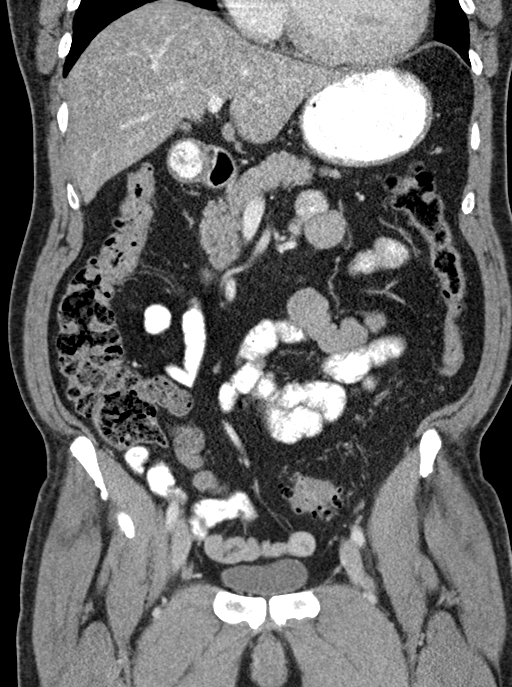
[im 51/91  soft-tissue]
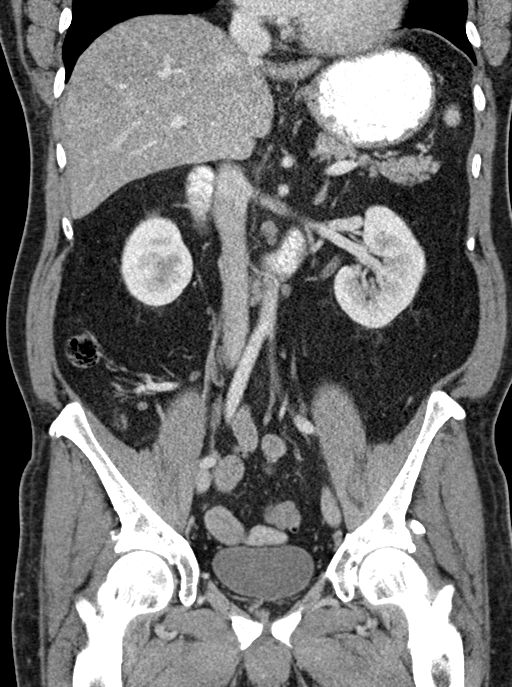

[15 of 46 positions shown; findings below may reference images not displayed]

FINDINGS: Lower chest:  Unremarkable.

Hepatobiliary: No focal abnormality within the liver parenchyma.
There is no evidence for gallstones, gallbladder wall thickening, or
pericholecystic fluid. No intrahepatic or extrahepatic biliary
dilation.

Pancreas: No focal mass lesion. No dilatation of the main duct. No
intraparenchymal cyst. No peripancreatic edema.

Spleen: No splenomegaly. No focal mass lesion.

Adrenals/Urinary Tract: No adrenal nodule or mass. Kidneys are
unremarkable. No evidence for hydroureter. The urinary bladder
appears normal for the degree of distention.

Stomach/Bowel: Stomach is nondistended. No gastric wall thickening.
No evidence of outlet obstruction. Duodenum is normally positioned
as is the ligament of Treitz. No small bowel wall thickening. No
small bowel dilatation. The terminal ileum is normal. The appendix
is normal. Diverticular changes are noted in the left colon without
evidence of diverticulitis.

Vascular/Lymphatic: There is abdominal aortic atherosclerosis
without aneurysm. Portal vein and superior mesenteric vein are
patent. Celiac axis, SMA, and IMA are patent. There is no
gastrohepatic or hepatoduodenal ligament lymphadenopathy. No
intraperitoneal or retroperitoneal lymphadenopathy. No pelvic
sidewall lymphadenopathy.

Reproductive: The prostate gland and seminal vesicles have normal
imaging features.

Other: No intraperitoneal free fluid.

Musculoskeletal: Bone windows reveal no worrisome lytic or sclerotic
osseous lesions.
IMPRESSION: 1. No findings to explain the patient's history of worsening right
upper quadrant pain with bloating and fever. Specifically, no
evidence for abscess.
2. Left colonic diverticulosis without diverticulitis.
3.  Abdominal Aortic Atherosclerois ([id number])

## 2019-04-04 ENCOUNTER — Encounter: Payer: Self-pay | Admitting: Medical

## 2019-04-04 ENCOUNTER — Ambulatory Visit (HOSPITAL_BASED_OUTPATIENT_CLINIC_OR_DEPARTMENT_OTHER)
Admission: RE | Admit: 2019-04-04 | Discharge: 2019-04-04 | Disposition: A | Discharge status: Home / Self Care | Payer: Commercial Managed Care - PPO | Source: Ambulatory Visit | Attending: Medical | Admitting: Medical

## 2019-04-04 ENCOUNTER — Ambulatory Visit (INDEPENDENT_AMBULATORY_CARE_PROVIDER_SITE_OTHER): Payer: Medicare Other | Admitting: Medical

## 2019-04-04 ENCOUNTER — Other Ambulatory Visit: Payer: Self-pay

## 2019-04-04 VITALS — BP 165/88 | HR 67 | Temp 97.7°F | Resp 16 | Ht 69.0 in | Wt 197.4 lb

## 2019-04-04 DIAGNOSIS — I429 Cardiomyopathy, unspecified: Secondary | ICD-10-CM

## 2019-04-04 DIAGNOSIS — R109 Unspecified abdominal pain: Secondary | ICD-10-CM | POA: Diagnosis not present

## 2019-04-04 DIAGNOSIS — R0600 Dyspnea, unspecified: Secondary | ICD-10-CM | POA: Insufficient documentation

## 2019-04-04 DIAGNOSIS — M549 Dorsalgia, unspecified: Secondary | ICD-10-CM | POA: Diagnosis not present

## 2019-04-04 DIAGNOSIS — R0609 Other forms of dyspnea: Secondary | ICD-10-CM | POA: Diagnosis not present

## 2019-04-04 LAB — COMPREHENSIVE METABOLIC PANEL
ALT: 28 U/L (ref 0–53)
AST: 21 U/L (ref 0–37)
Albumin: 4.4 g/dL (ref 3.5–5.2)
Alkaline Phosphatase: 74 U/L (ref 39–117)
BUN: 12 mg/dL (ref 6–23)
CO2: 32 mEq/L (ref 19–32)
Calcium: 10 mg/dL (ref 8.4–10.5)
Chloride: 103 mEq/L (ref 96–112)
Creatinine, Ser: 0.92 mg/dL (ref 0.40–1.50)
GFR: 84.11 mL/min (ref >60.00)
Glucose, Bld: 75 mg/dL (ref 70–99)
Potassium: 4.1 mEq/L (ref 3.5–5.1)
Sodium: 140 mEq/L (ref 135–145)
Total Bilirubin: 0.4 mg/dL (ref 0.2–1.2)
Total Protein: 7.2 g/dL (ref 6.0–8.3)

## 2019-04-04 LAB — CBC WITH DIFFERENTIAL/PLATELET
Basophils Absolute: 0 10*3/uL (ref 0.0–0.1)
Basophils Relative: 0.5 % (ref 0.0–3.0)
Eosinophils Absolute: 0.1 10*3/uL (ref 0.0–0.7)
Eosinophils Relative: 1.8 % (ref 0.0–5.0)
HCT: 49.4 % (ref 39.0–52.0)
Hemoglobin: 16.7 g/dL (ref 13.0–17.0)
Lymphocytes Relative: 26.5 % (ref 12.0–46.0)
Lymphs Abs: 2 10*3/uL (ref 0.7–4.0)
MCHC: 33.8 g/dL (ref 30.0–36.0)
MCV: 95.3 fl (ref 78.0–100.0)
Monocytes Absolute: 0.7 10*3/uL (ref 0.1–1.0)
Monocytes Relative: 9.6 % (ref 3.0–12.0)
Neutro Abs: 4.6 10*3/uL (ref 1.4–7.7)
Neutrophils Relative %: 61.6 % (ref 43.0–77.0)
Platelets: 225 10*3/uL (ref 150.0–400.0)
RBC: 5.18 Mil/uL (ref 4.22–5.81)
RDW: 13.2 % (ref 11.5–15.5)
WBC: 7.4 10*3/uL (ref 4.0–10.5)

## 2019-04-04 LAB — POC URINALSYSI DIPSTICK (AUTOMATED)
Bilirubin, UA: NEGATIVE
Blood, UA: NEGATIVE
Glucose, UA: NEGATIVE
Ketones, UA: NEGATIVE
Leukocytes, UA: NEGATIVE
Nitrite, UA: NEGATIVE
Protein, UA: NEGATIVE
Spec Grav, UA: 1.015 (ref 1.010–1.025)
Urobilinogen, UA: NEGATIVE E.U./dL — AB
pH, UA: 7 (ref 5.0–8.0)

## 2019-04-04 LAB — BRAIN NATRIURETIC PEPTIDE: Pro B Natriuretic peptide (BNP): 86 pg/mL (ref 0.0–100.0)

## 2019-04-04 LAB — LIPASE: Lipase: 22 U/L (ref 11.0–59.0)

## 2019-04-04 NOTE — Progress Notes (Signed)
Subjective:    Patient ID: Matthew Nixon, male    DOB: [birth year], 58 y.o.   MRN: [id number]  HPI Pt in with some rt side abdomen pain. Pain for about 6 weeks. Pain noticed at rest. If does his 200 sit ups does not have pain.  No pain after eating(no nausea, no vomiting). All day long has discomfort. But while working out does not have pain. Some pain rt side back area.   Pt in January 28, 2018 possible kidney stone by  [place]  but 4 days later ct showed not stone. Initial urine on 8th showed blood. Repeat showed not blow on February 01, 2018  Pt urine looks clear today.    Review of Systems  Constitutional: Negative for chills, fatigue and fever.  Respiratory: Negative for cough, chest tightness, wheezing and stridor.   Cardiovascular: Negative for chest pain and palpitations.  Gastrointestinal: Positive for abdominal distention and abdominal pain. Negative for constipation, diarrhea, rectal pain and vomiting.       Twice a day stool pattern. Pt taking fiber recently. States normal appearance to stool. Last bm 12:55 today.  Genitourinary: Negative for dysuria, frequency, penile pain, testicular pain and urgency.  Musculoskeletal: Negative for back pain, myalgias, neck pain and neck stiffness.  Skin: Negative for rash.  Neurological: Negative for dizziness, speech difficulty, weakness and light-headedness.  Hematological: Negative for adenopathy. Does not bruise/bleed easily.  Psychiatric/Behavioral: Negative for behavioral problems, confusion and sleep disturbance. The patient is not nervous/anxious.     Past Medical History:  Diagnosis Date  . Allergy   . Cardiomyopathy ([place])    DCM with EF 32% on echo 2014 and no ischemia on nuclear stress test.  EF improved to 40-45% by echo 02/2017  . Chronic back pain    low back  . Chronic systolic CHF (congestive heart failure), NYHA class 2 ([place]) 08/07/2016  . COPD (chronic obstructive pulmonary disease) ([place])   . Dyspnea    with exertion   . EKG, abnormal    history of left bundle block since 2002 per pt  . GERD (gastroesophageal reflux disease)   . Headache   . Hypertension   . LBBB (left bundle branch block)   . OSA on CPAP 08/07/2016  . Oxygen deficiency   . Pain    right side pain for many years  . PTSD (post-traumatic stress disorder)   . Sleep apnea      Social History   Socioeconomic History  . Marital status: Married    Spouse name: Not on file  . Number of children: Not on file  . Years of education: Not on file  . Highest education level: Not on file  Occupational History  . Not on file  Social Needs  . Financial resource strain: Not on file  . Food insecurity    Worry: Not on file    Inability: Not on file  . Transportation needs    Medical: Not on file    Non-medical: Not on file  Tobacco Use  . Smoking status: Never Smoker  . Smokeless tobacco: Never Used  Substance and Sexual Activity  . Alcohol use: No  . Drug use: No  . Sexual activity: Not on file  Lifestyle  . Physical activity    Days per week: Not on file    Minutes per session: Not on file  . Stress: Not on file  Relationships  . Social connections    Talks on phone: Not on file  Gets together: Not on file    Attends religious service: Not on file    Active member of club or organization: Not on file    Attends meetings of clubs or organizations: Not on file    Relationship status: Not on file  . Intimate partner violence    Fear of current or ex partner: Not on file    Emotionally abused: Not on file    Physically abused: Not on file    Forced sexual activity: Not on file  Other Topics Concern  . Not on file  Social History Narrative   2 years college     Past Surgical History:  Procedure Laterality Date  . ANKLE SURGERY Right   . CARDIAC CATHETERIZATION  yrs ago  . ESOPHAGOGASTRODUODENOSCOPY (EGD) WITH PROPOFOL N/A 01/13/2017   Procedure: ESOPHAGOGASTRODUODENOSCOPY (EGD) WITH PROPOFOL;  Surgeon: Irene Shipper,  MD;  Location: [facility];  Service: Endoscopy;  Laterality: N/A;  . HAND SURGERY     Right  . HAND SURGERY     Left carpel tunnel  . THUMB ARTHROSCOPY Left     Family History  Problem Relation Age of Onset  . Heart disease Mother   . Heart disease Father   . Colon cancer Neg Hx   . Esophageal cancer Neg Hx   . Pancreatic cancer Neg Hx   . Stomach cancer Neg Hx     No Known Allergies  Current Outpatient Medications on File Prior to Visit  Medication Sig Dispense Refill  . albuterol (PROVENTIL HFA;VENTOLIN HFA) 108 (90 Base) MCG/ACT inhaler Inhale 2 puffs into the lungs every 6 (six) hours as needed for wheezing or shortness of breath. 1 Inhaler 0  . amLODipine (NORVASC) 10 MG tablet Take 10 mg by mouth daily.    [name] aspirin 81 MG tablet Take 81 mg by mouth daily.    . budesonide-formoterol (SYMBICORT) 80-4.5 MCG/ACT inhaler Inhale 2 puffs into the lungs 2 (two) times daily. 1 Inhaler 3  . carvedilol (COREG) 25 MG tablet Take 25 mg by mouth 2 (two) times daily with a meal.    . cetirizine (ZYRTEC) 10 MG tablet Take 10 mg by mouth daily.    . diclofenac sodium (VOLTAREN) 1 % GEL Apply 2 g topically 4 (four) times daily. 3 Tube 2  . digoxin (LANOXIN) 0.125 MG tablet Take 0.125 mg by mouth daily.     [name] doxazosin (CARDURA) 8 MG tablet Take 8 mg by mouth daily.    . fluticasone (FLONASE) 50 MCG/ACT nasal spray Place 2 sprays into both nostrils daily. 16 g 1  . furosemide (LASIX) 20 MG tablet Take 1 tablet (20 mg total) by mouth daily. 14 tablet 0  . hydrALAZINE (APRESOLINE) 100 MG tablet Take 0.5 tablets (50 mg total) by mouth 3 (three) times daily. 90 tablet 11  . hydrOXYzine (ATARAX/VISTARIL) 10 MG tablet 1-2 tab po q 8 hours as needed itching. 30 tablet 0  . ipratropium (ATROVENT HFA) 17 MCG/ACT inhaler Inhale 2 puffs into the lungs every 6 (six) hours as needed for wheezing. 1 Inhaler 3  . irbesartan (AVAPRO) 300 MG tablet Take 1 tablet (300 mg total) by mouth daily. 30 tablet 11   . ISOSORBIDE MONONITRATE PO Take 60 mg by mouth 2 (two) times daily.    . magic mouthwash SOLN 5 ml po qid swish and spit. 200 mL 0  . meclizine (ANTIVERT) 12.5 MG tablet Take 1 tablet (12.5 mg total) by mouth 3 (three) times daily as  needed for dizziness. 30 tablet 0  . mometasone-formoterol (DULERA) 200-5 MCG/ACT AERO Inhale 2 puffs into the lungs 2 (two) times daily. 13 g 1  . montelukast (SINGULAIR) 10 MG tablet Take 1 tablet (10 mg total) by mouth at bedtime. 30 tablet 3  . omeprazole (PRILOSEC) 40 MG capsule Take 1 capsule (40 mg total) by mouth daily. 30 capsule 3  . ondansetron (ZOFRAN ODT) 8 MG disintegrating tablet Take 1 tablet (8 mg total) by mouth every 8 (eight) hours as needed for nausea or vomiting. 20 tablet 0  . ranitidine (ZANTAC) 150 MG capsule Take 1 capsule (150 mg total) by mouth 2 (two) times daily. 60 capsule 6  . spironolactone (ALDACTONE) 25 MG tablet Take 25 mg by mouth daily.    [name] tobramycin (TOBREX) 0.3 % ophthalmic solution Place 2 drops into the left eye every 6 (six) hours. 5 mL 0  . traMADol (ULTRAM) 50 MG tablet 1-2 every 4 hours as needed for cough or pain 40 tablet 0   No current facility-administered medications on file prior to visit.     BP (!) 175/75   Pulse 67   Temp 97.7 F (36.5 C) (Temporal)   Resp 16   Ht 5\' 9"  (1.753 m)   Wt 197 lb 6.4 oz (89.5 kg)   SpO2 99%   BMI 29.15 kg/m       Objective:   Physical Exam  General Mental Status- Alert. General Appearance- Not in acute distress.   Skin General: Color- Normal Color. Moisture- Normal Moisture.  Neck Carotid Arteries- Normal color. Moisture- Normal Moisture. No carotid bruits. No JVD.  Chest and Lung Exam Auscultation: Breath Sounds:-Normal.  Cardiovascular Auscultation:Rythm- Regular. Murmurs & Other Heart Sounds:Auscultation of the heart reveals- No Murmurs.  Abdomen Inspection:-Inspeection Normal. Palpation/Percussion:Note:No mass. Palpation and Percussion of the  abdomen reveal- Non Distended + BS, no rebound or guarding. No rt upper quadrant pain. No rt lower quadrant pain.Pain more mild flank. Moderate.    Neurologic Cranial Nerve exam:- CN III-XII intact(No nystagmus), symmetric smile. Strength:- 5/5 equal and symmetric strength both upper and lower extremities.      Assessment & Plan:  For abdomen pain and flank pain, I will get abdomen xray, US abdomen, cbc, cmp, and lipase. Hold off on any extra fiber for now. If abd pain increases le me know. May need ct abdomen/pelvis but no blood in urine. So doubt stone and doubt appendix issue.  Will get bnp and chest xray due to hx of cardiomyopathy and chest xray.  BP elevated. Low salt diet and try to loose weight. Might need to increase bp med regimen but need to review cxr first.  Follow up in 7-10 days or as needed  25+ minutes spent with pt. 50% of time spent counseling pt on plan going forward.  Mackie Pai, PA-C

## 2019-04-04 NOTE — Patient Instructions (Signed)
For abdomen pain and flank pain, I will get abdomen xray, US abdomen, cbc, cmp, and lipase. Hold off on any extra fiber for now. If abd pain increases le me know. May need ct abdomen/pelvis but no blood in urine. So doubt stone and doubt appendix issue.  Will get bnp and chest xray due to hx of cardiomyopathy and chest xray.  BP elevated. Low salt diet and try to loose weight. Might need to increase bp med regimen but need to review cxr first.  Follow up in 7-10 days or as needed

## 2019-04-07 ENCOUNTER — Ambulatory Visit (HOSPITAL_BASED_OUTPATIENT_CLINIC_OR_DEPARTMENT_OTHER)
Admission: RE | Admit: 2019-04-07 | Discharge: 2019-04-07 | Disposition: A | Discharge status: Home / Self Care | Payer: Medicare Other | Source: Ambulatory Visit | Attending: Medical | Admitting: Medical

## 2019-04-07 ENCOUNTER — Telehealth: Payer: Self-pay | Admitting: *Deleted

## 2019-04-07 ENCOUNTER — Other Ambulatory Visit: Payer: Self-pay

## 2019-04-07 DIAGNOSIS — R109 Unspecified abdominal pain: Secondary | ICD-10-CM | POA: Diagnosis not present

## 2019-04-07 DIAGNOSIS — K760 Fatty (change of) liver, not elsewhere classified: Secondary | ICD-10-CM | POA: Diagnosis not present

## 2019-04-07 DIAGNOSIS — R10A0 Flank pain, unspecified side: Secondary | ICD-10-CM

## 2019-04-07 NOTE — Telephone Encounter (Signed)
-----   Message from Sueanne Margarita, MD sent at 04/06/2019  6:06 PM EDT ----- Regarding: RE: CPAP NOT WORKING FOR HIM Set up a virtual visit for next week  Traci ----- Message ----- From: Freada Bergeron, CMA Sent: 04/06/2019  11:00 AM EDT To: Sueanne Margarita, MD Subject: CPAP NOT WORKING FOR HIM                       Patient called stating his cpap is not helping him at all. He is still snoring, waking up at night and he can't breathe. He wants an appointment to see you or some kind of solution. His wife wants him out of the bed. Please advise

## 2019-04-07 NOTE — Telephone Encounter (Signed)
Appointment scheduled for 10/21 at 11:40.

## 2019-04-08 ENCOUNTER — Ambulatory Visit: Payer: Commercial Managed Care - PPO | Admitting: Medical

## 2019-04-12 ENCOUNTER — Telehealth: Payer: Self-pay | Admitting: *Deleted

## 2019-04-12 ENCOUNTER — Telehealth: Payer: Self-pay

## 2019-04-12 NOTE — Telephone Encounter (Signed)
Spoke with pt. Notified pt of ct results.

## 2019-04-12 NOTE — Telephone Encounter (Signed)

## 2019-04-12 NOTE — Telephone Encounter (Signed)
Copied from [place] [id number]. Topic: General - Other >> Apr 12, 2019  7:55 AM Rayann Heman wrote: Reason for CRM: pt called and stated that he would like a call back from the nurse regarding the appointment that he missed on 04/08/19. Please advise

## 2019-04-13 ENCOUNTER — Encounter: Payer: Self-pay | Admitting: Cardiology

## 2019-04-13 ENCOUNTER — Other Ambulatory Visit: Payer: Self-pay

## 2019-04-13 ENCOUNTER — Telehealth (INDEPENDENT_AMBULATORY_CARE_PROVIDER_SITE_OTHER): Payer: Medicare Other | Admitting: Cardiology

## 2019-04-13 VITALS — Ht 69.0 in | Wt 194.0 lb

## 2019-04-13 DIAGNOSIS — G4733 Obstructive sleep apnea (adult) (pediatric): Secondary | ICD-10-CM

## 2019-04-13 DIAGNOSIS — I447 Left bundle-branch block, unspecified: Secondary | ICD-10-CM | POA: Diagnosis not present

## 2019-04-13 DIAGNOSIS — J449 Chronic obstructive pulmonary disease, unspecified: Secondary | ICD-10-CM

## 2019-04-13 DIAGNOSIS — R0609 Other forms of dyspnea: Secondary | ICD-10-CM

## 2019-04-13 DIAGNOSIS — R0600 Dyspnea, unspecified: Secondary | ICD-10-CM

## 2019-04-13 DIAGNOSIS — I5022 Chronic systolic (congestive) heart failure: Secondary | ICD-10-CM | POA: Diagnosis not present

## 2019-04-13 DIAGNOSIS — Z9989 Dependence on other enabling machines and devices: Secondary | ICD-10-CM | POA: Diagnosis not present

## 2019-04-13 DIAGNOSIS — Z79899 Other long term (current) drug therapy: Secondary | ICD-10-CM | POA: Diagnosis not present

## 2019-04-13 DIAGNOSIS — I10 Essential (primary) hypertension: Secondary | ICD-10-CM | POA: Diagnosis not present

## 2019-04-13 DIAGNOSIS — I110 Hypertensive heart disease with heart failure: Secondary | ICD-10-CM | POA: Diagnosis not present

## 2019-04-13 DIAGNOSIS — I420 Dilated cardiomyopathy: Secondary | ICD-10-CM | POA: Diagnosis not present

## 2019-04-13 MED ORDER — HYDRALAZINE HCL 100 MG PO TABS: 100.0000 mg | ORAL_TABLET | Freq: Three times a day (TID) | ORAL | 3 refills | Status: DC

## 2019-04-13 NOTE — Patient Instructions (Addendum)
Medication Instructions:  Your physician has recommended you make the following change in your medication:  INCREASE Hydralazine to 100 mg three times per day  *If you need a refill on your cardiac medications before your next appointment, please call your pharmacy*   Lab Work: None Ordered   Testing/Procedures: Your physician has requested that you have an echocardiogram. Echocardiography is a painless test that uses sound waves to create images of your heart. It provides your doctor with information about the size and shape of your heart and how well your heart's chambers and valves are working. This procedure takes approximately one hour. There are no restrictions for this procedure.    Follow-Up: Your physician recommends that you return for a follow-up appointment in: [facility] with our pharmacist on Friday October 30 at 9:30 am   At [facility], you and your health needs are our priority.  As part of our continuing mission to provide you with exceptional heart care, we have created designated Provider Care Teams.  These Care Teams include your primary Cardiologist (physician) and Advanced Practice Providers (APPs -  Physician Assistants and Nurse Practitioners) who all work together to provide you with the care you need, when you need it.  Your next appointment:   3 weeks on Wednesday Nov. 11 at 8:00 am - virtual with Melina Copa, PA  The format for your next appointment:   Virtual Visit   Provider:   You may see Dr. Radford Pax or one of the following Advanced Practice Providers on your designated Care Team:    Melina Copa, PA-C  Ermalinda Barrios, PA-C   Other Instructions You have been referred to Drs. Para Skeans or Spicer for consultation regarding an Camera operator

## 2019-04-13 NOTE — Progress Notes (Signed)
Virtual Visit via Video Note   This visit type was conducted due to national recommendations for restrictions regarding the COVID-19 Pandemic (e.g. social distancing) in an effort to limit this patient's exposure and mitigate transmission in our community.  Due to his co-morbid illnesses, this patient is at least at moderate risk for complications without adequate follow up.  This format is felt to be most appropriate for this patient at this time.  All issues noted in this document were discussed and addressed.  A limited physical exam was performed with this format.  Please refer to the patient's chart for his consent to telehealth for [facility].   Evaluation Performed:  Follow-up visit  This visit type was conducted due to national recommendations for restrictions regarding the COVID-19 Pandemic (e.g. social distancing).  This format is felt to be most appropriate for this patient at this time.  All issues noted in this document were discussed and addressed.  No physical exam was performed (except for noted visual exam findings with Video Visits).  Please refer to the patient's chart (MyChart message for video visits and phone note for telephone visits) for the patient's consent to telehealth for [facility].  Date:  04/13/2019   ID:  Matthew Nixon, DOB 27-Mar-1961, MRN [id number]  Patient Location:  Home  Provider location:   [place]  PCP:  Mackie Pai, PA-C  Cardiologist:  Fransico Him, MD Electrophysiologist:  None   Chief Complaint:  DCM, CHF, HTN, OSA  History of Present Illness:    Matthew Nixon is a 58 y.o. male who presents via audio/video conferencing for a telehealth visit today.    Matthew Nixon is a 58 y.o. male with a hx of DCM with EF 32% (echo 2014) and repeat echo 02/2017 with improved LVF with EF 40-45% and no ischemia on nuclear stress, chronic systolic heart failure NYHA class II, COPD, hypertension, left bundle branch block and obstructive  sleep apnea. His last PSG showed mild obstructive sleep apnea with an overall AHI of 7.6/h with severe sleep apnea during REM sleep with an AHI 27.7/h. Oxygen desaturations dropped as low as 86%. He is on  CPAP at 9cm H2O.   He is here today for followup and is doing well. He has been having DOE which has increased recently but he attributes to not going to the gym.  He still has a pinching sensation in his chest that has not changed any since I saw him last. He is able to go out to walk several miles except when it is cold outside. He denies PND, orthopnea, LE edema, dizziness, palpitations or syncope. He is compliant with his meds and is tolerating meds with no SE.  He has been on CPAP but is having a lot or problems with his device.  He is not sleeping well and starts sweating during the night and coughs and struggles to breathe.  He gets claustrophobic and now uses the nasal pillow mask.  He feels the pressure is adequate.  He cannot really tell me what the problem is in why he cannot wear the mask.  He has some nasal obstruction and sinus issues as well.   The patient does not have symptoms concerning for COVID-19 infection (fever, chills, cough, or new shortness of breath).    Prior CV studies:   The following studies were reviewed today:  PAP compliance download  Past Medical History:  Diagnosis Date  . Allergy   . Cardiomyopathy ([place])    DCM with  EF 32% on echo 2014 and no ischemia on nuclear stress test.  EF improved to 40-45% by echo 02/2017  . Chronic back pain    low back  . Chronic systolic CHF (congestive heart failure), NYHA class 2 ([place]) 08/07/2016  . COPD (chronic obstructive pulmonary disease) ([place])   . Dyspnea    with exertion  . EKG, abnormal    history of left bundle block since 2002 per pt  . GERD (gastroesophageal reflux disease)   . Headache   . Hypertension   . LBBB (left bundle branch block)   . OSA on CPAP 08/07/2016  . Oxygen deficiency   . Pain    right  side pain for many years  . PTSD (post-traumatic stress disorder)   . Sleep apnea    Past Surgical History:  Procedure Laterality Date  . ANKLE SURGERY Right   . CARDIAC CATHETERIZATION  yrs ago  . ESOPHAGOGASTRODUODENOSCOPY (EGD) WITH PROPOFOL N/A 01/13/2017   Procedure: ESOPHAGOGASTRODUODENOSCOPY (EGD) WITH PROPOFOL;  Surgeon: Irene Shipper, MD;  Location: [facility] END[facility];  Service: Endoscopy;  Laterality: N/A;  . HAND SURGERY     Right  . HAND SURGERY     Left carpel tunnel  . THUMB ARTHROSCOPY Left      Current Meds  Medication Sig  . albuterol (PROVENTIL HFA;VENTOLIN HFA) 108 (90 Base) MCG/ACT inhaler Inhale 2 puffs into the lungs every 6 (six) hours as needed for wheezing or shortness of breath.  [name] amLODipine (NORVASC) 10 MG tablet Take 10 mg by mouth daily.  [name] aspirin 81 MG tablet Take 81 mg by mouth daily.  . budesonide-formoterol (SYMBICORT) 80-4.5 MCG/ACT inhaler Inhale 2 puffs into the lungs 2 (two) times daily.  . carvedilol (COREG) 25 MG tablet Take 25 mg by mouth 2 (two) times daily with a meal.  . cetirizine (ZYRTEC) 10 MG tablet Take 10 mg by mouth daily.  . diclofenac sodium (VOLTAREN) 1 % GEL Apply 2 g topically 4 (four) times daily.  . digoxin (LANOXIN) 0.125 MG tablet Take 0.125 mg by mouth daily.   [name] doxazosin (CARDURA) 8 MG tablet Take 8 mg by mouth daily.  . fluticasone (FLONASE) 50 MCG/ACT nasal spray Place 2 sprays into both nostrils daily.  . furosemide (LASIX) 20 MG tablet Take 1 tablet (20 mg total) by mouth daily.  . hydrALAZINE (APRESOLINE) 100 MG tablet Take 0.5 tablets (50 mg total) by mouth 3 (three) times daily.  . hydrOXYzine (ATARAX/VISTARIL) 10 MG tablet 1-2 tab po q 8 hours as needed itching.  [name] ipratropium (ATROVENT HFA) 17 MCG/ACT inhaler Inhale 2 puffs into the lungs every 6 (six) hours as needed for wheezing.  . irbesartan (AVAPRO) 300 MG tablet Take 1 tablet (300 mg total) by mouth daily.  . ISOSORBIDE MONONITRATE PO Take 60 mg by mouth 2 (two)  times daily.  . magic mouthwash SOLN 5 ml po qid swish and spit.  [name] meclizine (ANTIVERT) 12.5 MG tablet Take 1 tablet (12.5 mg total) by mouth 3 (three) times daily as needed for dizziness.  . mometasone-formoterol (DULERA) 200-5 MCG/ACT AERO Inhale 2 puffs into the lungs 2 (two) times daily.  . montelukast (SINGULAIR) 10 MG tablet Take 1 tablet (10 mg total) by mouth at bedtime.  [name] omeprazole (PRILOSEC) 40 MG capsule Take 1 capsule (40 mg total) by mouth daily.  . ondansetron (ZOFRAN ODT) 8 MG disintegrating tablet Take 1 tablet (8 mg total) by mouth every 8 (eight) hours as needed for nausea or vomiting.  [name]  ranitidine (ZANTAC) 150 MG capsule Take 1 capsule (150 mg total) by mouth 2 (two) times daily.  [name] spironolactone (ALDACTONE) 25 MG tablet Take 25 mg by mouth daily.  [name] tobramycin (TOBREX) 0.3 % ophthalmic solution Place 2 drops into the left eye every 6 (six) hours.  . traMADol (ULTRAM) 50 MG tablet 1-2 every 4 hours as needed for cough or pain     Allergies:   Patient has no known allergies.   Social History   Tobacco Use  . Smoking status: Never Smoker  . Smokeless tobacco: Never Used  Substance Use Topics  . Alcohol use: No  . Drug use: No     Family Hx: The patient's family history includes Heart disease in his father and mother. There is no history of Colon cancer, Esophageal cancer, Pancreatic cancer, or Stomach cancer.  ROS:   Please see the history of present illness.     All other systems reviewed and are negative.   Labs/Other Tests and Data Reviewed:    Recent Labs: 04/04/2019: ALT 28; BUN 12; Creatinine, Ser 0.92; Hemoglobin 16.7; Platelets 225.0; Potassium 4.1; Pro B Natriuretic peptide (BNP) 86.0; Sodium 140   Recent Lipid Panel Lab Results  Component Value Date/Time   CHOL 159 03/18/2016 07:08 AM   TRIG 118.0 03/18/2016 07:08 AM   HDL 38.90 (L) 03/18/2016 07:08 AM   CHOLHDL 4 03/18/2016 07:08 AM   LDLCALC 96 03/18/2016 07:08 AM    Wt Readings from  Last 3 Encounters:  04/13/19 194 lb (88 kg)  04/04/19 197 lb 6.4 oz (89.5 kg)  02/11/19 190 lb (86.2 kg)     Objective:    Vital Signs:  Ht 5\' 9"  (1.753 m)   Wt 194 lb (88 kg)   BMI 28.65 kg/m    ASSESSMENT & PLAN:    1.  DCM -prior nuclear stress test with no ischemia -echo 2018 with EF 40-45% -likely HTN DCM - nuclear stress test in [place] earlier this year with no ischemia -repeat echo to make sure LVF is stable -continue carvedilol 25mg  BID, Arlyce Harman 25mg  daily, Irbesartan 300mg  daily, Hydralazine 50mg  TID, Imdur 60mg  daily, and Cardura 8mg  daily  2.  Chronic systolic CHF NYHA class IIb -his weight is stable -he denies any  LE edema but gets SOB with activity and EF on recent nuclear stress test in [place] in Feb 2020 was reduced at 30%. -repeat echo to reevaluate LVF given worsening SOB and decline in EF on stress test -he needs aggressive BP control which I suspect his driving his DCM -continue ARB, BB, diuretic, Spiro, Hydralazine and nitrate -Creatinine 0.92 and K+ 4.1 last week  3.  HTN -BP poorly controlled at home -BP at home has been 175/17mmHg -continue Carvedilol 25mg  BID, amlodipine 10mg  daily, Spiro, ARB, doxazosin 8mg  daily and long acting nitrate -increase Hydralazine to 100mg  TID -followup in BP clinic in 1 week and PA in 3 weeks virtual  4.  OSA -  He has been on CPAP but is having a lot or problems with his device.  He is not sleeping well and starts sweating during the night and coughs and struggles to breathe.  He gets claustrophobic and now uses the nasal pillow mask.  He feels the pressure is adequate.  He cannot really tell me what the problem is in why he cannot wear the mask.  He has some nasal obstruction and sinus issues as well.  I have recommended referring him to ENT to discuss the [facility]  device since he is intolerant to PAP.    COVID-19 Education: The signs and symptoms of COVID-19 were discussed with the patient and how to seek care for testing (follow  up with PCP or arrange E-visit).  The importance of social distancing was discussed today.  Patient Risk:   After full review of this patient's clinical status, I feel that they are at least moderate risk at this time.  Time:   Today, I have spent 20 minutes directly with the patient on telemedicine discussing medical problems including OSA< HTN, CHF, DCM.  We also reviewed the symptoms of COVID 19 and the ways to protect against contracting the virus with telehealth technology.  I spent an additional 5 minutes reviewing patient's chart including PAP compliance download and labs.  Medication Adjustments/Labs and Tests Ordered: Current medicines are reviewed at length with the patient today.  Concerns regarding medicines are outlined above.  Tests Ordered: No orders of the defined types were placed in this encounter.  Medication Changes: No orders of the defined types were placed in this encounter.   Disposition:  Follow up in 1 week(s) in HTN clinic, 3 weeks with PA virtual and 6 month with TT virtual  Signed, Fransico Him, MD  04/13/2019 11:12 AM    [place]

## 2019-04-20 ENCOUNTER — Other Ambulatory Visit (HOSPITAL_COMMUNITY): Payer: Medicare Other

## 2019-04-20 NOTE — Patient Instructions (Addendum)
It was a pleasure seeing you in clinic today Mr. Delpilar!  Today the plan is... 1. Take your medications as followed: Morning Medications  amlodipine 10mg  daily  - 7 AM spironolactone 25 mg daily - 7 AM  Twice Daily Medications carvedilol 25mg  twice daily - 7 AM and 11 PM isosorbide mononitrate 60 mg twice daily - 7 AM and 11PM  Nighttime Medications irbesartan 300 mg daily - 11 PM doxazosin 8mg  daily  - 11 PM   Three Times Daily Medications hydralazine to 100mg  three times daily - 7AM, 3PM, 11PM  2. Take your blood pressure readings 2 hours AFTER you take your medications (either 9 AM, 5PM, or 1AM)  3. A pharmacist will call you in about 2 weeks on 05/04/19 to follow up regarding your new medication schedule and blood pressure readings.   Please call the PharmD clinic at [phone] if you have any questions that you would like to speak with a pharmacist about [facility], [place], [place]).

## 2019-04-20 NOTE — Progress Notes (Signed)
Patient ID: Matthew Nixon                 DOB: 1961/02/03                      MRN: [id number]     HPI: Matthew Nixon is a 58 y.o. male referred by Dr. Radford Pax to HTN clinic. PMH is significant for cardiomyopathy, HTN, hemorrhoids, chronic systolic HF, LBBB, COPD, allergic rhinitis, OSA (on CPAP), and GERD. Of note, pt has a hx of DCM with EF 32% (echo 2014)and repeat echo 02/2017 with improved LVF with EF 40-45%and no ischemia on nuclear stress. At last appt with Dr. Radford Pax (04/13/19), pt's BP at home was reported to be 175/74 mmHg and hydralazine was increased from hydralazine 50 mg TID to hydralazine 100 mg TID. Pt has previously been followed in HTN clinic by Dr. Patterson Hammersmith from 09/02/16 - 09/30/16; during these visits, compliance appeared to be a barrier for the patient. Also, pt had been noted to to attribute lack of blood pressure control to cold weather.  Pt presents for f/u appt with HTN clinic. Pt is tolerating increased dose of hydralazine. Pt confirms current medication schedule. He states he does not think he has taken any other blood pressure medications. Pt states when there is cold weather that his blood pressure "starts acting up". Pt brought in BP meter to be calibrated against manual blood pressure reading. Pt states he may forget to take his medications about once per month. Pt takes his blood pressure in the morning sometimes before and sometimes after he takes his blood pressure medications. Pt states he is always experiencing shortness of breathe due to cardiomyopathy. He states he will feel lightheaded or dizzy if he skips meals.   Of note, pt states he sleeps with CPAP for about 2 hours. His sleeping schedule has been this manner for multiple years. He states he often has nightmares from 9/11 as well. He sometimes follows with a provider for PTSD. Also, pt has a sharp, constant pain on the right side of his stomach that he attributes to acid reflux. He states he was eating too much  fiber. He states he follows with Dr. Henrene Nixon (gastroenterologist through [facility]).   Pt's blood pressure machine reading was 202/98 mmHg (right arm) and 180/93 mmHg (left arm). Pt is interested in purchasing a new blood pressure cuff.  Pt is going on vacation to the [place] from November 78GN 2020 and "will come back when it is warm outside"  Current HTN meds: carvedilol 25mg  BID (AM and PM), amlodipine 10mg  daily (AM), spironolactone 25 mg daily (sometimes afternoon), irbesartan 300 mg daily (AM), doxazosin 8mg  daily (AM), isosorbide mononitrate 60 mg BID (AM), hydralazine to 100mg  TID (1-2x per week will take twice daily; 5-6 days per week will take three times daily) Previously tried: quinapril (switched to irbesartan) BP goal: <130/80 mmHg  Family History: father (heart disease); mother (heart disease)  Social History: no alcohol or tobacco  Diet: 2-3 meals per day (breakfast, dinner) Foods: vegetables, fruit, chicken/pork/steak "once in while", soupy  No salty foods Eats fast food every six months  Coffee (no more than 2 inches in the cup) in the morning, soda once in a while   Exercise: 2-3x per week -Matthew Nixon arts  -Fatigue is a limiting factor   Home BP readings (pt brought in meter; pt does not check BP regularly)  186/86 68 186/83 68 186/88 71 166/76 61 176/74 62 162/70 77  161/77 83 145/77 83 106/72 67 - may be a reading from 07/2018 112/71 71 - may be a reading from 07/2018 123/77 71 - may be a reading from 07/2018 120/76 69 - may be a reading from 07/2018 128/77 61 - may be a reading from 07/2018 164/79 87 186/86 68  Wt Readings from Last 3 Encounters:  04/13/19 194 lb (88 kg)  04/04/19 197 lb 6.4 oz (89.5 kg)  02/11/19 190 lb (86.2 kg)   BP Readings from Last 3 Encounters:  04/04/19 (!) 165/88  02/11/19 (!) 158/80  07/26/18 (!) 149/76   Pulse Readings from Last 3 Encounters:  04/04/19 67  07/26/18 66  06/11/18 61    Renal function:  Estimated Creatinine Clearance: 94.9 mL/min (by C-G formula based on SCr of 0.92 mg/dL).  Past Medical History:  Diagnosis Date  . Allergy   . Cardiomyopathy ([place])    DCM with EF 32% on echo 2014 and no ischemia on nuclear stress test.  EF improved to 40-45% by echo 02/2017  . Chronic back pain    low back  . Chronic systolic CHF (congestive heart failure), NYHA class 2 ([place]) 08/07/2016  . COPD (chronic obstructive pulmonary disease) ([place])   . Dyspnea    with exertion  . EKG, abnormal    history of left bundle block since 2002 per pt  . GERD (gastroesophageal reflux disease)   . Headache   . Hypertension   . LBBB (left bundle branch block)   . OSA on CPAP 08/07/2016  . Oxygen deficiency   . Pain    right side pain for many years  . PTSD (post-traumatic stress disorder)   . Sleep apnea     Current Outpatient Medications on File Prior to Visit  Medication Sig Dispense Refill  . albuterol (PROVENTIL HFA;VENTOLIN HFA) 108 (90 Base) MCG/ACT inhaler Inhale 2 puffs into the lungs every 6 (six) hours as needed for wheezing or shortness of breath. 1 Inhaler 0  . amLODipine (NORVASC) 10 MG tablet Take 10 mg by mouth daily.    [name] aspirin 81 MG tablet Take 81 mg by mouth daily.    . budesonide-formoterol (SYMBICORT) 80-4.5 MCG/ACT inhaler Inhale 2 puffs into the lungs 2 (two) times daily. 1 Inhaler 3  . carvedilol (COREG) 25 MG tablet Take 25 mg by mouth 2 (two) times daily with a meal.    . cetirizine (ZYRTEC) 10 MG tablet Take 10 mg by mouth daily.    . diclofenac sodium (VOLTAREN) 1 % GEL Apply 2 g topically 4 (four) times daily. 3 Tube 2  . digoxin (LANOXIN) 0.125 MG tablet Take 0.125 mg by mouth daily.     [name] doxazosin (CARDURA) 8 MG tablet Take 8 mg by mouth daily.    . fluticasone (FLONASE) 50 MCG/ACT nasal spray Place 2 sprays into both nostrils daily. 16 g 1  . furosemide (LASIX) 20 MG tablet Take 1 tablet (20 mg total) by mouth daily. 14 tablet 0  . hydrALAZINE (APRESOLINE) 100 MG  tablet Take 1 tablet (100 mg total) by mouth 3 (three) times daily. 270 tablet 3  . hydrOXYzine (ATARAX/VISTARIL) 10 MG tablet 1-2 tab po q 8 hours as needed itching. 30 tablet 0  . ipratropium (ATROVENT HFA) 17 MCG/ACT inhaler Inhale 2 puffs into the lungs every 6 (six) hours as needed for wheezing. 1 Inhaler 3  . irbesartan (AVAPRO) 300 MG tablet Take 1 tablet (300 mg total) by mouth daily. 30 tablet 11  . ISOSORBIDE  MONONITRATE PO Take 60 mg by mouth 2 (two) times daily.    . magic mouthwash SOLN 5 ml po qid swish and spit. 200 mL 0  . meclizine (ANTIVERT) 12.5 MG tablet Take 1 tablet (12.5 mg total) by mouth 3 (three) times daily as needed for dizziness. 30 tablet 0  . mometasone-formoterol (DULERA) 200-5 MCG/ACT AERO Inhale 2 puffs into the lungs 2 (two) times daily. 13 g 1  . montelukast (SINGULAIR) 10 MG tablet Take 1 tablet (10 mg total) by mouth at bedtime. 30 tablet 3  . omeprazole (PRILOSEC) 40 MG capsule Take 1 capsule (40 mg total) by mouth daily. 30 capsule 3  . ondansetron (ZOFRAN ODT) 8 MG disintegrating tablet Take 1 tablet (8 mg total) by mouth every 8 (eight) hours as needed for nausea or vomiting. 20 tablet 0  . ranitidine (ZANTAC) 150 MG capsule Take 1 capsule (150 mg total) by mouth 2 (two) times daily. 60 capsule 6  . spironolactone (ALDACTONE) 25 MG tablet Take 25 mg by mouth daily.    [name] tobramycin (TOBREX) 0.3 % ophthalmic solution Place 2 drops into the left eye every 6 (six) hours. 5 mL 0  . traMADol (ULTRAM) 50 MG tablet 1-2 every 4 hours as needed for cough or pain 40 tablet 0   No current facility-administered medications on file prior to visit.     No Known Allergies   Assessment/Plan:  1. Hypertension - goal <130/80 mmHg; therefore, pt is not at goal.  It appears patient's blood pressure is not controlled due to lack of consistency in administration of blood pressure medications. It is challenging to assess why patient had a time period of optimal blood  pressure control considering patient does not record timing of blood pressure readings. Pt is interested in purchasing a new blood pressure cuff. Patient's hydralazine was last increased at last appt with Dr. Radford Pax (04/13/19). Plan for patient to start taking medications at consistent time schedule. Medication schedule listed below.  amlodipine 10mg  daily  - 7 AM spironolactone 25 mg daily - 7 AM carvedilol 25mg  twice daily - 7 AM and 11 PM isosorbide mononitrate 60 mg twice daily - 7 AM and 11PM irbesartan 300 mg daily - 11 PM doxazosin 8mg  daily  - 11 PM  hydralazine to 100mg  three times daily - 7AM, 3PM, 11PM  Also, plan for patient to take blood pressure readings ~2 hours after taking blood pressure medications. Will follow up with patient via telephone (05/04/19) before patient leaves for vacation.  Thank you for involving pharmacy to assist in providing Mr. Cordner's care.    Drexel Iha, PharmD PGY2 Ambulatory Care Pharmacy Resident

## 2019-04-22 ENCOUNTER — Telehealth: Payer: Self-pay | Admitting: *Deleted

## 2019-04-22 ENCOUNTER — Other Ambulatory Visit: Payer: Self-pay

## 2019-04-22 ENCOUNTER — Ambulatory Visit (INDEPENDENT_AMBULATORY_CARE_PROVIDER_SITE_OTHER): Payer: Medicare Other | Admitting: Pharmacist

## 2019-04-22 ENCOUNTER — Ambulatory Visit (HOSPITAL_COMMUNITY): Payer: Medicare Other | Attending: Cardiology

## 2019-04-22 ENCOUNTER — Ambulatory Visit: Payer: Medicare Other

## 2019-04-22 VITALS — BP 162/94 | HR 59

## 2019-04-22 DIAGNOSIS — I420 Dilated cardiomyopathy: Secondary | ICD-10-CM

## 2019-04-22 DIAGNOSIS — I10 Essential (primary) hypertension: Secondary | ICD-10-CM

## 2019-04-22 DIAGNOSIS — R0600 Dyspnea, unspecified: Secondary | ICD-10-CM | POA: Diagnosis not present

## 2019-04-22 DIAGNOSIS — I5022 Chronic systolic (congestive) heart failure: Secondary | ICD-10-CM | POA: Diagnosis not present

## 2019-04-22 DIAGNOSIS — R0609 Other forms of dyspnea: Secondary | ICD-10-CM

## 2019-04-22 MED ORDER — ENTRESTO 24-26 MG PO TABS: 1.0000 | ORAL_TABLET | Freq: Two times a day (BID) | ORAL | 2 refills | Status: DC

## 2019-04-22 NOTE — Telephone Encounter (Signed)
Pt was seen in hypertension clinic earlier today.  Reviewed new changes with [name], RPH.  Pt should take Entresto at 7 AM and 11 PM.  Start when he is due for next dose Irbesartan.  I spoke with pt and reviewed echo results and recommendations from Dr Turner/pharmacist with pt.  Will send Entresto prescription to [facility] on [address]. He will come to office for hypertension clinic appt on November 6,2020 at 9:30. Lab work to be done at this time. I told him our office would contact him with appointment for cardiac MRI

## 2019-04-22 NOTE — Telephone Encounter (Signed)
-----   Message from Matthew Margarita, MD sent at 04/22/2019  4:02 PM EDT ----- Echo showed moderately reduced LVF with EF 30-35% with mild LAE.  Last echo was 40-45%.  Please set patient up for cardiac MRI for better assessment of LVF.  Change Irbesartan to Entresto 24-26mg  BID and followup in 1 week in HTN clinic for uptitration and BMET

## 2019-04-22 NOTE — Telephone Encounter (Signed)
Discussed requested changes with Matthew Nixon. She will instruct patient to start taking Entresto at the time his next irbasartan is due. He will take at 7 AM and 11 Pm based off of the schedule made today in clinic with patient. Repeat BMP and check BP in clinic in 1 week.

## 2019-04-25 ENCOUNTER — Telehealth: Payer: Self-pay | Admitting: Cardiology

## 2019-04-25 NOTE — Telephone Encounter (Signed)
Pt has been on Irbesartan. I returned call and told Bunnie Pion pt has been on Irbesartan. He instructed me to call [phone] to initiate prior authorization.  Documentation will need to be faxed. I called this number and gave requested information. Paperwork for prior authorization will be faxed to our office. Will forward to hypertension clinic regarding follow up appointment

## 2019-04-25 NOTE — Telephone Encounter (Signed)
New Message:  Pt insurance will not pay for his Entresto. He can use of these 4 medicine, Candesartan, Lisinopril,  Enalapril/HCTZ and  Irbesartan. Pt only have to try 1 of these for 30 days and fail. Please call whatever is decided to [facility] on [address], [place], [place]

## 2019-04-25 NOTE — Telephone Encounter (Signed)
Agree would wait to change meds until he gets back in the [place]

## 2019-04-25 NOTE — Telephone Encounter (Signed)
Patient informed that we will wait until he returns to the [place] to start [facility].  Lyn- please still complete the PA as we will have it done for when he returns.  Patient instructed to start taking his medications consistently as discussed in PharmD clinic on Friday. amlodipine 10mg  daily  - 7 AM spironolactone 25 mg daily - 7 AM carvedilol25mg  twice daily - 7 AM and 11 PM isosorbide mononitrate 60 mg twice daily - 7 AM and 11PM irbesartan 300 mg daily - 11 PM doxazosin 8mg  daily  - 11 PM  hydralazine to 100mg  three times daily - 7AM, 3PM, 11PM  I will cancel his appointment with [place] on Friday and patient is to call us when he returns (might be about 3 months per patient)

## 2019-04-25 NOTE — Telephone Encounter (Signed)
Patient is leaving to go to the [place] for the winter on Nov 16. He is concerned about starting this medication due to his hx of kidney issues. I did explain to him that this is the best medication for his heart failure. However, I am concerned about our ability to properly monitor his renal function and titrate the dose since he will be leaving the country on the 16th. Medication is not approved yet through [employer] and he would need a 90 days supply to get him through. Might be best to wait until he returns to start, unless we can get approved in the next few days and get labs next week before he leaves. Titration to higher dose probably wont be possible before he leaves. I will route to Dr. Radford Pax for her input.

## 2019-04-26 ENCOUNTER — Encounter: Payer: Self-pay | Admitting: Cardiology

## 2019-04-26 DIAGNOSIS — G4733 Obstructive sleep apnea (adult) (pediatric): Secondary | ICD-10-CM | POA: Diagnosis not present

## 2019-04-26 DIAGNOSIS — J342 Deviated nasal septum: Secondary | ICD-10-CM | POA: Insufficient documentation

## 2019-04-26 DIAGNOSIS — J343 Hypertrophy of nasal turbinates: Secondary | ICD-10-CM | POA: Diagnosis not present

## 2019-04-26 NOTE — Telephone Encounter (Signed)
Yes, I called [facility] and they state they have no Ins info at all on the pt. All they have is his benecard which is what the [facility] PA request came to me under.  I will try the info from his UMR card but Im not even sure a PA is needed if the pt did not present his ins card to his pharmacy.  I have called the pt multiple times since yesterday but cannot reach him. I left a message on his VM asking him to call me back.

## 2019-04-26 NOTE — Telephone Encounter (Addendum)
The pt states that he explained to a someone at Dr Landis Gandy office this morning that he does not want to take [facility] and will remain on the medications that he is currently taking now. He states that he is leaving soon going to the [place] and does not know when he will return.  He did verify that [place] is his ins provider but he is requesting that I not pursue this [facility] PA as he is not interested in it at this time.  I explained to him that I am going to try to take care of a prior authorization through his Ins provider now so that when he does return to the [place] he will be able to immediately start taking Entresto and again he stated:  "Please do not go any further with Entresto because I dont know when I will be back and Im good with what Im taking now". He is advised to contact Dr Landis Gandy office as soon as he returns to the [place] so we can handle his [place].  He verbalized understanding and thanked me for calling him.

## 2019-04-26 NOTE — Telephone Encounter (Signed)
Follow up   Patient is returning call. He states that the medication was cancelled yesterday by Dr. Radford Pax.

## 2019-04-26 NOTE — Telephone Encounter (Signed)
The pts pharmacy, [facility] states that all they have on file for the pt is his Benecard and no ins at all. I have no way of doing a Entresto PA without his pharmacy having his updated ins info.  Please advise.

## 2019-04-26 NOTE — Telephone Encounter (Signed)
Even though the pt does not want me to pursue this Entresto PA at this time I did attempt a Entresto PA using the info from the [place] card that was scanned into the pts chart on 03/30/18 through covermymeds and received the following message: No requests were found Verify the patient insurance state and/or Use an alternate name or spelling  I verified the pts ins state and tried variations of his name multiple times without success.   I then called the phone number listed earlier in this phone message [phone] [facility]) and s/w Prince Solian who advised me that a form was faxed to the office at [phone] concerning the pts [place] PA. I advised her that fax number is incorrect and asked her to fax [place] another form to [phone] and she stated that she will.   I asked Prince Solian if I could do the PA over the phone and she stated no that the form will have to be filled out and faxed back to them.  We will address the form once we receive it via fax.

## 2019-04-26 NOTE — Telephone Encounter (Signed)
lyn -Did you try the UMR card from 03/30/18? Or you might have to call patient. Are you calling [facility]?

## 2019-04-27 ENCOUNTER — Telehealth: Payer: Self-pay | Admitting: *Deleted

## 2019-04-27 NOTE — Telephone Encounter (Signed)
[facility] called they stated general anesthesia is going to be used.

## 2019-04-27 NOTE — Telephone Encounter (Signed)
I have s/w pt per Kathyrn Drown, NP that appt should be after he has MRI done. I s/w pt and he said he wants to cancel all MRI and provider appt as well as he is needing to be out of town for the next 2-3 months. Pt states he will call when he gets back and will reschedule MRI and appt with the Cardiologist. He states he really is trying to leave for his vacation as soon as he can. He has opted to cancel appts and will reschedule when he is back. I will let the surgeon's office know as well. I am going to remove this from the pre op call back pool.

## 2019-04-27 NOTE — Telephone Encounter (Signed)
   Primary Cardiologist:Traci Turner, MD  Chart reviewed as part of pre-operative protocol coverage. Because of Matthew Nixon's past medical history and time since last visit, he/she will require a follow-up visit in order to better assess preoperative cardiovascular risk.  He was last seen by Dr. Radford Pax and was having symptoms of DOE. Echo performed which showed reduced LV function. He is scheduled for cardiac MRI for better assessment and follow up with Melina Copa, PA-C 05/04/19. Please add cardiac clearance to pre-op evaluation and make provider aware.   Pre-op covering staff: - Please add pre-op evaluation to appointment 11/11 and let provider know. schedule appointment and call patient to inform them. Any way to move cardiac MRI earlier than appointment? Appears cMRI is 11/16 - Please contact requesting surgeon's office via preferred method (i.e, phone, fax) to inform them of need for appointment prior to surgery.  If applicable, this message will also be routed to pharmacy pool and/or primary cardiologist for input on holding anticoagulant/antiplatelet agent as requested below so that this information is available at time of patient's appointment.   Kathyrn Drown, NP  04/27/2019, 2:49 PM

## 2019-04-27 NOTE — Telephone Encounter (Signed)
    [facility] Pre-operative Risk Assessment    Request for surgical clearance:  1. What type of surgery is being performed? SEPTOPLASTY/TURBINATE REDUCTION   2. When is this surgery scheduled? TBD   3. What type of clearance is required (medical clearance vs. Pharmacy clearance to hold med vs. Both)? MEDICAL  4. Are there any medications that need to be held prior to surgery and how long? ASA    5. Practice name and name of physician performing surgery? [facility]; DR. DAVID SHOEMAKER   6. What is your office phone number [phone]    7.   What is your office fax number [phone]  8.   Anesthesia type (None, local, MAC, general) ? NOT LISTED; I DID LEAVE MESSAGE TO CONFIRM TYPE OF ANESTHESIA   Julaine Hua 04/27/2019, 2:32 PM  _________________________________________________________________   (provider comments below)

## 2019-04-28 NOTE — Telephone Encounter (Signed)
   Primary Cardiologist: Fransico Him, MD  Chart reviewed as part of pre-operative protocol coverage.  Matthew Nixon was last seen on 04/13/2019 by Dr. Fransico Him, MD. At that time, he was having symptoms in which an echocardiogram was performed. This showed slightly worse LVEF and therefore a follow up cMRI was ordered. When contacted for pre-op clearance appointment, he stated that he was going out of town for 2-3 months and would need to cancel his cMRI and f/u appointment. He will need the cMRI prior to surgery for cardiac clearance to be granted.    Pre-op covering staff: - Please contact requesting surgeon's office via preferred method (i.e, phone, fax) to inform them of need for cMRI to be performed prior to surgery.   Kathyrn Drown, NP 04/28/2019, 10:17 AM

## 2019-04-28 NOTE — Telephone Encounter (Signed)
I have faxed notes to Dr. Wilburn Cornelia.

## 2019-04-29 ENCOUNTER — Other Ambulatory Visit: Payer: Medicare Other

## 2019-04-29 ENCOUNTER — Ambulatory Visit: Payer: Medicare Other

## 2019-05-04 ENCOUNTER — Telehealth: Payer: Medicare Other | Admitting: Physician Assistant

## 2019-05-05 ENCOUNTER — Ambulatory Visit: Payer: Medicare Other | Admitting: Cardiology

## 2019-05-09 ENCOUNTER — Other Ambulatory Visit (HOSPITAL_COMMUNITY): Payer: Medicare Other

## 2019-05-18 NOTE — Telephone Encounter (Signed)
Follow Up  [facility] from [facility] is calling in to make Dr. Radford Pax aware that the claim for Delene Loll was denied due to needs to have tried one of the alternatives and failed. The alternatives include Candesartan, Irbesartan, Lisinopril, and a combo of Enalapril/Hydrochlorothiazide HCTZ. Bunnie Pion states that one of these would have to be tried for 30 days and then prior authorization would be needed for the [facility]. If any needed to, Terrial Rhodes can be called back at [phone].

## 2019-05-23 NOTE — Telephone Encounter (Signed)
I would wait until patient returns to the [place] to discuss options with patient

## 2019-05-24 NOTE — Telephone Encounter (Signed)
Agree 

## 2019-05-30 ENCOUNTER — Encounter: Payer: Self-pay | Admitting: Cardiology

## 2019-05-30 ENCOUNTER — Telehealth: Payer: Self-pay | Admitting: *Deleted

## 2019-05-30 NOTE — Telephone Encounter (Signed)
Left message regarding appointment for Cardiac MRI scheduled Monday 06/20/19 at 3:00pm at [facility].  Arrival time 2:15pm 1st floor admission office at [facility].  Will also mail information to patient

## 2019-06-15 ENCOUNTER — Telehealth (HOSPITAL_COMMUNITY): Payer: Self-pay | Admitting: Emergency Medicine

## 2019-06-15 NOTE — Telephone Encounter (Signed)
Left message on voicemail with name and callback number Annalia Metzger RN Navigator Cardiac Imaging [facility] [phone] Office [phone] Cell  

## 2019-06-20 ENCOUNTER — Ambulatory Visit (HOSPITAL_COMMUNITY): Admission: RE | Admit: 2019-06-20 | Payer: Medicare Other | Source: Ambulatory Visit

## 2019-09-30 DIAGNOSIS — H31092 Other chorioretinal scars, left eye: Secondary | ICD-10-CM | POA: Diagnosis not present

## 2019-09-30 DIAGNOSIS — H33322 Round hole, left eye: Secondary | ICD-10-CM | POA: Diagnosis not present

## 2019-09-30 DIAGNOSIS — D3132 Benign neoplasm of left choroid: Secondary | ICD-10-CM | POA: Diagnosis not present

## 2019-09-30 DIAGNOSIS — H35372 Puckering of macula, left eye: Secondary | ICD-10-CM | POA: Diagnosis not present

## 2019-10-06 ENCOUNTER — Other Ambulatory Visit: Payer: Self-pay

## 2019-10-07 ENCOUNTER — Ambulatory Visit (INDEPENDENT_AMBULATORY_CARE_PROVIDER_SITE_OTHER): Payer: Medicare Other | Admitting: Medical

## 2019-10-07 ENCOUNTER — Other Ambulatory Visit: Payer: Self-pay

## 2019-10-07 ENCOUNTER — Encounter: Payer: Self-pay | Admitting: Medical

## 2019-10-07 VITALS — BP 168/80 | HR 78 | Temp 97.4°F | Ht 69.0 in | Wt 197.6 lb

## 2019-10-07 DIAGNOSIS — R413 Other amnesia: Secondary | ICD-10-CM

## 2019-10-07 DIAGNOSIS — I10 Essential (primary) hypertension: Secondary | ICD-10-CM | POA: Diagnosis not present

## 2019-10-07 DIAGNOSIS — L80 Vitiligo: Secondary | ICD-10-CM | POA: Diagnosis not present

## 2019-10-07 LAB — COMPREHENSIVE METABOLIC PANEL
ALT: 36 U/L (ref 0–53)
AST: 22 U/L (ref 0–37)
Albumin: 4.5 g/dL (ref 3.5–5.2)
Alkaline Phosphatase: 72 U/L (ref 39–117)
BUN: 18 mg/dL (ref 6–23)
CO2: 31 mEq/L (ref 19–32)
Calcium: 10 mg/dL (ref 8.4–10.5)
Chloride: 102 mEq/L (ref 96–112)
Creatinine, Ser: 0.98 mg/dL (ref 0.40–1.50)
GFR: 78.06 mL/min (ref >60.00)
Glucose, Bld: 92 mg/dL (ref 70–99)
Potassium: 4.1 mEq/L (ref 3.5–5.1)
Sodium: 140 mEq/L (ref 135–145)
Total Bilirubin: 0.5 mg/dL (ref 0.2–1.2)
Total Protein: 7.1 g/dL (ref 6.0–8.3)

## 2019-10-07 MED ORDER — ENTRESTO 24-26 MG PO TABS: 1.0000 | ORAL_TABLET | Freq: Two times a day (BID) | ORAL | 2 refills | Status: DC

## 2019-10-07 MED ORDER — AMLODIPINE BESYLATE 10 MG PO TABS: 10.0000 mg | ORAL_TABLET | Freq: Every day | ORAL | 3 refills | Status: DC

## 2019-10-07 MED ORDER — DOXAZOSIN MESYLATE 8 MG PO TABS: 8.0000 mg | ORAL_TABLET | Freq: Every day | ORAL | 3 refills | Status: DC

## 2019-10-07 MED ORDER — HYDRALAZINE HCL 100 MG PO TABS: 100.0000 mg | ORAL_TABLET | Freq: Three times a day (TID) | ORAL | 3 refills | Status: DC

## 2019-10-07 MED ORDER — FUROSEMIDE 20 MG PO TABS: 20.0000 mg | ORAL_TABLET | Freq: Every day | ORAL | 0 refills | Status: DC

## 2019-10-07 MED ORDER — SPIRONOLACTONE 25 MG PO TABS: 25.0000 mg | ORAL_TABLET | Freq: Every day | ORAL | 3 refills | Status: DC

## 2019-10-07 NOTE — Progress Notes (Signed)
Subjective:    Patient ID: Matthew Nixon, male    DOB: 1961-05-29, 59 y.o.   MRN: [id number]  HPI  Pt in for follow up.  Pt just got back from [place]   Pt concerned for memory issues. He forgot lap top computer 4 digit code the other day. Also he is forgetting some songs that he used to sing. No family history of dementia. Pt does not get lost driving to place. Pt does recognize faces and friends.    Pt blood pressure is high initially. Better on recheck.  Mini-mental status exam to today 25. Had difficulty on subtracting and try to spell world back word.   Pt has slight small white spots on his pretibial area gradually worsening.   Review of Systems  Constitutional: Negative for chills, fatigue and fever.  Respiratory: Negative for cough, chest tightness and shortness of breath.   Cardiovascular: Negative for chest pain and palpitations.  Gastrointestinal: Negative for abdominal pain, diarrhea, nausea and vomiting.  Musculoskeletal: Negative for back pain.  Skin: Negative for rash.  Neurological: Negative for dizziness, speech difficulty, weakness, numbness and headaches.  Hematological: Negative for adenopathy. Does not bruise/bleed easily.  Psychiatric/Behavioral: Negative for behavioral problems, confusion, sleep disturbance and suicidal ideas. The patient is not nervous/anxious.     Past Medical History:  Diagnosis Date  . Allergy   . Cardiomyopathy ([place])    DCM with EF 32% on echo 2014 and no ischemia on nuclear stress test.  EF improved to 40-45% by echo 02/2017  . Chronic back pain    low back  . Chronic systolic CHF (congestive heart failure), NYHA class 2 ([place]) 08/07/2016  . COPD (chronic obstructive pulmonary disease) ([place])   . Dyspnea    with exertion  . EKG, abnormal    history of left bundle block since 2002 per pt  . GERD (gastroesophageal reflux disease)   . Headache   . Hypertension   . LBBB (left bundle branch block)   . OSA on CPAP  08/07/2016  . Oxygen deficiency   . Pain    right side pain for many years  . PTSD (post-traumatic stress disorder)   . Sleep apnea      Social History   Socioeconomic History  . Marital status: Married    Spouse name: Not on file  . Number of children: Not on file  . Years of education: Not on file  . Highest education level: Not on file  Occupational History  . Not on file  Tobacco Use  . Smoking status: Never Smoker  . Smokeless tobacco: Never Used  Substance and Sexual Activity  . Alcohol use: No  . Drug use: No  . Sexual activity: Not on file  Other Topics Concern  . Not on file  Social History Narrative   2 years college    Social Determinants of Health   Financial Resource Strain:   . Difficulty of Paying Living Expenses:   Food Insecurity:   . Worried About Charity fundraiser in the Last Year:   . Arboriculturist in the Last Year:   Transportation Needs:   . Film/video editor (Medical):   [name] Lack of Transportation (Non-Medical):   Physical Activity:   . Days of Exercise per Week:   . Minutes of Exercise per Session:   Stress:   . Feeling of Stress :   Social Connections:   . Frequency of Communication with Friends and Family:   .  Frequency of Social Gatherings with Friends and Family:   . Attends Religious Services:   . Active Member of Clubs or Organizations:   . Attends Archivist Meetings:   [name] Marital Status:   Intimate Partner Violence:   . Fear of Current or Ex-Partner:   . Emotionally Abused:   [name] Physically Abused:   . Sexually Abused:     Past Surgical History:  Procedure Laterality Date  . ANKLE SURGERY Right   . CARDIAC CATHETERIZATION  yrs ago  . ESOPHAGOGASTRODUODENOSCOPY (EGD) WITH PROPOFOL N/A 01/13/2017   Procedure: ESOPHAGOGASTRODUODENOSCOPY (EGD) WITH PROPOFOL;  Surgeon: Irene Shipper, MD;  Location: [facility];  Service: Endoscopy;  Laterality: N/A;  . HAND SURGERY     Right  . HAND SURGERY     Left carpel  tunnel  . THUMB ARTHROSCOPY Left     Family History  Problem Relation Age of Onset  . Heart disease Mother   . Heart disease Father   . Colon cancer Neg Hx   . Esophageal cancer Neg Hx   . Pancreatic cancer Neg Hx   . Stomach cancer Neg Hx     No Known Allergies  Current Outpatient Medications on File Prior to Visit  Medication Sig Dispense Refill  . albuterol (PROVENTIL HFA;VENTOLIN HFA) 108 (90 Base) MCG/ACT inhaler Inhale 2 puffs into the lungs every 6 (six) hours as needed for wheezing or shortness of breath. 1 Inhaler 0  . amLODipine (NORVASC) 10 MG tablet Take 10 mg by mouth daily.    [name] aspirin 81 MG tablet Take 81 mg by mouth daily.    . budesonide-formoterol (SYMBICORT) 80-4.5 MCG/ACT inhaler Inhale 2 puffs into the lungs 2 (two) times daily. 1 Inhaler 3  . carvedilol (COREG) 25 MG tablet Take 25 mg by mouth 2 (two) times daily with a meal.    . cetirizine (ZYRTEC) 10 MG tablet Take 10 mg by mouth daily.    . diclofenac sodium (VOLTAREN) 1 % GEL Apply 2 g topically 4 (four) times daily. 3 Tube 2  . digoxin (LANOXIN) 0.125 MG tablet Take 0.125 mg by mouth daily.     [name] doxazosin (CARDURA) 8 MG tablet Take 8 mg by mouth daily.    . fluticasone (FLONASE) 50 MCG/ACT nasal spray Place 2 sprays into both nostrils daily. 16 g 1  . furosemide (LASIX) 20 MG tablet Take 1 tablet (20 mg total) by mouth daily. 14 tablet 0  . hydrALAZINE (APRESOLINE) 100 MG tablet Take 1 tablet (100 mg total) by mouth 3 (three) times daily. 270 tablet 3  . hydrOXYzine (ATARAX/VISTARIL) 10 MG tablet 1-2 tab po q 8 hours as needed itching. 30 tablet 0  . ipratropium (ATROVENT HFA) 17 MCG/ACT inhaler Inhale 2 puffs into the lungs every 6 (six) hours as needed for wheezing. 1 Inhaler 3  . ISOSORBIDE MONONITRATE PO Take 60 mg by mouth 2 (two) times daily.    . magic mouthwash SOLN 5 ml po qid swish and spit. 200 mL 0  . meclizine (ANTIVERT) 12.5 MG tablet Take 1 tablet (12.5 mg total) by mouth 3 (three) times  daily as needed for dizziness. 30 tablet 0  . mometasone-formoterol (DULERA) 200-5 MCG/ACT AERO Inhale 2 puffs into the lungs 2 (two) times daily. 13 g 1  . montelukast (SINGULAIR) 10 MG tablet Take 1 tablet (10 mg total) by mouth at bedtime. 30 tablet 3  . omeprazole (PRILOSEC) 40 MG capsule Take 1 capsule (40 mg total) by mouth  daily. 30 capsule 3  . ondansetron (ZOFRAN ODT) 8 MG disintegrating tablet Take 1 tablet (8 mg total) by mouth every 8 (eight) hours as needed for nausea or vomiting. 20 tablet 0  . ranitidine (ZANTAC) 150 MG capsule Take 1 capsule (150 mg total) by mouth 2 (two) times daily. 60 capsule 6  . sacubitril-valsartan (ENTRESTO) 24-26 MG Take 1 tablet by mouth 2 (two) times daily. 60 tablet 2  . spironolactone (ALDACTONE) 25 MG tablet Take 25 mg by mouth daily.    [name] tobramycin (TOBREX) 0.3 % ophthalmic solution Place 2 drops into the left eye every 6 (six) hours. (Patient not taking: Reported on 10/07/2019) 5 mL 0  . traMADol (ULTRAM) 50 MG tablet 1-2 every 4 hours as needed for cough or pain 40 tablet 0   No current facility-administered medications on file prior to visit.    BP (!) 193/74 (BP Location: Left Arm, Patient Position: Sitting, Cuff Size: Large) Comment: 171/75  Pulse 78   Temp (!) 97.4 F (36.3 C) (Temporal)   Ht 5\' 9"  (1.753 m)   Wt 197 lb 9.6 oz (89.6 kg)   SpO2 99%   BMI 29.18 kg/m       Objective:   Physical Exam  General Mental Status- Alert. General Appearance- Not in acute distress.   Skin General: Color- Normal Color. Moisture- Normal Moisture.  Neck Carotid Arteries- Normal color. Moisture- Normal Moisture. No carotid bruits. No JVD.  Chest and Lung Exam Auscultation: Breath Sounds:-Normal.  Cardiovascular Auscultation:Rythm- Regular. Murmurs & Other Heart Sounds:Auscultation of the heart reveals- No Murmurs.  Abdomen Inspection:-Inspeection Normal. Palpation/Percussion:Note:No mass. Palpation and Percussion of the abdomen  reveal- Non Tender, Non Distended + BS, no rebound or guarding.   Neurologic Cranial Nerve exam:- CN III-XII intact(No nystagmus), symmetric smile. Strength:- 5/5 equal and symmetric strength both upper and lower extremities.  Lower ext- both pretibial area shows scatter hypopigmented spots. No pedal edema.    Assessment & Plan:   Your blood pressure is high today.  Continue current BP medication regimen.  Keep follow-up appointment with your cardiologist in [place] in approximately 1 month.  I want you to go ahead and check your blood pressure couple times a day and MyChart me BP readings in 1 week.  Historically when your weight is controlled and the weather is warmer your blood pressure trends still run lower.  If your blood pressures getting higher then might need to make adjustment to your current regimen.  Your Mini-Mental status score today was 25 out of 30.  Discussed potential referral to neurologist but you want to wait and see how you do on a day-to-day basis.  If memory changes/difficulty occurring then just MyChart me and I will make referral to neurologist for further testing.  On exam it does appear that you have probable vitiligo.  I went ahead and placed dermatology referral.  They will call you when you can coordinate date of that appointment.   Pt checking on isosorbide dose that he is using. He thinks 60 mg not correct.   Check if cardiologist check if spirinolactone ok with entresto.  Follow-up in 3 months or as needed.  Mackie Pai, PA-C   Time spent with patient today was  30  minutes which consisted of chart review, discussing diagnoses, work up, treatment, referrals and documentation.

## 2019-10-07 NOTE — Patient Instructions (Addendum)
Your blood pressure is high today.  Continue current BP medication regimen.  Keep follow-up appointment with your cardiologist in [place] in approximately 1 month.  I want you to go ahead and check your blood pressure couple times a day and MyChart me BP readings in 1 week.  Historically when your weight is controlled and the weather is warmer your blood pressure trends still run lower.  If your blood pressures getting higher then might need to make adjustment to your current regimen.  Your Mini-Mental status score today was 25 out of 30.  Discussed potential referral to neurologist but you want to wait and see how you do on a day-to-day basis.  If memory changes/difficulty occurring then just MyChart me and I will make referral to neurologist for further testing.  On exam it does appear that you have probable vitiligo.  I went ahead and placed dermatology referral.  They will call you when you can coordinate date of that appointment.   Pt checking on isosorbide dose that he is using. He thinks 60 mg not correct.   Check if cardiologist check if spirinolactone ok with entresto.  Follow-up in 3 months or as needed.

## 2019-10-10 ENCOUNTER — Telehealth: Payer: Self-pay

## 2019-10-10 NOTE — Telephone Encounter (Signed)
PA form received/completed and faxed to [facility] at [phone]. Awaiting determination.

## 2019-10-11 NOTE — Telephone Encounter (Signed)
Called [employer]- PA still in process. They are needing OV notes--informed I'd refax tomorrow when back in office.

## 2019-10-12 NOTE — Telephone Encounter (Signed)
PA denied. Requesting cardiology notes w/ proof of step 1 failure. PA denial appealed and faxed w/ most recent cardio notes to [phone]. Awaiting determination.

## 2019-10-14 DIAGNOSIS — H33322 Round hole, left eye: Secondary | ICD-10-CM | POA: Diagnosis not present

## 2019-10-18 NOTE — Telephone Encounter (Signed)
Appeal information faxed again today.

## 2019-10-25 NOTE — Telephone Encounter (Signed)
Matthew Nixon- I have not heard back from PA appeal regarding Pt's Entresto.

## 2019-10-25 NOTE — Telephone Encounter (Signed)
With his history he should be on the medication. I did not rx it for him in past. His cardiologist did. Ask him to look at who was original prescriber. Then have him call office and get them to write the rx. If prior auth issues cardiologist should be able to push it thru.

## 2019-11-08 DIAGNOSIS — E669 Obesity, unspecified: Secondary | ICD-10-CM | POA: Insufficient documentation

## 2019-11-08 DIAGNOSIS — I509 Heart failure, unspecified: Secondary | ICD-10-CM | POA: Insufficient documentation

## 2019-11-08 DIAGNOSIS — I340 Nonrheumatic mitral (valve) insufficiency: Secondary | ICD-10-CM | POA: Insufficient documentation

## 2019-11-14 ENCOUNTER — Ambulatory Visit: Payer: Medicare Other | Admitting: Medical

## 2019-11-14 DIAGNOSIS — Z0289 Encounter for other administrative examinations: Secondary | ICD-10-CM

## 2019-11-15 ENCOUNTER — Encounter: Payer: Self-pay | Admitting: Medical

## 2019-11-15 ENCOUNTER — Ambulatory Visit (INDEPENDENT_AMBULATORY_CARE_PROVIDER_SITE_OTHER): Payer: Medicare Other | Admitting: Medical

## 2019-11-15 ENCOUNTER — Other Ambulatory Visit: Payer: Self-pay

## 2019-11-15 VITALS — BP 150/85 | HR 80 | Temp 98.2°F | Ht 69.0 in | Wt 192.6 lb

## 2019-11-15 DIAGNOSIS — Z125 Encounter for screening for malignant neoplasm of prostate: Secondary | ICD-10-CM | POA: Diagnosis not present

## 2019-11-15 DIAGNOSIS — R6882 Decreased libido: Secondary | ICD-10-CM | POA: Diagnosis not present

## 2019-11-15 DIAGNOSIS — J029 Acute pharyngitis, unspecified: Secondary | ICD-10-CM | POA: Diagnosis not present

## 2019-11-15 DIAGNOSIS — Z1211 Encounter for screening for malignant neoplasm of colon: Secondary | ICD-10-CM

## 2019-11-15 DIAGNOSIS — N5319 Other ejaculatory dysfunction: Secondary | ICD-10-CM

## 2019-11-15 DIAGNOSIS — I10 Essential (primary) hypertension: Secondary | ICD-10-CM

## 2019-11-15 DIAGNOSIS — L739 Follicular disorder, unspecified: Secondary | ICD-10-CM | POA: Diagnosis not present

## 2019-11-15 DIAGNOSIS — R5383 Other fatigue: Secondary | ICD-10-CM | POA: Diagnosis not present

## 2019-11-15 MED ORDER — CEPHALEXIN 500 MG PO CAPS: 500.0000 mg | ORAL_CAPSULE | Freq: Two times a day (BID) | ORAL | 0 refills | Status: DC

## 2019-11-15 NOTE — Patient Instructions (Signed)
Your blood pressure is a little bit elevated today.  Better on recheck.  Historically blood pressure better in the summer.  Recently followed up with your cardiologist in [place] 1 week ago.  Continue current heart/BP medication regimen.  For screening colon cancer, placed order for repeat Cologuard.  For screening prostate cancer, did place future order PSA.  For recent abnormal ejaculation as described, went ahead and put in referral to urologist.  Asking if they can see you within the next 5 to 6 weeks before you go on vacation.  You do have some mild scalp folliculitis and you have some intermittent white discharge from your tonsils.  On today's exam on tonsils look a little bit pinkish-red but not inflamed and no active discharge presently.  Will prescribe Keflex antibiotic.  If in the future you have tonsillar asymmetry or persistent swollen tonsils then will refer you to ENT.  For mild low libido, mild fatigue and abnormal regulation, went ahead and put in future order testosterone panel.  Please get scheduled for early morning labs.  Follow up in 3-4 weeks or as needed

## 2019-11-15 NOTE — Progress Notes (Signed)
Subjective:    Patient ID: Matthew Nixon, male    DOB: 11/21/1960, 59 y.o.   MRN: [id number]  HPI  Pt in stating he has some issues recently.  He states he is seeing some slight discharge from tonsils.  Pt states recently on Tuesday last week he ejaculated but was dry no semen/ejaculate. Pt states no problems getting erection. He states no problems with energy.   Pt has bumps on his scalp area over past week.  Pt has htn. His bp yesterday was 152/81. 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 He has better bp in summer historically.  Pt did see his cardiologist in [place] last Monday.   Pt has not gotten covid vaccine. (his cardiologist did recommend).    Review of Systems  Constitutional: Negative for chills and fatigue.  Respiratory: Negative for cough, chest tightness, shortness of breath and wheezing.   Cardiovascular: Negative for chest pain and palpitations.  Gastrointestinal: Negative for abdominal pain, diarrhea and nausea.  Genitourinary: Negative for dysuria.       Dry ejaculation twice.  Musculoskeletal: Negative for back pain, myalgias and neck stiffness.  Neurological: Negative for dizziness, speech difficulty, weakness, numbness and headaches.  Hematological: Negative for adenopathy. Does not bruise/bleed easily.  Psychiatric/Behavioral: Negative for behavioral problems and confusion.    Past Medical History:  Diagnosis Date  . Allergy   . Cardiomyopathy ([place])    DCM with EF 32% on echo 2014 and no ischemia on nuclear stress test.  EF improved to 40-45% by echo 02/2017  . Chronic back pain    low back  . Chronic systolic CHF (congestive heart failure), NYHA class 2 ([place]) 08/07/2016  . COPD (chronic obstructive pulmonary disease) ([place])   . Dyspnea    with exertion  . EKG, abnormal    history of left bundle block since 2002 per pt  . GERD (gastroesophageal reflux disease)   . Headache   . Hypertension   . LBBB (left  bundle branch block)   . OSA on CPAP 08/07/2016  . Oxygen deficiency   . Pain    right side pain for many years  . PTSD (post-traumatic stress disorder)   . Sleep apnea      Social History   Socioeconomic History  . Marital status: Married    Spouse name: Not on file  . Number of children: Not on file  . Years of education: Not on file  . Highest education level: Not on file  Occupational History  . Not on file  Tobacco Use  . Smoking status: Never Smoker  . Smokeless tobacco: Never Used  Substance and Sexual Activity  . Alcohol use: No  . Drug use: No  . Sexual activity: Not on file  Other Topics Concern  . Not on file  Social History Narrative   2 years college    Social Determinants of Health   Financial Resource Strain:   . Difficulty of Paying Living Expenses:   Food Insecurity:   . Worried About Charity fundraiser in the Last Year:   . Arboriculturist in the Last Year:   Transportation Needs:   . Film/video editor (Medical):   [name] Lack of Transportation (Non-Medical):   Physical Activity:   . Days of Exercise per Week:   . Minutes of Exercise per Session:   Stress:   . Feeling of Stress :   Social Connections:   . Frequency of  Communication with Friends and Family:   . Frequency of Social Gatherings with Friends and Family:   . Attends Religious Services:   . Active Member of Clubs or Organizations:   . Attends Archivist Meetings:   [name] Marital Status:   Intimate Partner Violence:   . Fear of Current or Ex-Partner:   . Emotionally Abused:   [name] Physically Abused:   . Sexually Abused:     Past Surgical History:  Procedure Laterality Date  . ANKLE SURGERY Right   . CARDIAC CATHETERIZATION  yrs ago  . ESOPHAGOGASTRODUODENOSCOPY (EGD) WITH PROPOFOL N/A 01/13/2017   Procedure: ESOPHAGOGASTRODUODENOSCOPY (EGD) WITH PROPOFOL;  Surgeon: Irene Shipper, MD;  Location: [facility];  Service: Endoscopy;  Laterality: N/A;  . HAND SURGERY     Right   . HAND SURGERY     Left carpel tunnel  . THUMB ARTHROSCOPY Left     Family History  Problem Relation Age of Onset  . Heart disease Mother   . Heart disease Father   . Colon cancer Neg Hx   . Esophageal cancer Neg Hx   . Pancreatic cancer Neg Hx   . Stomach cancer Neg Hx     No Known Allergies  Current Outpatient Medications on File Prior to Visit  Medication Sig Dispense Refill  . albuterol (PROVENTIL HFA;VENTOLIN HFA) 108 (90 Base) MCG/ACT inhaler Inhale 2 puffs into the lungs every 6 (six) hours as needed for wheezing or shortness of breath. 1 Inhaler 0  . amLODipine (NORVASC) 10 MG tablet Take 1 tablet (10 mg total) by mouth daily. 90 tablet 3  . aspirin 81 MG tablet Take 81 mg by mouth daily.    . budesonide-formoterol (SYMBICORT) 80-4.5 MCG/ACT inhaler Inhale 2 puffs into the lungs 2 (two) times daily. 1 Inhaler 3  . carvedilol (COREG) 25 MG tablet Take 25 mg by mouth 2 (two) times daily with a meal.    . cetirizine (ZYRTEC) 10 MG tablet Take 10 mg by mouth daily.    . diclofenac sodium (VOLTAREN) 1 % GEL Apply 2 g topically 4 (four) times daily. 3 Tube 2  . digoxin (LANOXIN) 0.125 MG tablet Take 0.125 mg by mouth daily.     [name] doxazosin (CARDURA) 8 MG tablet Take 1 tablet (8 mg total) by mouth daily. 90 tablet 3  . fluticasone (FLONASE) 50 MCG/ACT nasal spray Place 2 sprays into both nostrils daily. 16 g 1  . furosemide (LASIX) 20 MG tablet Take 1 tablet (20 mg total) by mouth daily. 14 tablet 0  . hydrALAZINE (APRESOLINE) 100 MG tablet Take 1 tablet (100 mg total) by mouth 3 (three) times daily. 270 tablet 3  . hydrOXYzine (ATARAX/VISTARIL) 10 MG tablet 1-2 tab po q 8 hours as needed itching. 30 tablet 0  . ipratropium (ATROVENT HFA) 17 MCG/ACT inhaler Inhale 2 puffs into the lungs every 6 (six) hours as needed for wheezing. 1 Inhaler 3  . ISOSORBIDE MONONITRATE PO Take 60 mg by mouth 2 (two) times daily.    . magic mouthwash SOLN 5 ml po qid swish and spit. 200 mL 0  .  meclizine (ANTIVERT) 12.5 MG tablet Take 1 tablet (12.5 mg total) by mouth 3 (three) times daily as needed for dizziness. 30 tablet 0  . mometasone-formoterol (DULERA) 200-5 MCG/ACT AERO Inhale 2 puffs into the lungs 2 (two) times daily. 13 g 1  . montelukast (SINGULAIR) 10 MG tablet Take 1 tablet (10 mg total) by mouth at bedtime. 30 tablet  3  . omeprazole (PRILOSEC) 40 MG capsule Take 1 capsule (40 mg total) by mouth daily. 30 capsule 3  . ondansetron (ZOFRAN ODT) 8 MG disintegrating tablet Take 1 tablet (8 mg total) by mouth every 8 (eight) hours as needed for nausea or vomiting. 20 tablet 0  . ranitidine (ZANTAC) 150 MG capsule Take 1 capsule (150 mg total) by mouth 2 (two) times daily. 60 capsule 6  . sacubitril-valsartan (ENTRESTO) 24-26 MG Take 1 tablet by mouth 2 (two) times daily. 60 tablet 2  . spironolactone (ALDACTONE) 25 MG tablet Take 1 tablet (25 mg total) by mouth daily. 90 tablet 3  . tobramycin (TOBREX) 0.3 % ophthalmic solution Place 2 drops into the left eye every 6 (six) hours. 5 mL 0  . traMADol (ULTRAM) 50 MG tablet 1-2 every 4 hours as needed for cough or pain 40 tablet 0   No current facility-administered medications on file prior to visit.    BP (!) 190/94 (BP Location: Right Arm, Patient Position: Sitting, Cuff Size: Large)   Pulse 80   Temp 98.2 F (36.8 C) (Temporal)   Ht 5\' 9"  (1.753 m)   Wt 192 lb 9.6 oz (87.4 kg)   SpO2 96%   BMI 28.44 kg/m       Objective:   Physical Exam  General Mental Status- Alert. General Appearance- Not in acute distress.   Skin Mid scatter follicles posterior scalp.  Neck Carotid Arteries- Normal color. Moisture- Normal Moisture. No carotid bruits. No JVD.  Chest and Lung Exam Auscultation: Breath Sounds:-Normal.  Cardiovascular Auscultation:Rythm- Regular. Murmurs & Other Heart Sounds:Auscultation of the heart reveals- No Murmurs.  Abdomen Inspection:-Inspeection Normal. Palpation/Percussion:Note:No mass.  Palpation and Percussion of the abdomen reveal- Non Tender, Non Distended + BS, no rebound or guarding.   Neurologic Cranial Nerve exam:- CN III-XII intact(No nystagmus), symmetric smile. Strength:- 5/5 equal and symmetric strength both upper and lower extremities.  heent- mild symmetric tonsil hypertrophy. No active dc presently.      Assessment & Plan:  Your blood pressure is a little bit elevated today.  Better on recheck.  Historically blood pressure better in the summer.  Recently followed up with your cardiologist in [place] 1 week ago.  Continue current heart/BP medication regimen.  For screening colon cancer, placed order for repeat Cologuard.  For screening prostate cancer, did place future order PSA.  For recent abnormal ejaculation as described, went ahead and put in referral to urologist.  Asking if they can see you within the next 5 to 6 weeks before you go on vacation.  You do have some mild scalp folliculitis and you have some intermittent white discharge from your tonsils.  On today's exam on tonsils look a little bit pinkish-red but not inflamed and no active discharge presently.  Will prescribe Keflex antibiotic.  If in the future you have tonsillar asymmetry or persistent swollen tonsils then will refer you to ENT.  For mild low libido, mild fatigue and abnormal regulation, went ahead and put in future order testosterone panel.  Please get scheduled for early morning labs.  Follow up in 3-4 weeks or as needed  Mackie Pai, PA-C   Time spent with patient today was 30 minutes which consisted of chart review, discussing diagnosis, work up treatment and documentation.

## 2019-11-16 ENCOUNTER — Other Ambulatory Visit (INDEPENDENT_AMBULATORY_CARE_PROVIDER_SITE_OTHER): Payer: BC Managed Care – PPO

## 2019-11-16 DIAGNOSIS — R6882 Decreased libido: Secondary | ICD-10-CM

## 2019-11-16 DIAGNOSIS — N5319 Other ejaculatory dysfunction: Secondary | ICD-10-CM

## 2019-11-16 DIAGNOSIS — Z125 Encounter for screening for malignant neoplasm of prostate: Secondary | ICD-10-CM

## 2019-11-16 DIAGNOSIS — R5383 Other fatigue: Secondary | ICD-10-CM

## 2019-11-16 LAB — PSA: PSA: 0.83 ng/mL (ref 0.10–4.00)

## 2019-11-17 ENCOUNTER — Telehealth: Payer: Self-pay | Admitting: Medical

## 2019-11-17 DIAGNOSIS — L2089 Other atopic dermatitis: Secondary | ICD-10-CM | POA: Diagnosis not present

## 2019-11-17 DIAGNOSIS — L281 Prurigo nodularis: Secondary | ICD-10-CM | POA: Diagnosis not present

## 2019-11-17 DIAGNOSIS — R7989 Other specified abnormal findings of blood chemistry: Secondary | ICD-10-CM

## 2019-11-17 DIAGNOSIS — L815 Leukoderma, not elsewhere classified: Secondary | ICD-10-CM | POA: Diagnosis not present

## 2019-11-17 DIAGNOSIS — L603 Nail dystrophy: Secondary | ICD-10-CM | POA: Diagnosis not present

## 2019-11-17 LAB — TESTOSTERONE TOTAL,FREE,BIO, MALES
Albumin: 4.3 g/dL (ref 3.6–5.1)
Sex Hormone Binding: 27 nmol/L (ref 22–77)
Testosterone, Bioavailable: 80.9 ng/dL — ABNORMAL LOW (ref >=110.0)
Testosterone, Free: 41.1 pg/mL — ABNORMAL LOW (ref 46.0–224.0)
Testosterone: 275 ng/dL (ref 250–827)

## 2019-11-17 NOTE — Telephone Encounter (Signed)
Referral to endocrinologist placed. 

## 2019-12-02 DIAGNOSIS — Z1211 Encounter for screening for malignant neoplasm of colon: Secondary | ICD-10-CM | POA: Diagnosis not present

## 2019-12-09 LAB — COLOGUARD: COLOGUARD: NEGATIVE

## 2019-12-22 ENCOUNTER — Other Ambulatory Visit: Payer: Self-pay

## 2019-12-22 ENCOUNTER — Encounter: Payer: Self-pay | Admitting: Medical

## 2019-12-22 ENCOUNTER — Ambulatory Visit (INDEPENDENT_AMBULATORY_CARE_PROVIDER_SITE_OTHER): Payer: Medicare Other | Admitting: Medical

## 2019-12-22 VITALS — BP 155/90 | HR 86 | Resp 18 | Ht 69.0 in | Wt 197.0 lb

## 2019-12-22 DIAGNOSIS — R110 Nausea: Secondary | ICD-10-CM | POA: Diagnosis not present

## 2019-12-22 DIAGNOSIS — R1011 Right upper quadrant pain: Secondary | ICD-10-CM

## 2019-12-22 LAB — COMPREHENSIVE METABOLIC PANEL
ALT: 45 U/L (ref 0–53)
AST: 30 U/L (ref 0–37)
Albumin: 4.6 g/dL (ref 3.5–5.2)
Alkaline Phosphatase: 72 U/L (ref 39–117)
BUN: 17 mg/dL (ref 6–23)
CO2: 31 mEq/L (ref 19–32)
Calcium: 10 mg/dL (ref 8.4–10.5)
Chloride: 105 mEq/L (ref 96–112)
Creatinine, Ser: 1.07 mg/dL (ref 0.40–1.50)
GFR: 70.49 mL/min (ref >60.00)
Glucose, Bld: 75 mg/dL (ref 70–99)
Potassium: 4.8 mEq/L (ref 3.5–5.1)
Sodium: 141 mEq/L (ref 135–145)
Total Bilirubin: 0.4 mg/dL (ref 0.2–1.2)
Total Protein: 7.2 g/dL (ref 6.0–8.3)

## 2019-12-22 LAB — CBC WITH DIFFERENTIAL/PLATELET
Basophils Absolute: 0 10*3/uL (ref 0.0–0.1)
Basophils Relative: 0.5 % (ref 0.0–3.0)
Eosinophils Absolute: 0.1 10*3/uL (ref 0.0–0.7)
Eosinophils Relative: 1.2 % (ref 0.0–5.0)
HCT: 50.4 % (ref 39.0–52.0)
Hemoglobin: 17.2 g/dL — ABNORMAL HIGH (ref 13.0–17.0)
Lymphocytes Relative: 23 % (ref 12.0–46.0)
Lymphs Abs: 1.7 10*3/uL (ref 0.7–4.0)
MCHC: 34.1 g/dL (ref 30.0–36.0)
MCV: 93.5 fl (ref 78.0–100.0)
Monocytes Absolute: 0.6 10*3/uL (ref 0.1–1.0)
Monocytes Relative: 8.8 % (ref 3.0–12.0)
Neutro Abs: 4.9 10*3/uL (ref 1.4–7.7)
Neutrophils Relative %: 66.5 % (ref 43.0–77.0)
Platelets: 244 10*3/uL (ref 150.0–400.0)
RBC: 5.39 Mil/uL (ref 4.22–5.81)
RDW: 13.4 % (ref 11.5–15.5)
WBC: 7.3 10*3/uL (ref 4.0–10.5)

## 2019-12-22 LAB — LIPASE: Lipase: 25 U/L (ref 11.0–59.0)

## 2019-12-22 MED ORDER — TRAMADOL HCL 50 MG PO TABS: 50.0000 mg | ORAL_TABLET | Freq: Four times a day (QID) | ORAL | 0 refills | Status: AC | PRN

## 2019-12-22 NOTE — Progress Notes (Signed)
Subjective:    Patient ID: Matthew Nixon, male    DOB: [birth year], 59 y.o.   MRN: [id number]  HPI  Pt in discuss rt side abdomen pain. October visit 04/04/2019.  Pt in the past rt side pain.  Pt had evaluation in the past. In past had US abdomen, ct renal stone study as well.  I saw pt back in October. Pain was minimal back then. Now pain is worse just recently. Pt was referred to Dr. Henrene Pastor. He had endoscopy in past for abdomen pain.   [place] in past IMPRESSION: 1. Normal gallbladder without visualization of common bile duct due to moderate hepatic steatosis and beam attenuation. 2. No signs of hydronephrosis with probable right upper pole renal calculus not showing change since study of 01/28/2018.  2018 upper endoscopy. Normal esophagus. - Normal stomach. - Normal examined duodenum. - No specimens collected.   01-2018  CT study shows below.  IMPRESSION: No nephrolithiasis or hydroureteronephrosis is identified bilaterally.  No acute abnormality identified in the abdomen and pelvis.  Lipase studies  8 months ago was normal.  Pt states just past 2 weeks he is getting 8/10 pain. With this pain feels dizzy.  Pain is not associated with eating. With high level pain some nauseau as well. No near syncope or syncope.   When pt twists his thorax has some pain in area. Also pain on using abdomen muscles.  No reporting back pain pain or urinary symptoms.      Review of Systems  Constitutional: Negative for chills, fatigue and fever.  Respiratory: Negative for cough, chest tightness, shortness of breath and wheezing.   Cardiovascular: Negative for chest pain and palpitations.  Gastrointestinal: Positive for abdominal pain. Negative for abdominal distention, blood in stool, constipation, diarrhea, nausea and rectal pain.  Genitourinary: Negative for dysuria, frequency and urgency.  Musculoskeletal: Negative for back pain.  Neurological: Negative for syncope, weakness and  numbness.       See hpi.   Hematological: Negative for adenopathy. Does not bruise/bleed easily.  Psychiatric/Behavioral: Negative for behavioral problems and confusion.    Past Medical History:  Diagnosis Date  . Allergy   . Cardiomyopathy ([place])    DCM with EF 32% on echo 2014 and no ischemia on nuclear stress test.  EF improved to 40-45% by echo 02/2017  . Chronic back pain    low back  . Chronic systolic CHF (congestive heart failure), NYHA class 2 ([place]) 08/07/2016  . COPD (chronic obstructive pulmonary disease) ([place])   . Dyspnea    with exertion  . EKG, abnormal    history of left bundle block since 2002 per pt  . GERD (gastroesophageal reflux disease)   . Headache   . Hypertension   . LBBB (left bundle branch block)   . OSA on CPAP 08/07/2016  . Oxygen deficiency   . Pain    right side pain for many years  . PTSD (post-traumatic stress disorder)   . Sleep apnea      Social History   Socioeconomic History  . Marital status: Married    Spouse name: Not on file  . Number of children: Not on file  . Years of education: Not on file  . Highest education level: Not on file  Occupational History  . Not on file  Tobacco Use  . Smoking status: Never Smoker  . Smokeless tobacco: Never Used  Vaping Use  . Vaping Use: Never used  Substance and Sexual Activity  . Alcohol use:  No  . Drug use: No  . Sexual activity: Not on file  Other Topics Concern  . Not on file  Social History Narrative   2 years college    Social Determinants of Health   Financial Resource Strain:   . Difficulty of Paying Living Expenses:   Food Insecurity:   . Worried About Charity fundraiser in the Last Year:   . Arboriculturist in the Last Year:   Transportation Needs:   . Film/video editor (Medical):   [name] Lack of Transportation (Non-Medical):   Physical Activity:   . Days of Exercise per Week:   . Minutes of Exercise per Session:   Stress:   . Feeling of Stress :   Social  Connections:   . Frequency of Communication with Friends and Family:   . Frequency of Social Gatherings with Friends and Family:   . Attends Religious Services:   . Active Member of Clubs or Organizations:   . Attends Archivist Meetings:   [name] Marital Status:   Intimate Partner Violence:   . Fear of Current or Ex-Partner:   . Emotionally Abused:   [name] Physically Abused:   . Sexually Abused:     Past Surgical History:  Procedure Laterality Date  . ANKLE SURGERY Right   . CARDIAC CATHETERIZATION  yrs ago  . ESOPHAGOGASTRODUODENOSCOPY (EGD) WITH PROPOFOL N/A 01/13/2017   Procedure: ESOPHAGOGASTRODUODENOSCOPY (EGD) WITH PROPOFOL;  Surgeon: Irene Shipper, MD;  Location: [facility];  Service: Endoscopy;  Laterality: N/A;  . HAND SURGERY     Right  . HAND SURGERY     Left carpel tunnel  . THUMB ARTHROSCOPY Left     Family History  Problem Relation Age of Onset  . Heart disease Mother   . Heart disease Father   . Colon cancer Neg Hx   . Esophageal cancer Neg Hx   . Pancreatic cancer Neg Hx   . Stomach cancer Neg Hx     No Known Allergies  Current Outpatient Medications on File Prior to Visit  Medication Sig Dispense Refill  . albuterol (PROVENTIL HFA;VENTOLIN HFA) 108 (90 Base) MCG/ACT inhaler Inhale 2 puffs into the lungs every 6 (six) hours as needed for wheezing or shortness of breath. 1 Inhaler 0  . amLODipine (NORVASC) 10 MG tablet Take 1 tablet (10 mg total) by mouth daily. 90 tablet 3  . aspirin 81 MG tablet Take 81 mg by mouth daily.    . budesonide-formoterol (SYMBICORT) 80-4.5 MCG/ACT inhaler Inhale 2 puffs into the lungs 2 (two) times daily. 1 Inhaler 3  . carvedilol (COREG) 25 MG tablet Take 25 mg by mouth 2 (two) times daily with a meal.    . cephALEXin (KEFLEX) 500 MG capsule Take 1 capsule (500 mg total) by mouth 2 (two) times daily. 20 capsule 0  . cetirizine (ZYRTEC) 10 MG tablet Take 10 mg by mouth daily.    . diclofenac sodium (VOLTAREN) 1 % GEL  Apply 2 g topically 4 (four) times daily. 3 Tube 2  . digoxin (LANOXIN) 0.125 MG tablet Take 0.125 mg by mouth daily.     [name] doxazosin (CARDURA) 8 MG tablet Take 1 tablet (8 mg total) by mouth daily. 90 tablet 3  . fluticasone (FLONASE) 50 MCG/ACT nasal spray Place 2 sprays into both nostrils daily. 16 g 1  . furosemide (LASIX) 20 MG tablet Take 1 tablet (20 mg total) by mouth daily. 14 tablet 0  . hydrALAZINE (APRESOLINE)  100 MG tablet Take 1 tablet (100 mg total) by mouth 3 (three) times daily. 270 tablet 3  . hydrOXYzine (ATARAX/VISTARIL) 10 MG tablet 1-2 tab po q 8 hours as needed itching. 30 tablet 0  . ipratropium (ATROVENT HFA) 17 MCG/ACT inhaler Inhale 2 puffs into the lungs every 6 (six) hours as needed for wheezing. 1 Inhaler 3  . ISOSORBIDE MONONITRATE PO Take 60 mg by mouth 2 (two) times daily.    . magic mouthwash SOLN 5 ml po qid swish and spit. 200 mL 0  . meclizine (ANTIVERT) 12.5 MG tablet Take 1 tablet (12.5 mg total) by mouth 3 (three) times daily as needed for dizziness. 30 tablet 0  . mometasone-formoterol (DULERA) 200-5 MCG/ACT AERO Inhale 2 puffs into the lungs 2 (two) times daily. 13 g 1  . montelukast (SINGULAIR) 10 MG tablet Take 1 tablet (10 mg total) by mouth at bedtime. 30 tablet 3  . omeprazole (PRILOSEC) 40 MG capsule Take 1 capsule (40 mg total) by mouth daily. 30 capsule 3  . ondansetron (ZOFRAN ODT) 8 MG disintegrating tablet Take 1 tablet (8 mg total) by mouth every 8 (eight) hours as needed for nausea or vomiting. 20 tablet 0  . ranitidine (ZANTAC) 150 MG capsule Take 1 capsule (150 mg total) by mouth 2 (two) times daily. 60 capsule 6  . sacubitril-valsartan (ENTRESTO) 24-26 MG Take 1 tablet by mouth 2 (two) times daily. 60 tablet 2  . spironolactone (ALDACTONE) 25 MG tablet Take 1 tablet (25 mg total) by mouth daily. 90 tablet 3  . tobramycin (TOBREX) 0.3 % ophthalmic solution Place 2 drops into the left eye every 6 (six) hours. 5 mL 0  . traMADol (ULTRAM) 50 MG  tablet 1-2 every 4 hours as needed for cough or pain 40 tablet 0   No current facility-administered medications on file prior to visit.    BP (!) 177/88 (BP Location: Left Arm, Patient Position: Sitting, Cuff Size: Large) Comment: 168/80  Pulse 86   Resp 18   Ht 5\' 9"  (1.753 m)   Wt 197 lb (89.4 kg)   SpO2 96%   BMI 29.09 kg/m       Objective:   Physical Exam  General Mental Status- Alert. General Appearance- Not in acute distress.   Skin General: Color- Normal Color. Moisture- Normal Moisture.  Neck Carotid Arteries- Normal color. Moisture- Normal Moisture. No carotid bruits. No JVD.  Chest and Lung Exam Auscultation: Breath Sounds:-Normal.  Cardiovascular Auscultation:Rythm- Regular. Murmurs & Other Heart Sounds:Auscultation of the heart reveals- No Murmurs.  Abdomen Inspection:-Inspeection Normal. Palpation/Percussion:Note:No mass. Palpation and Percussion of the abdomen reveal- Area rtuq and below  Tender, Non Distended + BS, no rebound or guarding. There is a point tender area just under rt upper quadrant. That fells like defect in fascia between rectus abdominal muscles. But no palpable herniation.    Neurologic Cranial Nerve exam:- CN III-XII intact(No nystagmus), symmetric smile. Strength:- 5/5 equal and symmetric strength both upper and lower extremities.      Assessment & Plan:  Refer to dr. Henrene Pastor, get [place]. Might refer to sport med vs general   For rt side abdomen pain, we got cbc, cmp and lipase.  You have known fatty liver in past. Want to repeat US to assess if same or more present Might ask [place] tech to cover tender area/possible defect in fascia or muscle.  Will plan to refer you to Dr Henrene Pastor again as your pain is worse know. Maybe further work  up of gallbladder function though no classic gallbladder symptoms. He can review cologuard study as well.  Might consider referral to other specialist as well General surgeon or sport medicine to evaluate  the point tender area.   Will give tramadol to help with intermittent severe pain. Nausea and dizziness can sometimes precede vasovagal syncope with high level pain.  If pain high and not resolving then recommend ED evaluation.  Follow up in 10 days or as needed     Mackie Pai, PA-C   Time spent with patient today was 30  minutes which consisted of chart review, discussing diagnosis, work up treatment and documentation.

## 2019-12-22 NOTE — Patient Instructions (Addendum)
For rt side abdomen pain, we got cbc, cmp and lipase.  You have known fatty liver in past. Want to repeat US to assess if same or more present Might ask [place] tech to cover tender area/possible defect in fascia or muscle.  Will plan to refer you to Dr Henrene Pastor again as your pain is worse know. Maybe further work up of gallbladder function though no classic gallbladder symptoms. He can review cologuard study as well.  Might consider referral to other specialist as well General surgeon or sport medicine to evaluate the point tender area.   Will give tramadol to help with intermittent severe pain. Nausea and dizziness can sometimes precede vasovagal syncope with high level pain.  If pain high and not resolving then recommend ED evaluation.  Follow up in 10 days or as needed

## 2019-12-23 ENCOUNTER — Ambulatory Visit (HOSPITAL_BASED_OUTPATIENT_CLINIC_OR_DEPARTMENT_OTHER)
Admission: RE | Admit: 2019-12-23 | Discharge: 2019-12-23 | Disposition: A | Discharge status: Home / Self Care | Payer: Medicare Other | Source: Ambulatory Visit | Attending: Medical | Admitting: Medical

## 2019-12-23 DIAGNOSIS — Q600 Renal agenesis, unilateral: Secondary | ICD-10-CM | POA: Diagnosis not present

## 2019-12-23 DIAGNOSIS — R1011 Right upper quadrant pain: Secondary | ICD-10-CM | POA: Insufficient documentation

## 2020-01-04 ENCOUNTER — Ambulatory Visit: Payer: Medicare Other | Admitting: Medical

## 2020-01-04 DIAGNOSIS — Z0289 Encounter for other administrative examinations: Secondary | ICD-10-CM

## 2020-01-09 DIAGNOSIS — Z23 Encounter for immunization: Secondary | ICD-10-CM | POA: Diagnosis not present

## 2020-10-10 ENCOUNTER — Other Ambulatory Visit: Payer: Self-pay

## 2020-10-10 ENCOUNTER — Telehealth: Payer: Self-pay | Admitting: Medical

## 2020-10-10 ENCOUNTER — Ambulatory Visit (INDEPENDENT_AMBULATORY_CARE_PROVIDER_SITE_OTHER): Payer: Medicare Other | Admitting: Medical

## 2020-10-10 ENCOUNTER — Encounter: Payer: Self-pay | Admitting: Medical

## 2020-10-10 VITALS — BP 148/80 | HR 83 | Resp 20 | Ht 69.0 in | Wt 197.8 lb

## 2020-10-10 DIAGNOSIS — R002 Palpitations: Secondary | ICD-10-CM

## 2020-10-10 DIAGNOSIS — R39198 Other difficulties with micturition: Secondary | ICD-10-CM

## 2020-10-10 DIAGNOSIS — I5022 Chronic systolic (congestive) heart failure: Secondary | ICD-10-CM | POA: Diagnosis not present

## 2020-10-10 DIAGNOSIS — R350 Frequency of micturition: Secondary | ICD-10-CM | POA: Diagnosis not present

## 2020-10-10 LAB — POC URINALSYSI DIPSTICK (AUTOMATED)
Bilirubin, UA: NEGATIVE
Glucose, UA: NEGATIVE
Leukocytes, UA: NEGATIVE
Nitrite, UA: NEGATIVE
Protein, UA: POSITIVE — AB
Spec Grav, UA: >=1.03 — AB (ref 1.010–1.025)
Urobilinogen, UA: 0.2 E.U./dL
pH, UA: 6 (ref 5.0–8.0)

## 2020-10-10 LAB — COMPREHENSIVE METABOLIC PANEL
ALT: 55 U/L — ABNORMAL HIGH (ref 0–53)
AST: 29 U/L (ref 0–37)
Albumin: 4.1 g/dL (ref 3.5–5.2)
Alkaline Phosphatase: 69 U/L (ref 39–117)
BUN: 15 mg/dL (ref 6–23)
CO2: 31 mEq/L (ref 19–32)
Calcium: 9.7 mg/dL (ref 8.4–10.5)
Chloride: 103 mEq/L (ref 96–112)
Creatinine, Ser: 1.09 mg/dL (ref 0.40–1.50)
GFR: 73.6 mL/min (ref >60.00)
Glucose, Bld: 119 mg/dL — ABNORMAL HIGH (ref 70–99)
Potassium: 4.4 mEq/L (ref 3.5–5.1)
Sodium: 141 mEq/L (ref 135–145)
Total Bilirubin: 0.7 mg/dL (ref 0.2–1.2)
Total Protein: 6.8 g/dL (ref 6.0–8.3)

## 2020-10-10 LAB — BRAIN NATRIURETIC PEPTIDE: Pro B Natriuretic peptide (BNP): 93 pg/mL (ref 0.0–100.0)

## 2020-10-10 LAB — PSA: PSA: 1.11 ng/mL (ref 0.10–4.00)

## 2020-10-10 MED ORDER — SULFAMETHOXAZOLE-TRIMETHOPRIM 800-160 MG PO TABS: 1.0000 | ORAL_TABLET | Freq: Two times a day (BID) | ORAL | 0 refills | Status: DC

## 2020-10-10 NOTE — Telephone Encounter (Signed)
Prescription of Bactrim DS sent to patient's pharmacy.

## 2020-10-10 NOTE — Progress Notes (Signed)
Subjective:    Patient ID: Matthew Nixon, male    DOB: 1961/03/10, 60 y.o.   MRN: [id number]  HPI   Pt in for follow up.  Pt states recently he has some recent issues urinating. States some difficulty starting urine flow and some dribbling at end of the flow. Some perineum pain when he sits. He states symptoms since December. But he was in [place] when symptoms started. In past pt had low level psa.  Dad, uncles and sibling no prostate cancer.   Pt states in December he states he had covid. He had covid infection but mostly diarrhea and vomiting. He states no upper respiratory signs/symptoms.  Pt mentions recently sometimes when he lies down will feel palpitation. He states felt this back in December. History of cardiomyopathy. Pt has seen Dr Fransico Him in past. He wants to be referred back. Pt in past reluctant to make changes to current bp med regimen. Pt states when increased hydralazine 100 mg tid he got ha.   Left ventricular ejection fraction, by visual estimation, is 30 to 35%. The left ventricle has moderate to severely decreased function. There is no left ventricular hypertrophy. 2. Abnormal septal motion consistent with left bundle branch block. There is profound septallateral dyssynchrony. 3. Left ventricular diastolic parameters are consistent with Grade I diastolic dysfunction (impaired relaxation). 4. The left ventricle demonstrates global hypokinesis. 5. Global right ventricle has normal systolic function.The right ventricular size is normal. No increase in right ventricular wall thickness. 6. Left atrial size was mildly dilated. 7. Right atrial size was normal. 8. The mitral valve is normal in structure. Trace mitral valve regurgitation. 9. The tricuspid valve is normal in structure. Tricuspid valve regurgitation is not demonstrated. 10. The aortic valve is normal in structure. Aortic valve regurgitation is not visualized. 11. The pulmonic valve was  grossly normal. Pulmonic valve regurgitation is trivial. 12. TR signal is inadequate for assessing pulmonary artery systolic pressure. 13. The inferior vena cava is normal in size with greater than 50% respiratory variability, suggesting right atrial pressure of 3 mmHg. 14. Previous report describes LVEF 40-45%, but side-by-side comparison with today's images shows very similar findings. In comparison to the previous echocardiogram(s): 03/13/17 EF 40-45%.  Pt states entresto is now covered under his insurance. Dr. Merita Norton cardiologist in [place] recommended he start that. In the past his insurance would not cover.    Review of Systems  Constitutional: Negative for chills, fatigue and fever.  Eyes: Negative for photophobia, pain and visual disturbance.  Respiratory: Negative for cough, chest tightness, shortness of breath and wheezing.   Cardiovascular: Positive for palpitations. Negative for chest pain.       No current symptoms. See hpi.  Gastrointestinal: Negative for abdominal pain.  Genitourinary: Positive for frequency. Negative for decreased urine volume, dysuria and testicular pain.       At times hesitant urine flow and diffuclty urination. Dribbling flow.  Musculoskeletal: Negative for back pain, myalgias and neck stiffness.  Skin: Negative for rash.  Neurological: Negative for dizziness, speech difficulty, weakness, numbness and headaches.  Hematological: Negative for adenopathy. Does not bruise/bleed easily.  Psychiatric/Behavioral: Negative for behavioral problems, decreased concentration and suicidal ideas. The patient is not nervous/anxious.    Past Medical History:  Diagnosis Date  . Allergy   . Cardiomyopathy ([place])    DCM with EF 32% on echo 2014 and no ischemia on nuclear stress test.  EF improved to 40-45% by echo 02/2017  . Chronic back pain  low back  . Chronic systolic CHF (congestive heart failure), NYHA class 2 ([place]) 08/07/2016  . COPD (chronic obstructive  pulmonary disease) ([place])   . Dyspnea    with exertion  . EKG, abnormal    history of left bundle block since 2002 per pt  . GERD (gastroesophageal reflux disease)   . Headache   . Hypertension   . LBBB (left bundle branch block)   . OSA on CPAP 08/07/2016  . Oxygen deficiency   . Pain    right side pain for many years  . PTSD (post-traumatic stress disorder)   . Sleep apnea      Social History   Socioeconomic History  . Marital status: Married    Spouse name: Not on file  . Number of children: Not on file  . Years of education: Not on file  . Highest education level: Not on file  Occupational History  . Not on file  Tobacco Use  . Smoking status: Never Smoker  . Smokeless tobacco: Never Used  Vaping Use  . Vaping Use: Never used  Substance and Sexual Activity  . Alcohol use: No  . Drug use: No  . Sexual activity: Not on file  Other Topics Concern  . Not on file  Social History Narrative   2 years college    Social Determinants of Health   Financial Resource Strain: Not on file  Food Insecurity: Not on file  Transportation Needs: Not on file  Physical Activity: Not on file  Stress: Not on file  Social Connections: Not on file  Intimate Partner Violence: Not on file    Past Surgical History:  Procedure Laterality Date  . ANKLE SURGERY Right   . CARDIAC CATHETERIZATION  yrs ago  . ESOPHAGOGASTRODUODENOSCOPY (EGD) WITH PROPOFOL N/A 01/13/2017   Procedure: ESOPHAGOGASTRODUODENOSCOPY (EGD) WITH PROPOFOL;  Surgeon: Irene Shipper, MD;  Location: [facility];  Service: Endoscopy;  Laterality: N/A;  . HAND SURGERY     Right  . HAND SURGERY     Left carpel tunnel  . THUMB ARTHROSCOPY Left     Family History  Problem Relation Age of Onset  . Heart disease Mother   . Heart disease Father   . Colon cancer Neg Hx   . Esophageal cancer Neg Hx   . Pancreatic cancer Neg Hx   . Stomach cancer Neg Hx     No Known Allergies  Current Outpatient Medications on  File Prior to Visit  Medication Sig Dispense Refill  . albuterol (PROVENTIL HFA;VENTOLIN HFA) 108 (90 Base) MCG/ACT inhaler Inhale 2 puffs into the lungs every 6 (six) hours as needed for wheezing or shortness of breath. 1 Inhaler 0  . amLODipine (NORVASC) 10 MG tablet Take 1 tablet (10 mg total) by mouth daily. 90 tablet 3  . aspirin 81 MG tablet Take 81 mg by mouth daily.    . budesonide-formoterol (SYMBICORT) 80-4.5 MCG/ACT inhaler Inhale 2 puffs into the lungs 2 (two) times daily. 1 Inhaler 3  . carvedilol (COREG) 25 MG tablet Take 25 mg by mouth 2 (two) times daily with a meal.    . cetirizine (ZYRTEC) 10 MG tablet Take 10 mg by mouth daily.    . diclofenac sodium (VOLTAREN) 1 % GEL Apply 2 g topically 4 (four) times daily. 3 Tube 2  . digoxin (LANOXIN) 0.125 MG tablet Take 0.125 mg by mouth daily.     [name] doxazosin (CARDURA) 8 MG tablet Take 1 tablet (8 mg total) by mouth  daily. 90 tablet 3  . fluticasone (FLONASE) 50 MCG/ACT nasal spray Place 2 sprays into both nostrils daily. 16 g 1  . furosemide (LASIX) 20 MG tablet Take 1 tablet (20 mg total) by mouth daily. 14 tablet 0  . hydrALAZINE (APRESOLINE) 100 MG tablet Take 1 tablet (100 mg total) by mouth 3 (three) times daily. 270 tablet 3  . hydrOXYzine (ATARAX/VISTARIL) 10 MG tablet 1-2 tab po q 8 hours as needed itching. 30 tablet 0  . ipratropium (ATROVENT HFA) 17 MCG/ACT inhaler Inhale 2 puffs into the lungs every 6 (six) hours as needed for wheezing. 1 Inhaler 3  . ISOSORBIDE MONONITRATE PO Take 60 mg by mouth 2 (two) times daily.    . magic mouthwash SOLN 5 ml po qid swish and spit. 200 mL 0  . meclizine (ANTIVERT) 12.5 MG tablet Take 1 tablet (12.5 mg total) by mouth 3 (three) times daily as needed for dizziness. 30 tablet 0  . mometasone-formoterol (DULERA) 200-5 MCG/ACT AERO Inhale 2 puffs into the lungs 2 (two) times daily. 13 g 1  . montelukast (SINGULAIR) 10 MG tablet Take 1 tablet (10 mg total) by mouth at bedtime. 30 tablet 3  .  omeprazole (PRILOSEC) 40 MG capsule Take 1 capsule (40 mg total) by mouth daily. 30 capsule 3  . ondansetron (ZOFRAN ODT) 8 MG disintegrating tablet Take 1 tablet (8 mg total) by mouth every 8 (eight) hours as needed for nausea or vomiting. 20 tablet 0  . ranitidine (ZANTAC) 150 MG capsule Take 1 capsule (150 mg total) by mouth 2 (two) times daily. 60 capsule 6  . sacubitril-valsartan (ENTRESTO) 24-26 MG Take 1 tablet by mouth 2 (two) times daily. 60 tablet 2  . spironolactone (ALDACTONE) 25 MG tablet Take 1 tablet (25 mg total) by mouth daily. 90 tablet 3  . tobramycin (TOBREX) 0.3 % ophthalmic solution Place 2 drops into the left eye every 6 (six) hours. 5 mL 0  . traMADol (ULTRAM) 50 MG tablet 1-2 every 4 hours as needed for cough or pain 40 tablet 0   No current facility-administered medications on file prior to visit.    BP (!) 148/80   Pulse 83   Resp 20   Ht 5\' 9"  (1.753 m)   Wt 197 lb 12.8 oz (89.7 kg)   SpO2 98%   BMI 29.21 kg/m        Objective:   Physical Exam  General Mental Status- Alert. General Appearance- Not in acute distress.   Skin General: Color- Normal Color. Moisture- Normal Moisture.  Neck Carotid Arteries- Normal color. Moisture- Normal Moisture. No carotid bruits. No JVD.  Chest and Lung Exam Auscultation: Breath Sounds:-Normal.  Cardiovascular Auscultation:Rythm- Regular. Murmurs & Other Heart Sounds:Auscultation of the heart reveals- No Murmurs.  Abdomen Inspection:-Inspeection Normal. Palpation/Percussion:Note:No mass. Palpation and Percussion of the abdomen reveal- Non Tender, Non Distended + BS, no rebound or guarding.    Neurologic Cranial Nerve exam:- CN III-XII intact(No nystagmus), symmetric smile. Strength:- 5/5 equal and symmetric strength both upper and lower extremities.  Rectal- portion of prostate felt smooth and normal size.  Lower ext- no pedal edema. Bilaterally.     Assessment & Plan:  History of CHF and  hypertension.  Blood pressure is a little bit high today.  Continue current medication regimen.  We will go ahead and refer you back to cardiologist Dr. Radford Pax.  I do think starting on Entresto would be a good idea as this can help preserve ejection fraction.  Med list indicates that you are on spironolactone.  If you do use Entresto in the future elevated potassium could occur.  Want you to get opinion from cardiologist if diuretic needs to be changed.  I will get CMP and BNP today.  Recent palpitation back in the winter shortly after he had COVID.  EKG today done.   Some described hesitant urine flow with some frequency at times.  Some perineal pain as well.  We will get UA, urine culture and PSA.  After review of those labs decide on possibly starting antibiotics.  Also might prescribe Flomax.  Follow-up in 2 weeks or as needed.   Time spent with patient today was 42 minutes which consisted of chart revdiew, discussing diagnosis, work up treatment and documentation.

## 2020-10-10 NOTE — Patient Instructions (Addendum)
History of CHF and hypertension.  Blood pressure is a little bit high today.  Continue current medication regimen.  We will go ahead and refer you back to cardiologist Dr. Radford Pax.  I do think starting on Entresto would be a good idea as this can help preserve ejection fraction.  Med list indicates that you are on spironolactone.  If you do use Entresto in the future elevated potassium could occur.  Want you to get opinion from cardiologist if diuretic needs to be changed.  I will get CMP and BNP today.  Recent palpitation back in the winter shortly after he had COVID.  EKG today done.(sinus rhythm. Left bundle branch block) No change from prior done in 2019.   Some described hesitant urine flow with some frequency at times.  Some perineal pain as well.  We will get UA, urine culture and PSA.  After review of those labs decide on possibly starting antibiotics.  Also might prescribe Flomax.  Follow-up in 2 weeks or as needed.

## 2020-10-11 LAB — URINE CULTURE
MICRO NUMBER:: 11792259
Result:: NO GROWTH
SPECIMEN QUALITY:: ADEQUATE

## 2020-10-16 ENCOUNTER — Telehealth: Payer: Self-pay | Admitting: Medical

## 2020-10-16 NOTE — Telephone Encounter (Signed)
Pt came in office wanting to have lab results. Pt had to update his tel number since it has changed. Please advise pt.

## 2020-10-16 NOTE — Telephone Encounter (Signed)
Called to go over rsults and schedule follow up ,stated he would have to call our office back

## 2020-11-23 ENCOUNTER — Encounter: Payer: Self-pay | Admitting: Cardiology

## 2020-11-23 ENCOUNTER — Ambulatory Visit (INDEPENDENT_AMBULATORY_CARE_PROVIDER_SITE_OTHER): Payer: Medicare Other | Admitting: Cardiology

## 2020-11-23 ENCOUNTER — Other Ambulatory Visit: Payer: Self-pay

## 2020-11-23 ENCOUNTER — Encounter (INDEPENDENT_AMBULATORY_CARE_PROVIDER_SITE_OTHER): Payer: Self-pay

## 2020-11-23 VITALS — BP 140/90 | HR 63 | Ht 69.0 in | Wt 197.8 lb

## 2020-11-23 DIAGNOSIS — G4733 Obstructive sleep apnea (adult) (pediatric): Secondary | ICD-10-CM

## 2020-11-23 DIAGNOSIS — I5022 Chronic systolic (congestive) heart failure: Secondary | ICD-10-CM | POA: Diagnosis not present

## 2020-11-23 DIAGNOSIS — R072 Precordial pain: Secondary | ICD-10-CM | POA: Diagnosis not present

## 2020-11-23 DIAGNOSIS — I420 Dilated cardiomyopathy: Secondary | ICD-10-CM

## 2020-11-23 DIAGNOSIS — I10 Essential (primary) hypertension: Secondary | ICD-10-CM | POA: Diagnosis not present

## 2020-11-23 MED ORDER — FUROSEMIDE 20 MG PO TABS: 20.0000 mg | ORAL_TABLET | Freq: Every day | ORAL | 3 refills | Status: DC

## 2020-11-23 MED ORDER — DOXAZOSIN MESYLATE 8 MG PO TABS: 8.0000 mg | ORAL_TABLET | Freq: Every day | ORAL | 3 refills | Status: DC

## 2020-11-23 MED ORDER — CARVEDILOL 25 MG PO TABS: 25.0000 mg | ORAL_TABLET | Freq: Two times a day (BID) | ORAL | 3 refills | Status: DC

## 2020-11-23 MED ORDER — DAPAGLIFLOZIN PROPANEDIOL 10 MG PO TABS: 10.0000 mg | ORAL_TABLET | Freq: Every day | ORAL | 3 refills | Status: DC

## 2020-11-23 MED ORDER — HYDRALAZINE HCL 100 MG PO TABS
100.0000 mg | ORAL_TABLET | Freq: Three times a day (TID) | ORAL | 3 refills | Status: DC
Start: 2020-11-23 — End: 2022-08-15

## 2020-11-23 MED ORDER — SPIRONOLACTONE 25 MG PO TABS: 25.0000 mg | ORAL_TABLET | Freq: Every day | ORAL | 3 refills | Status: DC

## 2020-11-23 MED ORDER — DIGOXIN 125 MCG PO TABS: 0.1250 mg | ORAL_TABLET | Freq: Every day | ORAL | 3 refills | Status: DC

## 2020-11-23 MED ORDER — AMLODIPINE BESYLATE 10 MG PO TABS: 10.0000 mg | ORAL_TABLET | Freq: Every day | ORAL | 3 refills | Status: DC

## 2020-11-23 MED ORDER — METOPROLOL TARTRATE 100 MG PO TABS: 100.0000 mg | ORAL_TABLET | Freq: Once | ORAL | 0 refills | Status: DC

## 2020-11-23 MED ORDER — ENTRESTO 49-51 MG PO TABS: 1.0000 | ORAL_TABLET | Freq: Two times a day (BID) | ORAL | 1 refills | Status: DC

## 2020-11-23 NOTE — Progress Notes (Signed)
Date:  11/23/2020   ID:  Matthew Nixon, DOB Mar 09, 1961, MRN [id number]   PCP:  Mackie Pai, PA-C  Cardiologist:  Fransico Him, MD Electrophysiologist:  None   Chief Complaint:  DCM, CHF, HTN, OSA  History of Present Illness:    Matthew Nixon is a 60 y.o. male with a hx of DCM with EF 32% (echo 2014) and repeat echo 02/2017 with improved LVF with EF 40-45% and no ischemia on nuclear stress, chronic systolic heart failure NYHA class II, COPD, hypertension, left bundle branch block and obstructive sleep apnea. His last PSG showed mild obstructive sleep apnea with an overall AHI of 7.6/h with severe sleep apnea during REM sleep with an AHI 27.7/h. Oxygen desaturations dropped as low as 86%. He was placed on  CPAP at 9cm H2O.   He is here today for followup.  He tells me that he had COVID 41 back in December and ever since then he notices palpitations mainly when he is in bed at night.  It usually occurs 3 times weekly.  He also has been feeling very fatigued.  He has chronic DOE which he thinks may be a little worse than prior.  He is waking up some at night gasping for breath and has to sit up and take a deep breath.  He is not using his CPAP as he was intolerant.  He denies any  LE edema or syncope. He is compliant with his meds and is tolerating meds with no SE.    Prior CV studies:   The following studies were reviewed today:  EKG  Past Medical History:  Diagnosis Date  . Allergy   . Cardiomyopathy ([place])    DCM with EF 32% on echo 2014 and no ischemia on nuclear stress test.  EF improved to 40-45% by echo 02/2017  . Chronic back pain    low back  . Chronic systolic CHF (congestive heart failure), NYHA class 2 ([place]) 08/07/2016  . COPD (chronic obstructive pulmonary disease) ([place])   . Dyspnea    with exertion  . EKG, abnormal    history of left bundle block since 2002 per pt  . GERD (gastroesophageal reflux disease)   . Headache   . Hypertension   . LBBB (left bundle  branch block)   . OSA on CPAP 08/07/2016  . Oxygen deficiency   . Pain    right side pain for many years  . PTSD (post-traumatic stress disorder)   . Sleep apnea    Past Surgical History:  Procedure Laterality Date  . ANKLE SURGERY Right   . CARDIAC CATHETERIZATION  yrs ago  . ESOPHAGOGASTRODUODENOSCOPY (EGD) WITH PROPOFOL N/A 01/13/2017   Procedure: ESOPHAGOGASTRODUODENOSCOPY (EGD) WITH PROPOFOL;  Surgeon: Irene Shipper, MD;  Location: [facility];  Service: Endoscopy;  Laterality: N/A;  . HAND SURGERY     Right  . HAND SURGERY     Left carpel tunnel  . THUMB ARTHROSCOPY Left      Current Meds  Medication Sig  . albuterol (PROVENTIL HFA;VENTOLIN HFA) 108 (90 Base) MCG/ACT inhaler Inhale 2 puffs into the lungs every 6 (six) hours as needed for wheezing or shortness of breath.  [name] amLODipine (NORVASC) 10 MG tablet Take 1 tablet (10 mg total) by mouth daily.  [name] aspirin 81 MG tablet Take 81 mg by mouth daily.  . budesonide-formoterol (SYMBICORT) 80-4.5 MCG/ACT inhaler Inhale 2 puffs into the lungs 2 (two) times daily.  . carvedilol (COREG) 25 MG tablet Take 25 mg  by mouth 2 (two) times daily with a meal.  . cetirizine (ZYRTEC) 10 MG tablet Take 10 mg by mouth daily.  . diclofenac sodium (VOLTAREN) 1 % GEL Apply 2 g topically 4 (four) times daily.  . digoxin (LANOXIN) 0.125 MG tablet Take 0.125 mg by mouth daily.   [name] doxazosin (CARDURA) 8 MG tablet Take 1 tablet (8 mg total) by mouth daily.  . fluticasone (FLONASE) 50 MCG/ACT nasal spray Place 2 sprays into both nostrils daily.  . furosemide (LASIX) 20 MG tablet Take 1 tablet (20 mg total) by mouth daily.  . hydrALAZINE (APRESOLINE) 100 MG tablet Take 1 tablet (100 mg total) by mouth 3 (three) times daily.  . hydrochlorothiazide (HYDRODIURIL) 25 MG tablet Take 1 tablet by mouth daily.  . hydrOXYzine (ATARAX/VISTARIL) 10 MG tablet 1-2 tab po q 8 hours as needed itching.  [name] ipratropium (ATROVENT HFA) 17 MCG/ACT inhaler Inhale 2 puffs  into the lungs every 6 (six) hours as needed for wheezing.  . ISOSORBIDE MONONITRATE PO Take 60 mg by mouth 2 (two) times daily.  . meclizine (ANTIVERT) 12.5 MG tablet Take 1 tablet (12.5 mg total) by mouth 3 (three) times daily as needed for dizziness.  . mometasone-formoterol (DULERA) 200-5 MCG/ACT AERO Inhale 2 puffs into the lungs 2 (two) times daily.  . montelukast (SINGULAIR) 10 MG tablet Take 1 tablet (10 mg total) by mouth at bedtime.  [name] omeprazole (PRILOSEC) 40 MG capsule Take 1 capsule (40 mg total) by mouth daily.  . ondansetron (ZOFRAN ODT) 8 MG disintegrating tablet Take 1 tablet (8 mg total) by mouth every 8 (eight) hours as needed for nausea or vomiting.  . ranitidine (ZANTAC) 150 MG capsule Take 1 capsule (150 mg total) by mouth 2 (two) times daily.  . sacubitril-valsartan (ENTRESTO) 24-26 MG Take 1 tablet by mouth 2 (two) times daily.  [name] spironolactone (ALDACTONE) 25 MG tablet Take 1 tablet (25 mg total) by mouth daily.  [name] tobramycin (TOBREX) 0.3 % ophthalmic solution Place 2 drops into the left eye every 6 (six) hours.  . traMADol (ULTRAM) 50 MG tablet 1-2 every 4 hours as needed for cough or pain     Allergies:   Patient has no known allergies.   Social History   Tobacco Use  . Smoking status: Never Smoker  . Smokeless tobacco: Never Used  Vaping Use  . Vaping Use: Never used  Substance Use Topics  . Alcohol use: No  . Drug use: No     Family Hx: The patient's family history includes Heart disease in his father and mother. There is no history of Colon cancer, Esophageal cancer, Pancreatic cancer, or Stomach cancer.  ROS:   Please see the history of present illness.     All other systems reviewed and are negative.   Labs/Other Tests and Data Reviewed:    Recent Labs: 12/22/2019: Hemoglobin 17.2; Platelets 244.0 10/10/2020: ALT 55; BUN 15; Creatinine, Ser 1.09; Potassium 4.4; Pro B Natriuretic peptide (BNP) 93.0; Sodium 141   Recent Lipid Panel Lab Results   Component Value Date/Time   CHOL 159 03/18/2016 07:08 AM   TRIG 118.0 03/18/2016 07:08 AM   HDL 38.90 (L) 03/18/2016 07:08 AM   CHOLHDL 4 03/18/2016 07:08 AM   LDLCALC 96 03/18/2016 07:08 AM    Wt Readings from Last 3 Encounters:  11/23/20 197 lb 12.8 oz (89.7 kg)  10/10/20 197 lb 12.8 oz (89.7 kg)  12/22/19 197 lb (89.4 kg)     Objective:  Vital Signs:  BP 140/90   Pulse 63   Ht 5\' 9"  (1.753 m)   Wt 197 lb 12.8 oz (89.7 kg)   SpO2 95%   BMI 29.21 kg/m   GEN: Well nourished, well developed in no acute distress HEENT: Normal NECK: No JVD; No carotid bruits LYMPHATICS: No lymphadenopathy CARDIAC:RRR, no murmurs, rubs, gallops RESPIRATORY:  Clear to auscultation without rales, wheezing or rhonchi  ABDOMEN: Soft, non-tender, non-distended MUSCULOSKELETAL:  No edema; No deformity  SKIN: Warm and dry NEUROLOGIC:  Alert and oriented x 3 PSYCHIATRIC:  Normal affect   ASSESSMENT & PLAN:    1.  DCM -prior nuclear stress test with no ischemia -echo 2018 with EF 40-45% and decreased to 30-35% on echo 03/2019 -likely HTN DCM - nuclear stress test in [place] in 2020  with no ischemia -Continue prescription drug management with Carvedilol 25mg  BID, spiro 25mg  daily, Entresto 24-26mg  BID, Hydralazine 100mg  TID, Imdur 60mg  daily and Lasix 20mg  daily -I have personally reviewed and interpreted outside labs performed by patient's PCP which showed SCr 1.09, K+ 4.4, BNP 93 in April 2022 -I am going to repeat a 2D echo since he has had more SOB and fatigue since his COVID 19 to make sure that his LVF has not declined further -I am going to get a coronary CTA to assess for CAD since he thinks that his CP has gotten worse - he has a remote hx of normal cors on cath a long time ago in [place]  2.  Chronic systolic CHF NYHA class IIb -he appears euvolemic on exam today -weight remains stable -he needs aggressive BP control which I suspect his driving his DCM -increase Entresto to 49-51mg  BID for BP  control and for his LV dysfunction -continue Carvedilol 25mg  BID, spiro 25mg  daily, Imdur 60mg  daily, digoxin 0.125mg  daily and Hyralazine 100mg  TID -BMET in 1 week -followup with PHarmD in 2 weeks for uptitration of Entresto -start Farxiga 10mg  daily -check dig level  3.  HTN -BP is improved today but still elevated  -Continue prescription drug management with Carvedilol 25mg  BID, amlodipine 10mg  daily,  doxazosin 8mg  daily, Imdur 60mg  daily and Hydralazine 100mg  TID -stop HCTZ and increase Arlyce Harman to 50mg  daily -check BMET in 1 week  4.  OSA  -intolerant to CPAP -he is waking up gasping for breath -we discussed the Inspire device but he is not interested  Medication Adjustments/Labs and Tests Ordered: Current medicines are reviewed at length with the patient today.  Concerns regarding medicines are outlined above.  Tests Ordered: No orders of the defined types were placed in this encounter.  Medication Changes: No orders of the defined types were placed in this encounter.   Disposition:  Follow up 2 weeks with PharmD for uptitration of HF meds, followup with me in 1 year  Signed, Fransico Him, MD  11/23/2020 9:43 AM    [place]

## 2020-11-23 NOTE — Patient Instructions (Addendum)
Medication Instructions:  Your physician has recommended you make the following change in your medication:  1) INCREASE Entresto to 49-51 mg twice daily  2) STOP taking hydrochlorothiazide 3) START takinf Farxiga 10 mg daily   *If you need a refill on your cardiac medications before your next appointment, please call your pharmacy*   Lab Work: BMET in one week  If you have labs (blood work) drawn today and your tests are completely normal, you will receive your results only by: [name] MyChart Message (if you have MyChart) OR . A paper copy in the mail If you have any lab test that is abnormal or we need to change your treatment, we will call you to review the results.   Testing/Procedures: Your physician has requested that you have an echocardiogram. Echocardiography is a painless test that uses sound waves to create images of your heart. It provides your doctor with information about the size and shape of your heart and how well your heart's chambers and valves are working. This procedure takes approximately one hour. There are no restrictions for this procedure.  Your physician has requested that you have a coronary CTA scan. Please see below for further instructions.   Follow-Up: At [facility], you and your health needs are our priority.  As part of our continuing mission to provide you with exceptional heart care, we have created designated Provider Care Teams.  These Care Teams include your primary Cardiologist (physician) and Advanced Practice Providers (APPs -  Physician Assistants and Nurse Practitioners) who all work together to provide you with the care you need, when you need it.  Your next appointment:   1 year(s)  The format for your next appointment:   In Person  Provider:   You may see Fransico Him, MD or one of the following Advanced Practice Providers on your designated Care Team:    Melina Copa, PA-C  Ermalinda Barrios, PA-C  You have been referred to see the  Pharmacist in the Heart Failure clinic in two weeks  Other Instructions Your cardiac CT will be scheduled at:   [facility] [address] [place], [place] [address] [phone]  Please arrive at the [facility] main entrance (entrance A) of [facility] 30 minutes prior to test start time. Proceed to the [facility] Radiology Department (first floor) to check-in and test prep.  Please follow these instructions carefully (unless otherwise directed):  Hold all erectile dysfunction medications at least 3 days (72 hrs) prior to test.  On the Night Before the Test: . Be sure to Drink plenty of water. . Do not consume any caffeinated/decaffeinated beverages or chocolate 12 hours prior to your test. . Do not take any antihistamines 12 hours prior to your test.  On the Day of the Test: . Drink plenty of water until 1 hour prior to the test. . Do not eat any food 4 hours prior to the test. . You may take your regular medications prior to the test.  . Take metoprolol (Lopressor) two hours prior to test. . HOLD carvedilol and furosemide (Lasix) on the morning of the test.       After the Test: . Drink plenty of water. . After receiving IV contrast, you may experience a mild flushed feeling. This is normal. . On occasion, you may experience a mild rash up to 24 hours after the test. This is not dangerous. If this occurs, you can take Benadryl 25 mg and increase your fluid  intake. . If you experience trouble breathing, this can be serious. If it is severe call 911 IMMEDIATELY. If it is mild, please call our office. . If you take any of these medications: Glipizide/Metformin, Avandament, Glucavance, please do not take 48 hours after completing test unless otherwise instructed.   Once we have confirmed authorization from your insurance company, we will call you to set up a date and time for your test. Based on how quickly your insurance processes prior authorizations  requests, please allow up to 4 weeks to be contacted for scheduling your Cardiac CT appointment. Be advised that routine Cardiac CT appointments could be scheduled as many as 8 weeks after your provider has ordered it.  For non-scheduling related questions, please contact the cardiac imaging nurse navigator should you have any questions/concerns: Marchia Bond, Cardiac Imaging Nurse Navigator Gordy Clement, Cardiac Imaging Nurse Navigator [facility] Direct Office Dial: [phone]   For scheduling needs, including cancellations and rescheduling, please call [place], [phone].

## 2020-11-23 NOTE — Addendum Note (Signed)
Addended by: Antonieta Iba on: 11/23/2020 10:08 AM   Modules accepted: Orders

## 2020-11-30 ENCOUNTER — Other Ambulatory Visit: Payer: Medicare Other | Admitting: *Deleted

## 2020-11-30 ENCOUNTER — Other Ambulatory Visit: Payer: Self-pay

## 2020-11-30 DIAGNOSIS — I420 Dilated cardiomyopathy: Secondary | ICD-10-CM

## 2020-11-30 DIAGNOSIS — I10 Essential (primary) hypertension: Secondary | ICD-10-CM | POA: Diagnosis not present

## 2020-11-30 DIAGNOSIS — I5022 Chronic systolic (congestive) heart failure: Secondary | ICD-10-CM | POA: Diagnosis not present

## 2020-12-04 LAB — BASIC METABOLIC PANEL
BUN/Creatinine Ratio: 17 (ref 10–24)
BUN: 17 mg/dL (ref 8–27)
CO2: 23 mmol/L (ref 20–29)
Calcium: 9.9 mg/dL (ref 8.6–10.2)
Chloride: 103 mmol/L (ref 96–106)
Creatinine, Ser: 1 mg/dL (ref 0.76–1.27)
Glucose: 84 mg/dL (ref 65–99)
Potassium: 4.3 mmol/L (ref 3.5–5.2)
Sodium: 141 mmol/L (ref 134–144)
eGFR: 86 mL/min/{1.73_m2} (ref >59)

## 2020-12-04 LAB — DIGOXIN LEVEL: Digoxin, Serum: <0.4 ng/mL — ABNORMAL LOW (ref 0.5–0.9)

## 2020-12-12 ENCOUNTER — Telehealth (HOSPITAL_COMMUNITY): Payer: Self-pay | Admitting: Emergency Medicine

## 2020-12-12 NOTE — Telephone Encounter (Signed)
Attempted to call patient regarding upcoming cardiac CT appointment. °Left message on voicemail with name and callback number °Matthew Pousson RN Navigator Cardiac Imaging ° [facility] °[phone] Office °[phone] Cell ° °

## 2020-12-13 ENCOUNTER — Ambulatory Visit (INDEPENDENT_AMBULATORY_CARE_PROVIDER_SITE_OTHER): Payer: Medicare Other | Admitting: Pharmacist

## 2020-12-13 ENCOUNTER — Telehealth (HOSPITAL_COMMUNITY): Payer: Self-pay | Admitting: *Deleted

## 2020-12-13 ENCOUNTER — Other Ambulatory Visit: Payer: Self-pay

## 2020-12-13 VITALS — BP 172/84 | Wt 200.8 lb

## 2020-12-13 DIAGNOSIS — I5022 Chronic systolic (congestive) heart failure: Secondary | ICD-10-CM | POA: Diagnosis not present

## 2020-12-13 DIAGNOSIS — I10 Essential (primary) hypertension: Secondary | ICD-10-CM

## 2020-12-13 NOTE — Patient Instructions (Addendum)
Start taking Entresto 24/26 (1/2 tablet) by mouth twice a day. Increase to 49/51mg  twice a day after 1 week if tolerating well.  Continue carvedilol 25mg  twice a day, hydralazine 100mg  three times a day, isosorbide 60mg  daily and spironolactone 25mg  daily  DECREASE your furosemide to 10mg  daily  Call me at [phone] with any questions  Please check your blood pressure at home once a day- record those readings and bring them with you to your next appointment

## 2020-12-13 NOTE — Telephone Encounter (Signed)
Pt returning call regarding upcoming cardiac imaging study; pt verbalizes understanding of appt date/time, parking situation and where to check in, pre-test NPO status and medications ordered, and verified current allergies; name and call back number provided for further questions should they arise  Gordy Clement RN Navigator Cardiac Imaging [facility] [phone] office [phone] cell  Pt did not have metoprolol but due to baseline HR being 63, pt advised to take his morning carvedilol dose 2 hours prior to cardiac CT scan. Pt aware that he may be getting IV medications to lower HR during CT appt.

## 2020-12-13 NOTE — Progress Notes (Signed)
Patient ID: Matthew Nixon                 DOB: May 28, 1961                      MRN: [id number]     HPI: Matthew Nixon is a 60 y.o. male referred by Matthew Nixon to pharmacy clinic for HF medication management. PMH is significant for DCM with EF 32% (echo 2014) and repeat echo 02/2017 with improved LVF with EF 40-45% and no ischemia on nuclear stress, chronic systolic heart failure NYHA class II, COPD, hypertension, left bundle branch block and obstructive sleep apnea.. Most recent LVEF 30-35% on 04/22/2019.  Today he presents to pharmacy clinic for further medication titration. At last visit with MD he reported chronic DOE, fatigue and waking up at night gasping for air (has flashbacks from 911 and OSA). Per Matthew Nixon, increased Delene Loll to 49/51mg  BID and start Farxiga 10mg . HCTZ was stopped. He was scheduled for repeat echo and a coronary CTA.   Patient presents today to clinic. He reports that he did not pick up the [facility] because it was $135. He has never been on Entresto. He also is concerned about his kidneys. States one time he went into kidney failure for no reason. He is also concerned because he goes to the [place] for months at a time (especially when it is cold here). He does not think he will be able to get brand named drugs over in the [place]. He has tried to have his meds mailed to him there in the past , but they always get lost or they take 3 months to come.  He has not started [place] yet either. States he didn't know anything about it. Still taking HCTZ. Blood pressure at home in the 150's/80-90's. His weight today at home was 198lb. He does not check every day. He usually does 500 push-ups a day. Cannot do that many any more but still tries to do as many as he can. Tries hard to stay in shape. Watches his sodium and caffeine intake. Only taking hydralazine twice a day. Is able to lie flat without SOB.   SCr 1.09, K+ 4.4, BNP 93 in April 2022 11/30/20 Scr 1, K  4.3  Current CHF meds: carvedilol 25mg  twice a day, digoxin 0.125mg  daily, furosemide 20mg  daily, hydralazine 100mg  two times a day, isosorbide 60mg  daily and spironolactone 25mg  daily HTN meds: amlodipine 10mg  daily, doxazosin 8mg  daily Previously tried:  BP goal: <130/80  Family History: The patient's family history includes Heart disease in his father and mother. There is no history of Colon cancer, Esophageal cancer, Pancreatic cancer, or Stomach cancer  Social History: + ETOH, never smoked  Diet: watches sodium, caffeine and sugar intake  Exercise: 100-500 push-up per day  Home BP readings: 510/25-85  Wt Readings from Last 3 Encounters:  11/23/20 197 lb 12.8 oz (89.7 kg)  10/10/20 197 lb 12.8 oz (89.7 kg)  12/22/19 197 lb (89.4 kg)   BP Readings from Last 3 Encounters:  11/23/20 140/90  10/10/20 (!) 148/80  12/22/19 (!) 155/90   Pulse Readings from Last 3 Encounters:  11/23/20 63  10/10/20 83  12/22/19 86    Renal function: CrCl cannot be calculated (Unknown ideal weight.).  Past Medical History:  Diagnosis Date   Allergy    Cardiomyopathy ([place])    DCM with EF 32% on echo 2014 and no ischemia on nuclear stress test.  EF improved  to 40-45% by echo 02/2017   Chronic back pain    low back   Chronic systolic CHF (congestive heart failure), NYHA class 2 ([place]) 08/07/2016   COPD (chronic obstructive pulmonary disease) (HCC)    Dyspnea    with exertion   EKG, abnormal    history of left bundle block since 2002 per pt   GERD (gastroesophageal reflux disease)    Headache    Hypertension    LBBB (left bundle branch block)    OSA on CPAP 08/07/2016   Oxygen deficiency    Pain    right side pain for many years   PTSD (post-traumatic stress disorder)    Sleep apnea     Current Outpatient Medications on File Prior to Visit  Medication Sig Dispense Refill   albuterol (PROVENTIL HFA;VENTOLIN HFA) 108 (90 Base) MCG/ACT inhaler Inhale 2 puffs into the lungs every 6 (six)  hours as needed for wheezing or shortness of breath. 1 Inhaler 0   amLODipine (NORVASC) 10 MG tablet Take 1 tablet (10 mg total) by mouth daily. 90 tablet 3   aspirin 81 MG tablet Take 81 mg by mouth daily.     budesonide-formoterol (SYMBICORT) 80-4.5 MCG/ACT inhaler Inhale 2 puffs into the lungs 2 (two) times daily. 1 Inhaler 3   carvedilol (COREG) 25 MG tablet Take 1 tablet (25 mg total) by mouth 2 (two) times daily with a meal. 180 tablet 3   cetirizine (ZYRTEC) 10 MG tablet Take 10 mg by mouth daily.     dapagliflozin propanediol (FARXIGA) 10 MG TABS tablet Take 1 tablet (10 mg total) by mouth daily before breakfast. 90 tablet 3   diclofenac sodium (VOLTAREN) 1 % GEL Apply 2 g topically 4 (four) times daily. 3 Tube 2   digoxin (LANOXIN) 0.125 MG tablet Take 1 tablet (0.125 mg total) by mouth daily. 90 tablet 3   doxazosin (CARDURA) 8 MG tablet Take 1 tablet (8 mg total) by mouth daily. 90 tablet 3   fluticasone (FLONASE) 50 MCG/ACT nasal spray Place 2 sprays into both nostrils daily. 16 g 1   furosemide (LASIX) 20 MG tablet Take 1 tablet (20 mg total) by mouth daily. 90 tablet 3   hydrALAZINE (APRESOLINE) 100 MG tablet Take 1 tablet (100 mg total) by mouth 3 (three) times daily. 270 tablet 3   hydrOXYzine (ATARAX/VISTARIL) 10 MG tablet 1-2 tab po q 8 hours as needed itching. 30 tablet 0   ipratropium (ATROVENT HFA) 17 MCG/ACT inhaler Inhale 2 puffs into the lungs every 6 (six) hours as needed for wheezing. 1 Inhaler 3   ISOSORBIDE MONONITRATE PO Take 60 mg by mouth 2 (two) times daily.     meclizine (ANTIVERT) 12.5 MG tablet Take 1 tablet (12.5 mg total) by mouth 3 (three) times daily as needed for dizziness. 30 tablet 0   metoprolol tartrate (LOPRESSOR) 100 MG tablet Take 1 tablet (100 mg total) by mouth once for 1 dose. Take 1 tablet two hours prior to CT scan. 1 tablet 0   mometasone-formoterol (DULERA) 200-5 MCG/ACT AERO Inhale 2 puffs into the lungs 2 (two) times daily. 13 g 1    montelukast (SINGULAIR) 10 MG tablet Take 1 tablet (10 mg total) by mouth at bedtime. 30 tablet 3   omeprazole (PRILOSEC) 40 MG capsule Take 1 capsule (40 mg total) by mouth daily. 30 capsule 3   ondansetron (ZOFRAN ODT) 8 MG disintegrating tablet Take 1 tablet (8 mg total) by mouth every 8 (eight) hours as  needed for nausea or vomiting. 20 tablet 0   ranitidine (ZANTAC) 150 MG capsule Take 1 capsule (150 mg total) by mouth 2 (two) times daily. 60 capsule 6   sacubitril-valsartan (ENTRESTO) 49-51 MG Take 1 tablet by mouth 2 (two) times daily. 60 tablet 1   spironolactone (ALDACTONE) 25 MG tablet Take 1 tablet (25 mg total) by mouth daily. 90 tablet 3   tobramycin (TOBREX) 0.3 % ophthalmic solution Place 2 drops into the left eye every 6 (six) hours. 5 mL 0   traMADol (ULTRAM) 50 MG tablet 1-2 every 4 hours as needed for cough or pain 40 tablet 0   No current facility-administered medications on file prior to visit.    No Known Allergies   Assessment/Plan:  1. CHF - Patient did not start Entresto due to concerns over kidneys, cost and accessibility in the [place]. We discussed the positive effect ARNI can have, the 4 pillars in HF and the importance also in lowering his blood pressure. Advised that we can use a $10 coupon to lower his cost and can fill a 90 day supply before [place] leaves [place]. I activated a copay card and called it into his pharmacy while patient was in the office. Copay card worked. Patient agreeable to starting Entresto. Since he is ACE/ARB/ARNI nieve, will start with Entresto 24/26mg  twice a day. He was instructed to take 1/2 of the 49/51mg  tablet twice a day for a week. He will get BMP checked when he is here for Echo next Wed. If lab work looks good and he is tolerating, will increase to 49/51mg  BID. I have asked him to decrease his furosemide to 10mg  daily to avoid over diuresis with Entresto. Stop HCTZ. Continue spironolactone 25mg  daily, carvedilol 25mg  BID, digoxin  0.125mg  daily, hydralazine three times a day and isosorbide 60mg  daily. I have reminded him that his hydralazine should be 3 times a day. Follow up in clinic in 3 weeks. We will address Farxiga at future visits. Patient advised to check blood pressure and weight daily.  2. HTN- Blood pressure elevated in clinic today above goal of <130/80. We talked about the adverse effects of long term elevated blood pressure including the probable cause of his HF and kidney damage. Will start Entresto as plan spelled out above. Continue spironolactone 25mg  daily, carvedilol 25mg  BID, hydralazine three times a day, isosorbide 60mg  daily, amlodipine 10mg  and doxazosin 8mg  daily. Follow up in 3 weeks in clinic. Check blood pressure daily, bring readings to next apt. BMP in 1 week.   Ramond Dial, Pharm.D, BCPS, CPP [place]  [address] [place], [place] [address]  Phone: [phone]; Fax: [phone]

## 2020-12-14 ENCOUNTER — Encounter: Payer: Self-pay | Admitting: Cardiology

## 2020-12-14 ENCOUNTER — Ambulatory Visit (HOSPITAL_COMMUNITY)
Admission: RE | Admit: 2020-12-14 | Discharge: 2020-12-14 | Disposition: A | Discharge status: Home / Self Care | Payer: Medicare Other | Source: Ambulatory Visit | Attending: Cardiology | Admitting: Cardiology

## 2020-12-14 DIAGNOSIS — R072 Precordial pain: Secondary | ICD-10-CM

## 2020-12-14 DIAGNOSIS — I2510 Atherosclerotic heart disease of native coronary artery without angina pectoris: Secondary | ICD-10-CM | POA: Insufficient documentation

## 2020-12-14 MED ORDER — NITROGLYCERIN 0.4 MG SL SUBL: 0.8000 mg | SUBLINGUAL_TABLET | SUBLINGUAL | Status: DC | PRN

## 2020-12-14 MED ORDER — IOHEXOL 350 MG/ML SOLN
100.0000 mL | Freq: Once | INTRAVENOUS | Status: AC | PRN
  Administered 2020-12-14: 100 mL via INTRAVENOUS

## 2020-12-14 MED ORDER — METOPROLOL TARTRATE 5 MG/5ML IV SOLN: 5.0000 mg | INTRAVENOUS | Status: DC | PRN

## 2020-12-14 MED ORDER — METOPROLOL TARTRATE 5 MG/5ML IV SOLN
INTRAVENOUS | Status: AC
  Administered 2020-12-14: 5 mg via INTRAVENOUS
  Filled 2020-12-14: qty 5

## 2020-12-14 MED ORDER — NITROGLYCERIN 0.4 MG SL SUBL
SUBLINGUAL_TABLET | SUBLINGUAL | Status: AC
  Administered 2020-12-14: 0.8 mg via SUBLINGUAL
  Filled 2020-12-14: qty 2

## 2020-12-17 ENCOUNTER — Telehealth: Payer: Self-pay

## 2020-12-17 DIAGNOSIS — I2510 Atherosclerotic heart disease of native coronary artery without angina pectoris: Secondary | ICD-10-CM

## 2020-12-17 DIAGNOSIS — I420 Dilated cardiomyopathy: Secondary | ICD-10-CM

## 2020-12-17 NOTE — Telephone Encounter (Signed)
The patient has been notified of the result and verbalized understanding.  All questions (if any) were answered. Antonieta Iba, RN 12/17/2020 5:12 PM  Labs have been scheduled.

## 2020-12-17 NOTE — Telephone Encounter (Signed)
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 This is not the cause of his SOB, CP or fatigue.  Please have him come in for FLP and ALT.

## 2020-12-19 ENCOUNTER — Other Ambulatory Visit: Payer: Medicare Other | Admitting: *Deleted

## 2020-12-19 ENCOUNTER — Other Ambulatory Visit: Payer: Self-pay

## 2020-12-19 ENCOUNTER — Ambulatory Visit (HOSPITAL_COMMUNITY): Payer: Commercial Managed Care - PPO | Attending: Cardiology

## 2020-12-19 DIAGNOSIS — I5022 Chronic systolic (congestive) heart failure: Secondary | ICD-10-CM

## 2020-12-19 DIAGNOSIS — I10 Essential (primary) hypertension: Secondary | ICD-10-CM | POA: Diagnosis not present

## 2020-12-19 DIAGNOSIS — I420 Dilated cardiomyopathy: Secondary | ICD-10-CM

## 2020-12-19 LAB — ECHOCARDIOGRAM COMPLETE
Area-P 1/2: 3.63 cm2
Calc EF: 30.9 %
S' Lateral: 4.7 cm
Single Plane A2C EF: 28.6 %
Single Plane A4C EF: 29.8 %

## 2020-12-19 LAB — BASIC METABOLIC PANEL
BUN/Creatinine Ratio: 11 (ref 10–24)
BUN: 13 mg/dL (ref 8–27)
CO2: 23 mmol/L (ref 20–29)
Calcium: 9.9 mg/dL (ref 8.6–10.2)
Chloride: 101 mmol/L (ref 96–106)
Creatinine, Ser: 1.14 mg/dL (ref 0.76–1.27)
Glucose: 103 mg/dL — ABNORMAL HIGH (ref 65–99)
Potassium: 4.8 mmol/L (ref 3.5–5.2)
Sodium: 136 mmol/L (ref 134–144)
eGFR: 73 mL/min/{1.73_m2} (ref >59)

## 2020-12-20 ENCOUNTER — Telehealth: Payer: Self-pay | Admitting: Pharmacist

## 2020-12-20 ENCOUNTER — Telehealth: Payer: Self-pay

## 2020-12-20 DIAGNOSIS — I428 Other cardiomyopathies: Secondary | ICD-10-CM

## 2020-12-20 NOTE — Telephone Encounter (Signed)
Called pt and LVM for him to call back Calling pt to let him know that his labs are stable on Entresto. Will confirm which dose of Entresto he is currently taking.

## 2020-12-20 NOTE — Telephone Encounter (Signed)
Reviewed results with patient. He is taking Entresto 49/51mg  BID. He reports that he has increase in cough at night. He also has lung disease, OSA and GERD which can cause cough. He will continue Entresto 49/51mg  BID and follow up in clinic 7/14

## 2020-12-20 NOTE — Telephone Encounter (Signed)
The patient has been notified of the result and verbalized understanding.  All questions (if any) were answered. Antonieta Iba, RN 12/20/2020 12:06 PM  Referral has been placed.

## 2020-12-20 NOTE — Addendum Note (Signed)
Addended by: Antonieta Iba on: 12/20/2020 01:29 PM   Modules accepted: Orders

## 2020-12-20 NOTE — Telephone Encounter (Signed)
-----   Message from Sueanne Margarita, MD sent at 12/19/2020  2:37 PM EDT ----- Echo showed moderately reduced LVF with EF 30-35% and mild to moderately dilated with mildly thickened and stiff heart muscle.  The RVF is mildly reduced. NO change from 03/2019.  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  Please refer to EP Dr. Lovena Le for consideration of BiVAICD for nonischemic DCM on max GDMT.

## 2021-01-03 ENCOUNTER — Other Ambulatory Visit: Payer: Self-pay

## 2021-01-03 ENCOUNTER — Ambulatory Visit (INDEPENDENT_AMBULATORY_CARE_PROVIDER_SITE_OTHER): Payer: Medicare Other | Admitting: Pharmacist

## 2021-01-03 VITALS — BP 156/82 | HR 78 | Wt 197.0 lb

## 2021-01-03 DIAGNOSIS — I5022 Chronic systolic (congestive) heart failure: Secondary | ICD-10-CM | POA: Diagnosis not present

## 2021-01-03 NOTE — Progress Notes (Signed)
Patient ID: Matthew Nixon                 DOB: 1961/03/21                      MRN: [id number]     HPI: Matthew Nixon is a 60 y.o. male referred by Dr. Radford Pax to pharmacy clinic for HF medication management. PMH is significant for chronic systolic heart failure NYHA class II, NICM with EF 32% (echo 2014) , improvement to 40-45% (echo 02/2017) and no ischemia on nuclear stress, 30 -35% (03/2019 echo), most recently 30-35% on 11/2020 echo with no changes from 03/2019 echo, coronary calcium score of 16 (56th percentile) with 25-49% RCA stenosis, COPD, HTN, left bundle branch block and OSA.    At first visit to PharmD clinic patient reported he did not pick up Entresto due to cost and renal concerns.  [name] PharmD was able to reduce patient copay and he was able to pick up.  Medication was then further titrated up to 49-51mg  BID.   Patient presents today for second CHF follow up.  Reports continued SOB especially with exertion, but also occasionally at rest. Has occasional chest pain.  No swelling noted in extremities.  Is tolerating increased dose of Entresto and reports BP has been slowly trending down however he does not trust his home BP cuff ([facility] upper arm cuff).  Had patient check BP using home cuff.    Patient checked three times: 176/90 170/92 160/94  Is still planning on moving to [place] but does not have a date set yet.  Has been doing research and he does not think it will be possible for him to get [name], or [place].  Remains worried about his renal function despite minor Scr elevation since increasing Entresto.  Is not interested in starting [place] because he says he feels like he is on too many medications already. Also concerned regarding costs of brand named medications.  Has begun to take hydralazine three times daily but reports he will occasionally miss/forget about midday dose. Also causes him headaches.    SCr 1.09, K+ 4.4, BNP 93 in April  2022 11/30/20 Scr 1, K 4.3   Current CHF meds:  carvedilol 25mg  twice a day digoxin 0.125mg  daily,  furosemide 10mg  daily,  hydralazine 100mg  two times a day,  isosorbide 60mg  daily spironolactone 25mg  daily  HTN meds: amlodipine 10mg  daily, doxazosin 8mg  daily  BP goal: <130/80  Family History: The patient's family history includes Heart disease in his father and mother. There is no history of Colon cancer, Esophageal cancer, Pancreatic cancer, or Stomach cancer  Social History: + ETOH, never smoked  Diet: watches sodium, caffeine and sugar intake  Exercise: 100-500 push-up per day   Wt Readings from Last 3 Encounters:  12/13/20 200 lb 12.8 oz (91.1 kg)  11/23/20 197 lb 12.8 oz (89.7 kg)  10/10/20 197 lb 12.8 oz (89.7 kg)   BP Readings from Last 3 Encounters:  12/14/20 (!) 155/74  12/13/20 (!) 172/84  11/23/20 140/90   Pulse Readings from Last 3 Encounters:  12/14/20 (!) 59  11/23/20 63  10/10/20 83    Renal function: CrCl cannot be calculated (Unknown ideal weight.).  Past Medical History:  Diagnosis Date   Allergy    CAD (coronary artery disease), native coronary artery    25-49% prox to mid RCA by coronary CTA 11/2020   Cardiomyopathy ([place])    DCM with EF 32% on  echo 2014 and no ischemia on nuclear stress test.  EF improved to 40-45% by echo 02/2017   Chronic back pain    low back   Chronic systolic CHF (congestive heart failure), NYHA class 2 ([place]) 08/07/2016   COPD (chronic obstructive pulmonary disease) ([place])    Dyspnea    with exertion   EKG, abnormal    history of left bundle block since 2002 per pt   GERD (gastroesophageal reflux disease)    Headache    Hypertension    LBBB (left bundle branch block)    OSA on CPAP 08/07/2016   Oxygen deficiency    Pain    right side pain for many years   PTSD (post-traumatic stress disorder)    Sleep apnea     Current Outpatient Medications on File Prior to Visit  Medication Sig Dispense Refill    albuterol (PROVENTIL HFA;VENTOLIN HFA) 108 (90 Base) MCG/ACT inhaler Inhale 2 puffs into the lungs every 6 (six) hours as needed for wheezing or shortness of breath. 1 Inhaler 0   amLODipine (NORVASC) 10 MG tablet Take 1 tablet (10 mg total) by mouth daily. 90 tablet 3   aspirin 81 MG tablet Take 81 mg by mouth daily.     budesonide-formoterol (SYMBICORT) 80-4.5 MCG/ACT inhaler Inhale 2 puffs into the lungs 2 (two) times daily. 1 Inhaler 3   carvedilol (COREG) 25 MG tablet Take 1 tablet (25 mg total) by mouth 2 (two) times daily with a meal. 180 tablet 3   cetirizine (ZYRTEC) 10 MG tablet Take 10 mg by mouth daily.     digoxin (LANOXIN) 0.125 MG tablet Take 1 tablet (0.125 mg total) by mouth daily. 90 tablet 3   doxazosin (CARDURA) 8 MG tablet Take 1 tablet (8 mg total) by mouth daily. 90 tablet 3   fluticasone (FLONASE) 50 MCG/ACT nasal spray Place 2 sprays into both nostrils daily. 16 g 1   furosemide (LASIX) 20 MG tablet Take 0.5 tablets (10 mg total) by mouth daily. 90 tablet 3   hydrALAZINE (APRESOLINE) 100 MG tablet Take 1 tablet (100 mg total) by mouth 3 (three) times daily. 270 tablet 3   hydrOXYzine (ATARAX/VISTARIL) 10 MG tablet 1-2 tab po q 8 hours as needed itching. 30 tablet 0   ipratropium (ATROVENT HFA) 17 MCG/ACT inhaler Inhale 2 puffs into the lungs every 6 (six) hours as needed for wheezing. 1 Inhaler 3   ISOSORBIDE MONONITRATE PO Take 60 mg by mouth daily.     meclizine (ANTIVERT) 12.5 MG tablet Take 1 tablet (12.5 mg total) by mouth 3 (three) times daily as needed for dizziness. 30 tablet 0   metoprolol tartrate (LOPRESSOR) 100 MG tablet Take 1 tablet (100 mg total) by mouth once for 1 dose. Take 1 tablet two hours prior to CT scan. 1 tablet 0   mometasone-formoterol (DULERA) 200-5 MCG/ACT AERO Inhale 2 puffs into the lungs 2 (two) times daily. 13 g 1   omeprazole (PRILOSEC) 40 MG capsule Take 1 capsule (40 mg total) by mouth daily. 30 capsule 3   ondansetron (ZOFRAN ODT) 8 MG  disintegrating tablet Take 1 tablet (8 mg total) by mouth every 8 (eight) hours as needed for nausea or vomiting. 20 tablet 0   ranitidine (ZANTAC) 150 MG capsule Take 1 capsule (150 mg total) by mouth 2 (two) times daily. 60 capsule 6   sacubitril-valsartan (ENTRESTO) 49-51 MG Take 1 tablet by mouth 2 (two) times daily. (Patient not taking: Reported on 12/13/2020) 60 tablet  1   spironolactone (ALDACTONE) 25 MG tablet Take 1 tablet (25 mg total) by mouth daily. 90 tablet 3   No current facility-administered medications on file prior to visit.    No Known Allergies   Assessment/Plan:  1. CHF -   Patient BP in room today 156/82 which is above goal of <130/80 and lower than his home cuff readings.  Discussed importance of Entresto in treating CHF, however patient remains concerned about renal function and lack of availability in [place].  Advised that we could change from Entresto to valsartan when patient knows when he will move as that may be more easily accessible to him.  Recommended increasing Entresto to max dose 97-103 but patient is hesitant.  Compromised and decided we can recheck BMP in 1 week after patient has been on Entresto 49-51 for a longer period of time and if renal function stable, then discuss increasing to 97-103.  Recommended starting Farxiga but patient does not want to at this time due to medication burden and his expectation it will not be avilable in [place]>  Will continue current HF regimen at this time:  Continue spironolactone 25mg  daily Continue carvedilol 25mg  BID,  Continue Digoxin 0.125mg  daily,  Continue hydralazine three times a day (advised that if midday dose gives him headaches he can take 1/2 tablet) Continue isosorbide 60mg  daily. Continue monitoring weight and BP at home Check BMP in 1 week  [name], PharmD, BCACP, [place], [place] [address], [place], [place] [address] Phone: [phone]; Fax: [phone] 01/03/2021 12:03 PM

## 2021-01-03 NOTE — Patient Instructions (Addendum)
It was nice meeting you today!  We would like your blood pressure to be less than 130/80  Continue your Entresto 49-51mg  twice a day Continue digoxin 0.125mg  once a day Continue carvedilol 25mg  twice a day Continue spironolactone 25mg  once a day Continue hydralazine 100mg  three times a day Continue isosorbide 60mg  daily Continue amlodipine 10mg  daily  We will check your lab work in 1 week to see if the Delene Loll is affecting your kidneys.    Continue to check your blood pressure and weights at home  Please call when you know when you are leaving for the [place], PharmD, BCACP, [place], [place]. [address], [place], [place] [address] Phone: [phone]; Fax: [phone] 01/03/2021 10:35 AM

## 2021-01-04 ENCOUNTER — Encounter: Payer: Self-pay | Admitting: Medical

## 2021-01-04 ENCOUNTER — Ambulatory Visit (INDEPENDENT_AMBULATORY_CARE_PROVIDER_SITE_OTHER): Payer: Medicare Other | Admitting: Medical

## 2021-01-04 VITALS — BP 150/70 | HR 70 | Resp 18 | Ht 69.0 in | Wt 199.0 lb

## 2021-01-04 DIAGNOSIS — R6882 Decreased libido: Secondary | ICD-10-CM

## 2021-01-04 DIAGNOSIS — R7989 Other specified abnormal findings of blood chemistry: Secondary | ICD-10-CM | POA: Diagnosis not present

## 2021-01-04 DIAGNOSIS — I420 Dilated cardiomyopathy: Secondary | ICD-10-CM

## 2021-01-04 DIAGNOSIS — R5383 Other fatigue: Secondary | ICD-10-CM

## 2021-01-04 DIAGNOSIS — I5022 Chronic systolic (congestive) heart failure: Secondary | ICD-10-CM

## 2021-01-04 DIAGNOSIS — I2510 Atherosclerotic heart disease of native coronary artery without angina pectoris: Secondary | ICD-10-CM

## 2021-01-04 LAB — CBC WITH DIFFERENTIAL/PLATELET
Basophils Absolute: 0 10*3/uL (ref 0.0–0.1)
Basophils Relative: 0.4 % (ref 0.0–3.0)
Eosinophils Absolute: 0.1 10*3/uL (ref 0.0–0.7)
Eosinophils Relative: 2.5 % (ref 0.0–5.0)
HCT: 48 % (ref 39.0–52.0)
Hemoglobin: 16.4 g/dL (ref 13.0–17.0)
Lymphocytes Relative: 33.9 % (ref 12.0–46.0)
Lymphs Abs: 1.9 10*3/uL (ref 0.7–4.0)
MCHC: 34.2 g/dL (ref 30.0–36.0)
MCV: 94.8 fl (ref 78.0–100.0)
Monocytes Absolute: 0.5 10*3/uL (ref 0.1–1.0)
Monocytes Relative: 9.6 % (ref 3.0–12.0)
Neutro Abs: 2.9 10*3/uL (ref 1.4–7.7)
Neutrophils Relative %: 53.6 % (ref 43.0–77.0)
Platelets: 213 10*3/uL (ref 150.0–400.0)
RBC: 5.06 Mil/uL (ref 4.22–5.81)
RDW: 13.4 % (ref 11.5–15.5)
WBC: 5.5 10*3/uL (ref 4.0–10.5)

## 2021-01-04 LAB — TSH: TSH: 2.71 u[IU]/mL (ref 0.35–5.50)

## 2021-01-04 LAB — T4, FREE: Free T4: 1.17 ng/dL (ref 0.60–1.60)

## 2021-01-04 NOTE — Progress Notes (Signed)
Subjective:    Patient ID: Matthew Nixon, male    DOB: [birth year], 60 y.o.   MRN: [id number]  HPI  Pt in for follow up. Just saw cardiologist beginning of June. Pt has hx of chf/dilated cardiomyopathy.Plan of that visit below.   Medication Instructions:  Your physician has recommended you make the following change in your medication:  1) INCREASE Entresto to 49-51 mg twice daily 2) STOP taking hydrochlorothiazide 3) START takinf Farxiga 10 mg daily   Pt also saw pharmacist. A/P  Will continue current HF regimen at this time:   Continue spironolactone 25mg  daily Continue carvedilol 25mg  BID,  Continue Digoxin 0.125mg  daily,  Continue hydralazine three times a day (advised that if midday dose gives him headaches he can take 1/2 tablet) Continue isosorbide 60mg  daily. Continue monitoring weight and BP at home Check BMP in 1 week  Pt has gained some weight. In that past he has been trying to loose weight. Has difficulty compared to the past.   Mild elevated sugar in the past.  Pt has some mild fatigue. Some decreased libido.   Review of Systems  Constitutional:  Positive for fatigue. Negative for chills and fever.  HENT:  Negative for congestion.   Respiratory:  Negative for cough, chest tightness, shortness of breath and wheezing.   Cardiovascular:  Negative for chest pain and palpitations.  Gastrointestinal:  Negative for abdominal pain.  Musculoskeletal:  Negative for back pain, joint swelling, myalgias and neck pain.  Skin:  Negative for rash.  Neurological:  Negative for dizziness, seizures, syncope, weakness and light-headedness.  Hematological:  Negative for adenopathy. Does not bruise/bleed easily.  Psychiatric/Behavioral:  Negative for behavioral problems, confusion, hallucinations and sleep disturbance. The patient is not nervous/anxious.    Past Medical History:  Diagnosis Date   Allergy    CAD (coronary artery disease), native coronary artery    25-49%  prox to mid RCA by coronary CTA 11/2020   Cardiomyopathy [facility])    DCM with EF 32% on echo 2014 and no ischemia on nuclear stress test.  EF improved to 40-45% by echo 02/2017   Chronic back pain    low back   Chronic systolic CHF (congestive heart failure), NYHA class 2 ([facility]) 08/07/2016   COPD (chronic obstructive pulmonary disease) ([facility])    Dyspnea    with exertion   EKG, abnormal    history of left bundle block since 2002 per pt   GERD (gastroesophageal reflux disease)    Headache    Hypertension    LBBB (left bundle branch block)    OSA on CPAP 08/07/2016   Oxygen deficiency    Pain    right side pain for many years   PTSD (post-traumatic stress disorder)    Sleep apnea      Social History   Socioeconomic History   Marital status: Married    Spouse name: Not on file   Number of children: Not on file   Years of education: Not on file   Highest education level: Not on file  Occupational History   Not on file  Tobacco Use   Smoking status: Never   Smokeless tobacco: Never  Vaping Use   Vaping Use: Never used  Substance and Sexual Activity   Alcohol use: No   Drug use: No   Sexual activity: Not on file  Other Topics Concern   Not on file  Social History Narrative   2 years college    Social Determinants of Health  Financial Resource Strain: Not on file  Food Insecurity: Not on file  Transportation Needs: Not on file  Physical Activity: Not on file  Stress: Not on file  Social Connections: Not on file  Intimate Partner Violence: Not on file    Past Surgical History:  Procedure Laterality Date   ANKLE SURGERY Right    CARDIAC CATHETERIZATION  yrs ago   ESOPHAGOGASTRODUODENOSCOPY (EGD) WITH PROPOFOL N/A 01/13/2017   Procedure: ESOPHAGOGASTRODUODENOSCOPY (EGD) WITH PROPOFOL;  Surgeon: Irene Shipper, MD;  Location: [facility];  Service: Endoscopy;  Laterality: N/A;   HAND SURGERY     Right   HAND SURGERY     Left carpel tunnel   THUMB ARTHROSCOPY Left      Family History  Problem Relation Age of Onset   Heart disease Mother    Heart disease Father    Colon cancer Neg Hx    Esophageal cancer Neg Hx    Pancreatic cancer Neg Hx    Stomach cancer Neg Hx     No Known Allergies  Current Outpatient Medications on File Prior to Visit  Medication Sig Dispense Refill   albuterol (PROVENTIL HFA;VENTOLIN HFA) 108 (90 Base) MCG/ACT inhaler Inhale 2 puffs into the lungs every 6 (six) hours as needed for wheezing or shortness of breath. 1 Inhaler 0   amLODipine (NORVASC) 10 MG tablet Take 1 tablet (10 mg total) by mouth daily. 90 tablet 3   aspirin 81 MG tablet Take 81 mg by mouth daily.     budesonide-formoterol (SYMBICORT) 80-4.5 MCG/ACT inhaler Inhale 2 puffs into the lungs 2 (two) times daily. 1 Inhaler 3   carvedilol (COREG) 25 MG tablet Take 1 tablet (25 mg total) by mouth 2 (two) times daily with a meal. 180 tablet 3   cetirizine (ZYRTEC) 10 MG tablet Take 10 mg by mouth daily.     digoxin (LANOXIN) 0.125 MG tablet Take 1 tablet (0.125 mg total) by mouth daily. 90 tablet 3   doxazosin (CARDURA) 8 MG tablet Take 1 tablet (8 mg total) by mouth daily. 90 tablet 3   fluticasone (FLONASE) 50 MCG/ACT nasal spray Place 2 sprays into both nostrils daily. 16 g 1   furosemide (LASIX) 20 MG tablet Take 0.5 tablets (10 mg total) by mouth daily. 90 tablet 3   hydrALAZINE (APRESOLINE) 100 MG tablet Take 1 tablet (100 mg total) by mouth 3 (three) times daily. 270 tablet 3   hydrOXYzine (ATARAX/VISTARIL) 10 MG tablet 1-2 tab po q 8 hours as needed itching. 30 tablet 0   ipratropium (ATROVENT HFA) 17 MCG/ACT inhaler Inhale 2 puffs into the lungs every 6 (six) hours as needed for wheezing. 1 Inhaler 3   ISOSORBIDE MONONITRATE PO Take 60 mg by mouth daily.     meclizine (ANTIVERT) 12.5 MG tablet Take 1 tablet (12.5 mg total) by mouth 3 (three) times daily as needed for dizziness. 30 tablet 0   metoprolol tartrate (LOPRESSOR) 100 MG tablet Take 1 tablet (100 mg  total) by mouth once for 1 dose. Take 1 tablet two hours prior to CT scan. 1 tablet 0   mometasone-formoterol (DULERA) 200-5 MCG/ACT AERO Inhale 2 puffs into the lungs 2 (two) times daily. 13 g 1   omeprazole (PRILOSEC) 40 MG capsule Take 1 capsule (40 mg total) by mouth daily. 30 capsule 3   ondansetron (ZOFRAN ODT) 8 MG disintegrating tablet Take 1 tablet (8 mg total) by mouth every 8 (eight) hours as needed for nausea or vomiting. 20 tablet  0   ranitidine (ZANTAC) 150 MG capsule Take 1 capsule (150 mg total) by mouth 2 (two) times daily. 60 capsule 6   sacubitril-valsartan (ENTRESTO) 49-51 MG Take 1 tablet by mouth 2 (two) times daily. 60 tablet 1   spironolactone (ALDACTONE) 25 MG tablet Take 1 tablet (25 mg total) by mouth daily. 90 tablet 3   No current facility-administered medications on file prior to visit.    BP (!) 150/70   Pulse 70   Resp 18   Ht 5\' 9"  (1.753 m)   Wt 199 lb (90.3 kg)   SpO2 99%   BMI 29.39 kg/m        Objective:   Physical Exam  General Mental Status- Alert. General Appearance- Not in acute distress.   Skin General: Color- Normal Color. Moisture- Normal Moisture.  Neck Carotid Arteries- Normal color. Moisture- Normal Moisture. No carotid bruits. No JVD.  Chest and Lung Exam Auscultation: Breath Sounds:-Normal.  Cardiovascular Auscultation:Rythm- Regular. Murmurs & Other Heart Sounds:Auscultation of the heart reveals- No Murmurs.  Abdomen Inspection:-Inspeection Normal. Palpation/Percussion:Note:No mass. Palpation and Percussion of the abdomen reveal- Non Tender, Non Distended + BS, no rebound or guarding.   Neurologic Cranial Nerve exam:- CN III-XII intact(No nystagmus), symmetric smile. Strength:- 5/5 equal and symmetric strength both upper and lower extremities.       Assessment & Plan:   For chf and cardiomyopathy continue with treatment regimen advised by cardiologist.  For fatigue and low libido cbc, tsh, t4 and testosterone  panel.  For recent wt gain/being overweight recommend wt watchers. Also refer to nutritionist as requested.  Follow up in 3 months or as needed.  Mackie Pai, PA-C   Time spent with patient today was  30 minutes which consisted of chart review, discussing diagnosis, work up treatment and documentation.

## 2021-01-04 NOTE — Patient Instructions (Addendum)
For chf and cardiomyopathy continue with treatment regimen advised by cardiologist.  For fatigue and low libido cbc, tsh, t4 and testosterone panel.  For recent wt gain/being overweight recommend wt watchers. Also refer to nutritionist as requested.  Follow up in 3 months or as needed.

## 2021-01-05 LAB — TESTOSTERONE TOTAL,FREE,BIO, MALES
Albumin: 4.2 g/dL (ref 3.6–5.1)
Sex Hormone Binding: 28 nmol/L (ref 22–77)
Testosterone, Bioavailable: 110 ng/dL (ref 110.0–575.0)
Testosterone, Free: 57.1 pg/mL (ref 46.0–224.0)
Testosterone: 377 ng/dL (ref 250–827)

## 2021-01-10 ENCOUNTER — Other Ambulatory Visit: Payer: Self-pay

## 2021-01-10 ENCOUNTER — Other Ambulatory Visit: Payer: Medicare Other

## 2021-01-10 DIAGNOSIS — I5022 Chronic systolic (congestive) heart failure: Secondary | ICD-10-CM

## 2021-01-10 LAB — BASIC METABOLIC PANEL
BUN/Creatinine Ratio: 11 (ref 10–24)
BUN: 11 mg/dL (ref 8–27)
CO2: 25 mmol/L (ref 20–29)
Calcium: 10 mg/dL (ref 8.6–10.2)
Chloride: 101 mmol/L (ref 96–106)
Creatinine, Ser: 0.98 mg/dL (ref 0.76–1.27)
Glucose: 140 mg/dL — ABNORMAL HIGH (ref 65–99)
Potassium: 4.5 mmol/L (ref 3.5–5.2)
Sodium: 137 mmol/L (ref 134–144)
eGFR: 88 mL/min/{1.73_m2} (ref >59)

## 2021-01-24 ENCOUNTER — Ambulatory Visit (INDEPENDENT_AMBULATORY_CARE_PROVIDER_SITE_OTHER): Payer: Medicare Other | Admitting: Pharmacist

## 2021-01-24 ENCOUNTER — Other Ambulatory Visit: Payer: Self-pay

## 2021-01-24 VITALS — BP 152/76 | Wt 198.0 lb

## 2021-01-24 DIAGNOSIS — I10 Essential (primary) hypertension: Secondary | ICD-10-CM | POA: Diagnosis not present

## 2021-01-24 DIAGNOSIS — I5022 Chronic systolic (congestive) heart failure: Secondary | ICD-10-CM

## 2021-01-24 MED ORDER — AMLODIPINE BESYLATE 10 MG PO TABS: 10.0000 mg | ORAL_TABLET | Freq: Every day | ORAL | 3 refills | Status: DC

## 2021-01-24 MED ORDER — FUROSEMIDE 20 MG PO TABS: 10.0000 mg | ORAL_TABLET | Freq: Every day | ORAL | 3 refills | Status: DC

## 2021-01-24 MED ORDER — SPIRONOLACTONE 25 MG PO TABS: 25.0000 mg | ORAL_TABLET | Freq: Every day | ORAL | 3 refills | Status: DC

## 2021-01-24 MED ORDER — DOXAZOSIN MESYLATE 8 MG PO TABS: 8.0000 mg | ORAL_TABLET | Freq: Every day | ORAL | 3 refills | Status: DC

## 2021-01-24 MED ORDER — CARVEDILOL 25 MG PO TABS: 25.0000 mg | ORAL_TABLET | Freq: Two times a day (BID) | ORAL | 3 refills | Status: DC

## 2021-01-24 MED ORDER — DIGOXIN 125 MCG PO TABS: 0.1250 mg | ORAL_TABLET | Freq: Every day | ORAL | 3 refills | Status: DC

## 2021-01-24 NOTE — Progress Notes (Signed)
Patient ID: Matthew Nixon                 DOB: 05/21/1961                      MRN: [id number]     HPI: Matthew Nixon is a 60 y.o. male referred by Matthew Nixon to pharmacy clinic for HF medication management. PMH is significant for chronic systolic heart failure NYHA class II, NICM with EF 32% (echo 2014) , improvement to 40-45% (echo 02/2017) and no ischemia on nuclear stress, 30 -35% (03/2019 echo), most recently 30-35% on 11/2020 echo with no changes from 03/2019 echo, coronary calcium score of 16 (56th percentile) with 25-49% RCA stenosis, COPD, HTN, left bundle branch block and OSA.   At first visit to PharmD clinic patient reported he did not pick up Entresto due to cost and renal concerns.  Matthew Nixon PharmD was able to reduce patient copay and he was able to pick up.  Medication was then further titrated up to 49-51mg  BID. At last visit with Matthew Nixon, patient was hesitant to increase Entresto to 97/103, still concerned about his renal function and ability to get in the DR. Repeat BMP 1 week later showed improvement in scr (there was only a very minor increase initially anyway).   Patient presents today for second CHF follow up.  Reports continued SOB especially with exertion, but also occasionally at rest. Has occasional chest pain.  No swelling noted in extremities.  Is tolerating increased dose of Entresto and reports BP has been slowly trending down however he does not trust his home BP cuff ([facility] upper arm cuff).  Had patient check BP using home cuff.    Patient checked three times: 176/90 170/92 160/94  Is still planning on moving to [place] but does not have a date set yet.  Has been doing research and he does not think it will be possible for him to get [place], or [place].  Remains worried about his renal function despite minor Scr elevation since increasing Entresto.  Is not interested in starting [place] because he says he feels like he is on too many  medications already. Also concerned regarding costs of brand named medications.  Has begun to take hydralazine three times daily but reports he will occasionally miss/forget about midday dose. Also causes him headaches.    Patient presents today for follow up. Follow up labs on 7/21 showed an improved scr. However patient still on 49/51mg . Still concerned about his kidney function. Patient only taking Entresto once a day. States that is what his bottle says. BP at home 149-165/84-90 but cuff in accurate. States his fatigue is better on Entresto. Still a little SOB but he doesn't let it bother him.  SCr 1.09, K+ 4.4, BNP 93 in April 2022 11/30/20 Scr 1, K 4.3 01/10/21 Scr 0.98, K 4.5  Current CHF meds:  carvedilol 25mg  twice a day digoxin 0.125mg  daily,  furosemide 10mg  daily,  hydralazine 100mg  two -three times a day  isosorbide 60mg  daily spironolactone 25mg  daily Entresto 49/51mg  twice a day  HTN meds: amlodipine 10mg  daily, doxazosin 8mg  daily  BP goal: <130/80  Family History: The patient's family history includes Heart disease in his father and mother. There is no history of Colon cancer, Esophageal cancer, Pancreatic cancer, or Stomach cancer  Social History: + ETOH, never smoked  Diet: watches sodium, caffeine and sugar intake  Exercise: 100-500 push-up per day  Wt Readings from  Last 3 Encounters:  01/04/21 199 lb (90.3 kg)  01/03/21 197 lb (89.4 kg)  12/13/20 200 lb 12.8 oz (91.1 kg)   BP Readings from Last 3 Encounters:  01/04/21 (!) 150/70  01/03/21 (!) 156/82  12/14/20 (!) 155/74   Pulse Readings from Last 3 Encounters:  01/04/21 70  01/03/21 78  12/14/20 (!) 59    Renal function: CrCl cannot be calculated (Unknown ideal weight.).  Past Medical History:  Diagnosis Date   Allergy    CAD (coronary artery disease), native coronary artery    25-49% prox to mid RCA by coronary CTA 11/2020   Cardiomyopathy [facility])    DCM with EF 32% on echo 2014 and no ischemia  on nuclear stress test.  EF improved to 40-45% by echo 02/2017   Chronic back pain    low back   Chronic systolic CHF (congestive heart failure), NYHA class 2 ([place]) 08/07/2016   COPD (chronic obstructive pulmonary disease) ([place])    Dyspnea    with exertion   EKG, abnormal    history of left bundle block since 2002 per pt   GERD (gastroesophageal reflux disease)    Headache    Hypertension    LBBB (left bundle branch block)    OSA on CPAP 08/07/2016   Oxygen deficiency    Pain    right side pain for many years   PTSD (post-traumatic stress disorder)    Sleep apnea     Current Outpatient Medications on File Prior to Visit  Medication Sig Dispense Refill   albuterol (PROVENTIL HFA;VENTOLIN HFA) 108 (90 Base) MCG/ACT inhaler Inhale 2 puffs into the lungs every 6 (six) hours as needed for wheezing or shortness of breath. 1 Inhaler 0   amLODipine (NORVASC) 10 MG tablet Take 1 tablet (10 mg total) by mouth daily. 90 tablet 3   aspirin 81 MG tablet Take 81 mg by mouth daily.     budesonide-formoterol (SYMBICORT) 80-4.5 MCG/ACT inhaler Inhale 2 puffs into the lungs 2 (two) times daily. 1 Inhaler 3   carvedilol (COREG) 25 MG tablet Take 1 tablet (25 mg total) by mouth 2 (two) times daily with a meal. 180 tablet 3   cetirizine (ZYRTEC) 10 MG tablet Take 10 mg by mouth daily.     digoxin (LANOXIN) 0.125 MG tablet Take 1 tablet (0.125 mg total) by mouth daily. 90 tablet 3   doxazosin (CARDURA) 8 MG tablet Take 1 tablet (8 mg total) by mouth daily. 90 tablet 3   fluticasone (FLONASE) 50 MCG/ACT nasal spray Place 2 sprays into both nostrils daily. 16 g 1   furosemide (LASIX) 20 MG tablet Take 0.5 tablets (10 mg total) by mouth daily. 90 tablet 3   hydrALAZINE (APRESOLINE) 100 MG tablet Take 1 tablet (100 mg total) by mouth 3 (three) times daily. 270 tablet 3   hydrOXYzine (ATARAX/VISTARIL) 10 MG tablet 1-2 tab po q 8 hours as needed itching. 30 tablet 0   ipratropium (ATROVENT HFA) 17 MCG/ACT  inhaler Inhale 2 puffs into the lungs every 6 (six) hours as needed for wheezing. 1 Inhaler 3   ISOSORBIDE MONONITRATE PO Take 60 mg by mouth daily.     meclizine (ANTIVERT) 12.5 MG tablet Take 1 tablet (12.5 mg total) by mouth 3 (three) times daily as needed for dizziness. 30 tablet 0   metoprolol tartrate (LOPRESSOR) 100 MG tablet Take 1 tablet (100 mg total) by mouth once for 1 dose. Take 1 tablet two hours prior to CT scan.  1 tablet 0   mometasone-formoterol (DULERA) 200-5 MCG/ACT AERO Inhale 2 puffs into the lungs 2 (two) times daily. 13 g 1   omeprazole (PRILOSEC) 40 MG capsule Take 1 capsule (40 mg total) by mouth daily. 30 capsule 3   ondansetron (ZOFRAN ODT) 8 MG disintegrating tablet Take 1 tablet (8 mg total) by mouth every 8 (eight) hours as needed for nausea or vomiting. 20 tablet 0   ranitidine (ZANTAC) 150 MG capsule Take 1 capsule (150 mg total) by mouth 2 (two) times daily. 60 capsule 6   sacubitril-valsartan (ENTRESTO) 49-51 MG Take 1 tablet by mouth 2 (two) times daily. 60 tablet 1   spironolactone (ALDACTONE) 25 MG tablet Take 1 tablet (25 mg total) by mouth daily. 90 tablet 3   No current facility-administered medications on file prior to visit.    No Known Allergies   Assessment/Plan:  1. CHF -  Patient BP in room today 152/76 which is above goal of <130/80. Patient still very concerned about his kidneys with higher dose. He is agreeable to increasing his Entresto to twice a day. He will not be able to get Entresto in the DR, but he could have it over-nighted by [employer] for $174. We also discussed filling his Entresto right before he leaves for a 90 day supply. If he runs out and is not able to have his wife overnight his Entresto, than he could get valsartan in the DR.  He will purchase a new BP monitor so we have more accurate BP readings. He will check his blood pressure at home and bring the new monitor with him to his next apt in 2 weeks.  Continue  carvedilol 25mg   twice a day digoxin 0.125mg  daily,  furosemide 10mg  daily,  hydralazine 100mg  two -three times a day  isosorbide 60mg  daily spironolactone 25mg  daily Entresto 49/51mg  twice a day Amlodipine 10mg  daily Doxazosin 8mg  daily  Thank you,  Areya Lemmerman D Harvie Morua, Pharm.D, BCPS, CPP [place]  [address] [place], [place] [address]  Phone: [phone]; Fax: [phone]  01/24/2021 7:32 AM

## 2021-01-24 NOTE — Patient Instructions (Addendum)
Please start taking Entresto 49/51mg  TWICE a day  Please pick up a new blood pressure cuff  Please bring your new cuff to your next visit.  Continue checking blood pressure at home

## 2021-02-01 ENCOUNTER — Institutional Professional Consult (permissible substitution): Payer: Medicare Other | Admitting: Internal Medicine

## 2021-02-05 ENCOUNTER — Telehealth: Payer: Self-pay | Admitting: Medical

## 2021-02-05 NOTE — Telephone Encounter (Signed)
Left message for patient to call back and schedule Medicare Annual Wellness Visit (AWV) in office.   If not able to come in office, please offer to do virtually or by telephone.  Left office number and my jabber #[phone].  Due for AWVI  Please schedule at anytime with Nurse Health Advisor.   

## 2021-02-08 ENCOUNTER — Other Ambulatory Visit: Payer: Self-pay

## 2021-02-08 ENCOUNTER — Ambulatory Visit (INDEPENDENT_AMBULATORY_CARE_PROVIDER_SITE_OTHER): Payer: Medicare Other | Admitting: Pharmacist

## 2021-02-08 ENCOUNTER — Ambulatory Visit (INDEPENDENT_AMBULATORY_CARE_PROVIDER_SITE_OTHER): Payer: Medicare Other | Admitting: Family

## 2021-02-08 ENCOUNTER — Encounter: Payer: Self-pay | Admitting: Family

## 2021-02-08 VITALS — Ht 69.0 in | Wt 199.0 lb

## 2021-02-08 VITALS — BP 140/80 | HR 79 | Wt 198.0 lb

## 2021-02-08 DIAGNOSIS — I10 Essential (primary) hypertension: Secondary | ICD-10-CM | POA: Diagnosis not present

## 2021-02-08 DIAGNOSIS — I2510 Atherosclerotic heart disease of native coronary artery without angina pectoris: Secondary | ICD-10-CM

## 2021-02-08 DIAGNOSIS — L600 Ingrowing nail: Secondary | ICD-10-CM | POA: Diagnosis not present

## 2021-02-08 DIAGNOSIS — I420 Dilated cardiomyopathy: Secondary | ICD-10-CM | POA: Diagnosis not present

## 2021-02-08 MED ORDER — CEPHALEXIN 500 MG PO CAPS: 500.0000 mg | ORAL_CAPSULE | Freq: Two times a day (BID) | ORAL | 0 refills | Status: AC

## 2021-02-08 NOTE — Patient Instructions (Signed)
Continue: carvedilol 25mg  twice a day digoxin 0.125mg  daily,  furosemide 10mg  daily,  hydralazine 100mg  two -three times a day  isosorbide 60mg  daily spironolactone 25mg  daily Entresto 49/51mg  twice a day Amlodipine 10mg  daily Doxazosin 8mg  daily  Call me at [phone] with any questions

## 2021-02-08 NOTE — Progress Notes (Signed)
Matthew Nixon is a 60 y.o. male with the following history as recorded in EpicCare:  Patient Active Problem List   Diagnosis Date Noted   CAD (coronary artery disease), native coronary artery 12/14/2020   Mitral regurgitation 11/08/2019   Obesity 11/08/2019   CHF (congestive heart failure) ([place]) 11/08/2019   Chronic cough 03/11/2017   Dyspnea on exertion 03/11/2017   Personal history of bronchiectasis 03/11/2017   Abdominal pain, RUQ    Gastroesophageal reflux disease    Upper airway cough syndrome 11/28/2016   Bronchiectasis without complication ([place]) 13/24/4010   Chronic systolic heart failure ([place]) 08/07/2016   OSA on CPAP 08/07/2016   LBBB (left bundle branch block)    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 90 tablet 3   aspirin 81 MG tablet Take 81 mg by mouth daily.     carvedilol (COREG) 25 MG tablet Take 1 tablet (25 mg total) by mouth 2 (two) times daily with a meal. 180 tablet 3   cephALEXin (KEFLEX) 500 MG capsule Take 1 capsule (500 mg total) by mouth 2 (two) times daily for 5 days. 10 capsule 0   cetirizine (ZYRTEC) 10 MG tablet Take 10 mg by mouth daily.     digoxin (LANOXIN) 0.125 MG tablet Take 1 tablet (0.125 mg total) by mouth daily. 90 tablet 3   doxazosin (CARDURA) 8 MG tablet Take 1 tablet (8 mg total) by mouth daily. 90 tablet 3   fluticasone (FLONASE) 50 MCG/ACT nasal spray Place 2 sprays into both nostrils daily. 16 g 1   furosemide (LASIX) 20 MG tablet Take 0.5 tablets (10 mg total) by mouth daily. 90 tablet 3   hydrALAZINE (APRESOLINE) 100 MG tablet Take 1 tablet (100 mg total) by mouth 3 (three) times daily. 270 tablet 3   hydrOXYzine  (ATARAX/VISTARIL) 10 MG tablet 1-2 tab po q 8 hours as needed itching. 30 tablet 0   ipratropium (ATROVENT HFA) 17 MCG/ACT inhaler Inhale 2 puffs into the lungs every 6 (six) hours as needed for wheezing. 1 Inhaler 3   ISOSORBIDE MONONITRATE PO Take 60 mg by mouth daily.     meclizine (ANTIVERT) 12.5 MG tablet Take 1 tablet (12.5 mg total) by mouth 3 (three) times daily as needed for dizziness. 30 tablet 0   mometasone-formoterol (DULERA) 200-5 MCG/ACT AERO Inhale 2 puffs into the lungs 2 (two) times daily. 13 g 1   omeprazole (PRILOSEC) 40 MG capsule Take 1 capsule (40 mg total) by mouth daily. 30 capsule 3   ondansetron (ZOFRAN ODT) 8 MG disintegrating tablet Take 1 tablet (8 mg total) by mouth every 8 (eight) hours as needed for nausea or vomiting. 20 tablet 0   ranitidine (ZANTAC) 150 MG capsule Take 1 capsule (150 mg total) by mouth 2 (two) times daily. 60 capsule 6   sacubitril-valsartan (ENTRESTO) 49-51 MG Take 1 tablet by mouth 2 (two) times daily. 60 tablet 1   spironolactone (ALDACTONE) 25 MG tablet Take 1 tablet (25 mg total) by mouth daily. 90 tablet 3   albuterol (PROVENTIL HFA;VENTOLIN HFA) 108 (90 Base) MCG/ACT inhaler Inhale 2 puffs into the lungs every 6 (six) hours as needed for wheezing or shortness of breath. (Patient  not taking: Reported on 02/08/2021) 1 Inhaler 0   budesonide-formoterol (SYMBICORT) 80-4.5 MCG/ACT inhaler Inhale 2 puffs into the lungs 2 (two) times daily. (Patient not taking: Reported on 02/08/2021) 1 Inhaler 3   No current facility-administered medications for this visit.    Allergies: Patient has no known allergies.  Past Medical History:  Diagnosis Date   Allergy    CAD (coronary artery disease), native coronary artery    25-49% prox to mid RCA by coronary CTA 11/2020   Cardiomyopathy [facility])    DCM with EF 32% on echo 2014 and no ischemia on nuclear stress test.  EF improved to 40-45% by echo 02/2017   Chronic back pain    low back   Chronic systolic CHF  (congestive heart failure), NYHA class 2 ([place]) 08/07/2016   COPD (chronic obstructive pulmonary disease) ([place])    Dyspnea    with exertion   EKG, abnormal    history of left bundle block since 2002 per pt   GERD (gastroesophageal reflux disease)    Headache    Hypertension    LBBB (left bundle branch block)    OSA on CPAP 08/07/2016   Oxygen deficiency    Pain    right side pain for many years   PTSD (post-traumatic stress disorder)    Sleep apnea     Past Surgical History:  Procedure Laterality Date   ANKLE SURGERY Right    CARDIAC CATHETERIZATION  yrs ago   ESOPHAGOGASTRODUODENOSCOPY (EGD) WITH PROPOFOL N/A 01/13/2017   Procedure: ESOPHAGOGASTRODUODENOSCOPY (EGD) WITH PROPOFOL;  Surgeon: Irene Shipper, MD;  Location: [facility];  Service: Endoscopy;  Laterality: N/A;   HAND SURGERY     Right   HAND SURGERY     Left carpel tunnel   THUMB ARTHROSCOPY Left     Family History  Problem Relation Age of Onset   Heart disease Mother    Heart disease Father    Colon cancer Neg Hx    Esophageal cancer Neg Hx    Pancreatic cancer Neg Hx    Stomach cancer Neg Hx     Social History   Tobacco Use   Smoking status: Never   Smokeless tobacco: Never  Substance Use Topics   Alcohol use: No    Subjective:  Patient concerned about ingrown toenail ( right) x 5 months; would like to discuss getting removal;  Has been soaking in warm Epsom salt soaks with some relief;      Objective:  Vitals:   02/08/21 1511  Weight: 199 lb (90.3 kg)  Height: 5\' 9"  (1.753 m)    General: Well developed, well nourished, in no acute distress  Skin : Warm and dry.  Head: Normocephalic and atraumatic  Eyes: Sclera and conjunctiva clear; pupils round and reactive to light; extraocular movements intact  Ears: External normal; canals clear; tympanic membranes normal  Oropharynx: Pink, supple. No suspicious lesions  Neck: Supple without thyromegaly, adenopathy  Lungs: Respirations unlabored;   Neurologic: Alert and oriented; speech intact; face symmetrical; moves all extremities well; CNII-XII intact without focal deficit   Assessment:  1. Ingrown toenail     Plan:  Continue soaks; Rx for Keflex 500 mg bid x 7 days; refer to podiatry for removal.  This visit occurred during the SARS-CoV-2 public health emergency.  Safety protocols were in place, including screening questions prior to the visit, additional usage of staff PPE, and extensive cleaning of exam room while observing appropriate contact time as indicated for disinfecting solutions.  No follow-ups on file.  Orders Placed This Encounter  Procedures   Ambulatory referral to Podiatry    Referral Priority:   Routine    Referral Type:   Consultation    Referral Reason:   Specialty Services Required    Requested Specialty:   Podiatry    Number of Visits Requested:   1    Requested Prescriptions   Signed Prescriptions Disp Refills   cephALEXin (KEFLEX) 500 MG capsule 10 capsule 0    Sig: Take 1 capsule (500 mg total) by mouth 2 (two) times daily for 5 days.

## 2021-02-08 NOTE — Progress Notes (Signed)
Patient ID: Matthew Nixon                 DOB: 1961-04-05                      MRN: [id number]     HPI: Matthew Nixon is a 60 y.o. male referred by Dr. Radford Pax to pharmacy clinic for HF medication management. PMH is significant for chronic systolic heart failure NYHA class II, NICM with EF 32% (echo 2014) , improvement to 40-45% (echo 02/2017) and no ischemia on nuclear stress, 30 -35% (03/2019 echo), most recently 30-35% on 11/2020 echo with no changes from 03/2019 echo, coronary calcium score of 16 (56th percentile) with 25-49% RCA stenosis, COPD, HTN, left bundle branch block and OSA.   At first visit to PharmD clinic patient reported he did not pick up Entresto due to cost and renal concerns.  [name] PharmD was able to reduce patient copay and he was able to pick up.  Medication was then further titrated up to 49-51mg  BID. At last visit with [name], patient was hesitant to increase Entresto to 97/103, still concerned about his renal function and ability to get in the DR. Repeat BMP 1 week later showed improvement in scr (there was only a very minor increase initially anyway). At last visit, patient was only taking Entresto once a day. He agreed to increase to twice a day.  Patient presents today for CHF follow up.  Reports being a little SOB today. No swelling. Reports BP is "normal." Reports readings in the 170's but his home BP cuff is inaccurate (runs high). Does not have date yet to go to the [place] concerned about his kidney function. Doesn't like being on lot of medications. States when its time for him to go, its time. He is coughing more at night with taking Entresto twice a day.   SCr 1.09, K+ 4.4, BNP 93 in April 2022 11/30/20 Scr 1, K 4.3 01/10/21 Scr 0.98, K 4.5  Current CHF meds:  carvedilol 25mg  twice a day digoxin 0.125mg  daily,  furosemide 10mg  daily,  hydralazine 100mg  two -three times a day  isosorbide 60mg  daily spironolactone 25mg  daily Entresto  49/51mg  twice a day  HTN meds: amlodipine 10mg  daily, doxazosin 8mg  daily  BP goal: <130/80  Family History: The patient's family history includes Heart disease in his father and mother. There is no history of Colon cancer, Esophageal cancer, Pancreatic cancer, or Stomach cancer  Social History: + ETOH, never smoked  Diet: watches sodium, caffeine and sugar intake  Exercise: 100-500 push-up per day  Wt Readings from Last 3 Encounters:  01/24/21 198 lb (89.8 kg)  01/04/21 199 lb (90.3 kg)  01/03/21 197 lb (89.4 kg)   BP Readings from Last 3 Encounters:  01/24/21 (!) 152/76  01/04/21 (!) 150/70  01/03/21 (!) 156/82   Pulse Readings from Last 3 Encounters:  01/04/21 70  01/03/21 78  12/14/20 (!) 59    Renal function: CrCl cannot be calculated (Patient's most recent lab result is older than the maximum 21 days allowed.).  Past Medical History:  Diagnosis Date   Allergy    CAD (coronary artery disease), native coronary artery    25-49% prox to mid RCA by coronary CTA 11/2020   Cardiomyopathy [facility])    DCM with EF 32% on echo 2014 and no ischemia on nuclear stress test.  EF improved to 40-45% by echo 02/2017   Chronic back pain  low back   Chronic systolic CHF (congestive heart failure), NYHA class 2 ([place]) 08/07/2016   COPD (chronic obstructive pulmonary disease) ([facility])    Dyspnea    with exertion   EKG, abnormal    history of left bundle block since 2002 per pt   GERD (gastroesophageal reflux disease)    Headache    Hypertension    LBBB (left bundle branch block)    OSA on CPAP 08/07/2016   Oxygen deficiency    Pain    right side pain for many years   PTSD (post-traumatic stress disorder)    Sleep apnea     Current Outpatient Medications on File Prior to Visit  Medication Sig Dispense Refill   albuterol (PROVENTIL HFA;VENTOLIN HFA) 108 (90 Base) MCG/ACT inhaler Inhale 2 puffs into the lungs every 6 (six) hours as needed for wheezing or shortness of breath. 1  Inhaler 0   amLODipine (NORVASC) 10 MG tablet Take 1 tablet (10 mg total) by mouth daily. 90 tablet 3   aspirin 81 MG tablet Take 81 mg by mouth daily.     budesonide-formoterol (SYMBICORT) 80-4.5 MCG/ACT inhaler Inhale 2 puffs into the lungs 2 (two) times daily. 1 Inhaler 3   carvedilol (COREG) 25 MG tablet Take 1 tablet (25 mg total) by mouth 2 (two) times daily with a meal. 180 tablet 3   cetirizine (ZYRTEC) 10 MG tablet Take 10 mg by mouth daily.     digoxin (LANOXIN) 0.125 MG tablet Take 1 tablet (0.125 mg total) by mouth daily. 90 tablet 3   doxazosin (CARDURA) 8 MG tablet Take 1 tablet (8 mg total) by mouth daily. 90 tablet 3   fluticasone (FLONASE) 50 MCG/ACT nasal spray Place 2 sprays into both nostrils daily. 16 g 1   furosemide (LASIX) 20 MG tablet Take 0.5 tablets (10 mg total) by mouth daily. 90 tablet 3   hydrALAZINE (APRESOLINE) 100 MG tablet Take 1 tablet (100 mg total) by mouth 3 (three) times daily. 270 tablet 3   hydrOXYzine (ATARAX/VISTARIL) 10 MG tablet 1-2 tab po q 8 hours as needed itching. 30 tablet 0   ipratropium (ATROVENT HFA) 17 MCG/ACT inhaler Inhale 2 puffs into the lungs every 6 (six) hours as needed for wheezing. 1 Inhaler 3   ISOSORBIDE MONONITRATE PO Take 60 mg by mouth daily.     meclizine (ANTIVERT) 12.5 MG tablet Take 1 tablet (12.5 mg total) by mouth 3 (three) times daily as needed for dizziness. 30 tablet 0   mometasone-formoterol (DULERA) 200-5 MCG/ACT AERO Inhale 2 puffs into the lungs 2 (two) times daily. 13 g 1   omeprazole (PRILOSEC) 40 MG capsule Take 1 capsule (40 mg total) by mouth daily. 30 capsule 3   ondansetron (ZOFRAN ODT) 8 MG disintegrating tablet Take 1 tablet (8 mg total) by mouth every 8 (eight) hours as needed for nausea or vomiting. 20 tablet 0   ranitidine (ZANTAC) 150 MG capsule Take 1 capsule (150 mg total) by mouth 2 (two) times daily. 60 capsule 6   sacubitril-valsartan (ENTRESTO) 49-51 MG Take 1 tablet by mouth 2 (two) times daily.  60 tablet 1   spironolactone (ALDACTONE) 25 MG tablet Take 1 tablet (25 mg total) by mouth daily. 90 tablet 3   No current facility-administered medications on file prior to visit.    No Known Allergies   Assessment/Plan:  1. CHF - BP is above goal of <130/80 in clinic but is much improved. We discussed increasing Entresto to 97/103mg  BID.  However patient states that his cough is worse and he is concerned about his kidneys. We did discuss that he also has COPD and GERD, all of which can cause cough. I reminded him that high blood pressure can cause kidney damage and damage to his other organs. Patient does not want any medication changes. He states he will continue to take Entresto 49/51mg  BID. Patient will follow up if he changes his mind. He wants to check scr before he leaves for the DR. Advised he call me when he has a date and we will get him set up for labs.  Continue  carvedilol 25mg  twice a day digoxin 0.125mg  daily,  furosemide 10mg  daily,  hydralazine 100mg  two -three times a day  isosorbide 60mg  daily spironolactone 25mg  daily Entresto 49/51mg  twice a day Amlodipine 10mg  daily Doxazosin 8mg  daily  Thank you,  Crockett Rallo D Obrien Huskins, Pharm.D, BCPS, CPP [place]  [address], [place], [place] [address]  Phone: [phone]; Fax: [phone]  02/08/2021 7:59 AM

## 2021-02-18 DIAGNOSIS — L600 Ingrowing nail: Secondary | ICD-10-CM | POA: Diagnosis not present

## 2021-02-18 DIAGNOSIS — M79671 Pain in right foot: Secondary | ICD-10-CM | POA: Diagnosis not present

## 2021-02-18 DIAGNOSIS — B351 Tinea unguium: Secondary | ICD-10-CM | POA: Diagnosis not present

## 2021-03-09 ENCOUNTER — Other Ambulatory Visit: Payer: Self-pay | Admitting: Cardiology

## 2021-03-11 ENCOUNTER — Other Ambulatory Visit: Payer: Self-pay | Admitting: *Deleted

## 2021-03-11 MED ORDER — ENTRESTO 49-51 MG PO TABS
1.0000 | ORAL_TABLET | Freq: Two times a day (BID) | ORAL | 11 refills | Status: DC
Start: 2021-03-11 — End: 2022-04-09

## 2021-03-18 ENCOUNTER — Ambulatory Visit (INDEPENDENT_AMBULATORY_CARE_PROVIDER_SITE_OTHER): Payer: Medicare Other | Admitting: Internal Medicine

## 2021-03-18 ENCOUNTER — Encounter: Payer: Self-pay | Admitting: Internal Medicine

## 2021-03-18 VITALS — BP 158/70 | HR 96 | Ht 67.5 in | Wt 198.2 lb

## 2021-03-18 DIAGNOSIS — I2510 Atherosclerotic heart disease of native coronary artery without angina pectoris: Secondary | ICD-10-CM

## 2021-03-18 DIAGNOSIS — R140 Abdominal distension (gaseous): Secondary | ICD-10-CM

## 2021-03-18 DIAGNOSIS — K219 Gastro-esophageal reflux disease without esophagitis: Secondary | ICD-10-CM

## 2021-03-18 MED ORDER — METRONIDAZOLE 250 MG PO TABS: 250.0000 mg | ORAL_TABLET | Freq: Three times a day (TID) | ORAL | 0 refills | Status: DC

## 2021-03-18 MED ORDER — OMEPRAZOLE 40 MG PO CPDR: 40.0000 mg | DELAYED_RELEASE_CAPSULE | Freq: Every day | ORAL | 3 refills | Status: AC

## 2021-03-18 NOTE — Progress Notes (Signed)
HISTORY OF PRESENT ILLNESS:  Matthew Nixon is a 60 y.o. 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  Also history of GERD and recurrent nausea with vomiting/chills.  Patient was last seen in this office March 30, 2018.  At that time chronic GERD symptoms were controlled with his acid reflux regimen and weight reduction.  His chronic right upper quadrant pain was felt to be musculoskeletal.  He had negative Cologuard testing July 2018.  Patient presents today for follow-up.  He tells me that he had been living in the [place]  In December 2021 he developed an acute illness consisting of nausea with vomiting, diarrhea and abdominal discomfort.  There was associated presyncopal symptoms.  He recovered.  Current complaints include intermittent GERD.  He is no longer on PPI therapy.  He does request medication refill.  He also describes postprandial bloating and increased gas as manifested by flatus.  Increased bowel sounds consistent with borborygmi.  Intermittent abdominal discomfort, which is not new.  Weight has been stable.  No bleeding.  Patient tells me that he did undergo repeat Cologuard testing which was negative.  I do not see this in the chart, but he is certain.  He is on no particular diet.  No dysphagia.  Review of laboratories from July 2022 shows normal CBC with hemoglobin 16.4.  Abdominal ultrasound July 2021 shows fatty liver.  Last upper endoscopy July 2018 was normal  REVIEW OF SYSTEMS:  All non-GI ROS negative unless otherwise stated in the HPI except for sleeping problems, sinus and allergy, cough  Past Medical History:  Diagnosis Date   Allergy    CAD (coronary artery disease), native coronary artery    25-49% prox to mid RCA by coronary CTA 11/2020   Cardiomyopathy ([place])    DCM with EF 32% on echo 2014 and no ischemia on nuclear stress test.  EF improved to  40-45% by echo 02/2017   Chronic back pain    low back   Chronic systolic CHF (congestive heart failure), NYHA class 2 ([place]) 08/07/2016   COPD (chronic obstructive pulmonary disease) ([place])    Dyspnea    with exertion   EKG, abnormal    history of left bundle block since 2002 per pt   GERD (gastroesophageal reflux disease)    Headache    Hypertension    LBBB (left bundle branch block)    OSA on CPAP 08/07/2016   Oxygen deficiency    Pain    right side pain for many years   PTSD (post-traumatic stress disorder)    Sleep apnea     Past Surgical History:  Procedure Laterality Date   ANKLE SURGERY Right    CARDIAC CATHETERIZATION  yrs ago   ESOPHAGOGASTRODUODENOSCOPY (EGD) WITH PROPOFOL N/A 01/13/2017   Procedure: ESOPHAGOGASTRODUODENOSCOPY (EGD) WITH PROPOFOL;  Surgeon: Irene Shipper, MD;  Location: [facility];  Service: Endoscopy;  Laterality: N/A;   HAND SURGERY     Right   HAND SURGERY     Left carpel tunnel   THUMB ARTHROSCOPY Left     Social History Matthew Nixon  reports that he has never smoked. He has never used smokeless tobacco. He reports that he does not drink alcohol and does not use drugs.  family history includes Heart disease in his father and mother.  No Known Allergies     PHYSICAL EXAMINATION: Vital signs: BP (!) 158/70 (BP  Location: Left Arm, Patient Position: Sitting, Cuff Size: Normal)   Pulse 96   Ht 5' 7.5" (1.715 m) Comment: height measured without shoes  Wt 198 lb 4 oz (89.9 kg)   BMI 30.59 kg/m   Constitutional: generally well-appearing, no acute distress Psychiatric: alert and oriented x3, cooperative Eyes: extraocular movements intact, anicteric, conjunctiva pink Mouth: oral pharynx moist, no lesions Neck: supple no lymphadenopathy Cardiovascular: heart regular rate and rhythm, no murmur Lungs: clear to auscultation bilaterally Abdomen: soft, nontender, nondistended, no obvious ascites, no peritoneal signs, normal bowel sounds, no  organomegaly Rectal: Omitted Extremities: no clubbing, cyanosis, or lower extremity edema bilaterally Skin: no lesions on visible extremities Neuro: No focal deficits. No asterixis.     ASSESSMENT:  1.  GERD.  Intermittent active symptoms.  Currently not on acid suppressive therapy. 2.  Abdominal bloating with increased flatus and borborygmi.  Rule out bacterial overgrowth 3.  Chronic right upper quadrant pain with extensive negative work-up.  Felt to be musculoskeletal 4.  Colon cancer screening strategy pick by the patient is Cologuard via his PCP   PLAN:  1.  Reflux precautions 2.  Anti-gas and flatulence dietary sheet 3.  Prescribe metronidazole 250 mg 3 times daily for 10 days.  No refills.  Do not consume alcohol while taking this medication.  Advised. 4.  Prescribe omeprazole 40 mg daily.  Medication risks reviewed 5.  Routine GI follow-up 1 year.  Sooner if needed

## 2021-03-18 NOTE — Patient Instructions (Signed)
If you are age 61 or older, your body mass index should be between 23-30. Your Body mass index is 30.59 kg/m. If this is out of the aforementioned range listed, please consider follow up with your Primary Care Provider.  If you are age 44 or younger, your body mass index should be between 19-25. Your Body mass index is 30.59 kg/m. If this is out of the aformentioned range listed, please consider follow up with your Primary Care Provider.   __________________________________________________________  The [place] [facility] providers would like to encourage you to use [facility] to communicate with providers for non-urgent requests or questions.  Due to long hold times on the telephone, sending your provider a message by [facility] may be a faster and more efficient way to get a response.  Please allow 48 business hours for a response.  Please remember that this is for non-urgent requests.   We have sent the following medications to your pharmacy for you to pick up at your convenience:  Flagyl, Omeprazole  Please follow up in one year

## 2021-04-11 ENCOUNTER — Telehealth: Payer: Self-pay | Admitting: Medical

## 2021-04-11 NOTE — Telephone Encounter (Signed)
Dentist have form for me to fill out giving clearance. 3 month since last seen by me.  Can you get him scheduled for office visit early next week.   Want to check his bp and do exam before filling out form giving clearance.

## 2021-04-12 ENCOUNTER — Other Ambulatory Visit: Payer: Self-pay

## 2021-04-12 ENCOUNTER — Ambulatory Visit (INDEPENDENT_AMBULATORY_CARE_PROVIDER_SITE_OTHER): Payer: Medicare Other | Admitting: Medical

## 2021-04-12 ENCOUNTER — Encounter: Payer: Self-pay | Admitting: Medical

## 2021-04-12 VITALS — BP 140/88 | HR 85 | Temp 98.2°F | Resp 18 | Ht 67.0 in | Wt 203.0 lb

## 2021-04-12 DIAGNOSIS — B351 Tinea unguium: Secondary | ICD-10-CM | POA: Diagnosis not present

## 2021-04-12 DIAGNOSIS — I10 Essential (primary) hypertension: Secondary | ICD-10-CM | POA: Diagnosis not present

## 2021-04-12 DIAGNOSIS — J4520 Mild intermittent asthma, uncomplicated: Secondary | ICD-10-CM | POA: Diagnosis not present

## 2021-04-12 DIAGNOSIS — I5022 Chronic systolic (congestive) heart failure: Secondary | ICD-10-CM

## 2021-04-12 DIAGNOSIS — R739 Hyperglycemia, unspecified: Secondary | ICD-10-CM

## 2021-04-12 DIAGNOSIS — I2510 Atherosclerotic heart disease of native coronary artery without angina pectoris: Secondary | ICD-10-CM

## 2021-04-12 MED ORDER — VALSARTAN 40 MG PO TABS: 40.0000 mg | ORAL_TABLET | Freq: Every day | ORAL | 1 refills | Status: DC

## 2021-04-12 MED ORDER — ALBUTEROL SULFATE HFA 108 (90 BASE) MCG/ACT IN AERS: 2.0000 | INHALATION_SPRAY | Freq: Four times a day (QID) | RESPIRATORY_TRACT | 0 refills | Status: DC | PRN

## 2021-04-12 MED ORDER — MOMETASONE FURO-FORMOTEROL FUM 200-5 MCG/ACT IN AERO: 2.0000 | INHALATION_SPRAY | Freq: Two times a day (BID) | RESPIRATORY_TRACT | 1 refills | Status: DC

## 2021-04-12 NOTE — Patient Instructions (Signed)
History of CHF.  Clinically stable presently.  Blood pressure better when I rechecked.  Continue Entresto presently.  I want to review your metabolic panel and check your kidney function.  That I want to coordinate with your cardiologist as to what dose valsartan he would want you on I provided you do not have Entresto available in the [place]  Also we will get BNP.  Continue digoxin.   History of elevated blood sugar and will get A1c today.  On review of cardiologist note they recommended potentially starting Farxiga.  Might consider other medication if [place] not available in the [place]  Blood pressure better on recheck.  Continue spironolactone, carvedilol, hydralazine and isosorbide.  History of onychomycosis.  We will follow your liver enzymes.  Then make decision whether to prescribe Lamisil for additional 1 month.  History of reactive airway disease.  Flares when exposed to dust.  Refilled both albuterol inhaler and Dulera.  Use as needed.  Follow-up in 6 months or sooner if needed.

## 2021-04-12 NOTE — Progress Notes (Signed)
Subjective:    Patient ID: Matthew Nixon, male    DOB: 1961-04-25, 60 y.o.   MRN: [id number]  HPI   Pt in for follow up.  Pt had rt great toe partial ingrown medial portion and was given lamisil.   He wants liver enzymes checked.   Elevated sugar in the past.   Pt updates me he got dental cleaning just recently. Pt declined to get deeper cleaning. I have form to fill out for this procedure. So not filling out form.   Hx of chf. 01/03/2021 note.  Patient BP in room today 156/82 which is above goal of <130/80 and lower than his home cuff readings.  Discussed importance of Entresto in treating CHF, however patient remains concerned about renal function and lack of availability in [place].  Advised that we could change from Entresto to valsartan when patient knows when he will move as that may be more easily accessible to him.  Recommended increasing Entresto to max dose 97-103 but patient is hesitant.  Compromised and decided we can recheck BMP in 1 week after patient has been on Entresto 49-51 for a longer period of time and if renal function stable, then discuss increasing to 97-103.   Recommended starting Farxiga but patient does not want to at this time due to medication burden and his expectation it will not be avilable in [place]>   Will continue current HF regimen at this time:   Continue spironolactone 25mg  daily Continue carvedilol 25mg  BID,  Continue Digoxin 0.125mg  daily,  Continue hydralazine three times a day (advised that if midday dose gives him headaches he can take 1/2 tablet) Continue isosorbide 60mg  daily. Continue monitoring weight and BP at home Check BMP in 1 week   Pt is about to leave for [employer]. He may leave for winter and be back in April.     Review of Systems  Constitutional:  Negative for chills, fatigue and fever.  HENT:  Negative for congestion.   Respiratory:  Negative for cough, chest tightness, shortness of breath and wheezing.    Cardiovascular:  Negative for chest pain and palpitations.  Gastrointestinal:  Negative for abdominal distention, anal bleeding and blood in stool.  Musculoskeletal:  Negative for back pain and gait problem.  Skin:  Negative for rash.  Neurological:  Negative for dizziness, facial asymmetry, speech difficulty and headaches.  Psychiatric/Behavioral:  Negative for behavioral problems, confusion and sleep disturbance. The patient is not nervous/anxious.     Past Medical History:  Diagnosis Date   Allergy    CAD (coronary artery disease), native coronary artery    25-49% prox to mid RCA by coronary CTA 11/2020   Cardiomyopathy [facility])    DCM with EF 32% on echo 2014 and no ischemia on nuclear stress test.  EF improved to 40-45% by echo 02/2017   Chronic back pain    low back   Chronic systolic CHF (congestive heart failure), NYHA class 2 ([place]) 08/07/2016   COPD (chronic obstructive pulmonary disease) ([place])    Dyspnea    with exertion   EKG, abnormal    history of left bundle block since 2002 per pt   GERD (gastroesophageal reflux disease)    Headache    Hypertension    LBBB (left bundle branch block)    OSA on CPAP 08/07/2016   Oxygen deficiency    Pain    right side pain for many years   PTSD (post-traumatic stress disorder)    Sleep apnea  Social History   Socioeconomic History   Marital status: Married    Spouse name: Not on file   Number of children: Not on file   Years of education: Not on file   Highest education level: Not on file  Occupational History   Not on file  Tobacco Use   Smoking status: Never   Smokeless tobacco: Never  Vaping Use   Vaping Use: Never used  Substance and Sexual Activity   Alcohol use: No   Drug use: No   Sexual activity: Not on file  Other Topics Concern   Not on file  Social History Narrative   2 years college    Social Determinants of Health   Financial Resource Strain: Not on file  Food Insecurity: Not on file   Transportation Needs: Not on file  Physical Activity: Not on file  Stress: Not on file  Social Connections: Not on file  Intimate Partner Violence: Not on file    Past Surgical History:  Procedure Laterality Date   ANKLE SURGERY Right    CARDIAC CATHETERIZATION  yrs ago   ESOPHAGOGASTRODUODENOSCOPY (EGD) WITH PROPOFOL N/A 01/13/2017   Procedure: ESOPHAGOGASTRODUODENOSCOPY (EGD) WITH PROPOFOL;  Surgeon: Irene Shipper, MD;  Location: [facility];  Service: Endoscopy;  Laterality: N/A;   HAND SURGERY     Right   HAND SURGERY     Left carpel tunnel   THUMB ARTHROSCOPY Left     Family History  Problem Relation Age of Onset   Heart disease Mother    Heart disease Father    Colon cancer Neg Hx    Esophageal cancer Neg Hx    Pancreatic cancer Neg Hx    Stomach cancer Neg Hx     No Known Allergies  Current Outpatient Medications on File Prior to Visit  Medication Sig Dispense Refill   albuterol (PROVENTIL HFA;VENTOLIN HFA) 108 (90 Base) MCG/ACT inhaler Inhale 2 puffs into the lungs every 6 (six) hours as needed for wheezing or shortness of breath. 1 Inhaler 0   amLODipine (NORVASC) 10 MG tablet Take 1 tablet (10 mg total) by mouth daily. 90 tablet 3   aspirin 81 MG tablet Take 81 mg by mouth daily.     budesonide-formoterol (SYMBICORT) 80-4.5 MCG/ACT inhaler Inhale 2 puffs into the lungs 2 (two) times daily. 1 Inhaler 3   carvedilol (COREG) 25 MG tablet Take 1 tablet (25 mg total) by mouth 2 (two) times daily with a meal. 180 tablet 3   digoxin (LANOXIN) 0.125 MG tablet Take 1 tablet (0.125 mg total) by mouth daily. 90 tablet 3   doxazosin (CARDURA) 8 MG tablet Take 1 tablet (8 mg total) by mouth daily. 90 tablet 3   fluticasone (FLONASE) 50 MCG/ACT nasal spray Place 2 sprays into both nostrils daily. 16 g 1   furosemide (LASIX) 20 MG tablet Take 0.5 tablets (10 mg total) by mouth daily. 90 tablet 3   hydrALAZINE (APRESOLINE) 100 MG tablet Take 1 tablet (100 mg total) by mouth 3  (three) times daily. (Patient taking differently: Take 100 mg by mouth 2 (two) times daily.) 270 tablet 3   hydrOXYzine (ATARAX/VISTARIL) 10 MG tablet 1-2 tab po q 8 hours as needed itching. 30 tablet 0   ipratropium (ATROVENT HFA) 17 MCG/ACT inhaler Inhale 2 puffs into the lungs every 6 (six) hours as needed for wheezing. 1 Inhaler 3   ISOSORBIDE MONONITRATE PO Take 60 mg by mouth daily.     meclizine (ANTIVERT) 12.5 MG tablet  Take 1 tablet (12.5 mg total) by mouth 3 (three) times daily as needed for dizziness. 30 tablet 0   metroNIDAZOLE (FLAGYL) 250 MG tablet Take 1 tablet (250 mg total) by mouth 3 (three) times daily. 30 tablet 0   mometasone-formoterol (DULERA) 200-5 MCG/ACT AERO Inhale 2 puffs into the lungs 2 (two) times daily. 13 g 1   omeprazole (PRILOSEC) 40 MG capsule Take 1 capsule (40 mg total) by mouth daily. 90 capsule 3   ranitidine (ZANTAC) 150 MG capsule Take 1 capsule (150 mg total) by mouth 2 (two) times daily. (Patient taking differently: Take 150 mg by mouth as needed.) 60 capsule 6   sacubitril-valsartan (ENTRESTO) 49-51 MG Take 1 tablet by mouth 2 (two) times daily. 60 tablet 11   spironolactone (ALDACTONE) 25 MG tablet Take 1 tablet (25 mg total) by mouth daily. 90 tablet 3   No current facility-administered medications on file prior to visit.    BP (!) 165/85   Pulse 85   Temp 98.2 F (36.8 C)   Resp 18   Ht 5\' 7"  (1.702 m)   Wt 203 lb (92.1 kg)   SpO2 98%   BMI 31.79 kg/m   140/88 on recheck espac. Seated with feet on floor.    Objective:   Physical Exam   General Mental Status- Alert. General Appearance- Not in acute distress.   Skin General: Color- Normal Color. Moisture- Normal Moisture.  Neck Carotid Arteries- Normal color. Moisture- Normal Moisture. No carotid bruits. No JVD.  Chest and Lung Exam Auscultation: Breath Sounds:-Normal.  Cardiovascular Auscultation:Rythm- Regular. Murmurs & Other Heart Sounds:Auscultation of the heart reveals-  No Murmurs.  Abdomen Inspection:-Inspeection Normal. Palpation/Percussion:Note:No mass. Palpation and Percussion of the abdomen reveal- Non Tender, Non Distended + BS, no rebound or guarding.   Neurologic Cranial Nerve exam:- CN III-XII intact(No nystagmus), symmetric smile. Strength:- 5/5 equal and symmetric strength both upper and lower extremities.    Lower ext- no pedal edema. Symmertric calves.  Rt great toe- disfigured nail with yellow discoloration.      Assessment & Plan:   Patient Instructions  History of CHF.  Clinically stable presently.  Blood pressure better when I rechecked.  Continue Entresto presently.  I want to review your metabolic panel and check your kidney function.  That I want to coordinate with your cardiologist as to what dose valsartan he would want you on I provided you do not have Entresto available in the [place]  Also we will get BNP.  Continue digoxin.   History of elevated blood sugar and will get A1c today.  On review of cardiologist note they recommended potentially starting Farxiga.  Might consider other medication if [place] not available in the [place]  Blood pressure better on recheck.  Continue spironolactone, carvedilol, hydralazine and isosorbide.  History of onychomycosis.  We will follow your liver enzymes.  Then make decision whether to prescribe Lamisil for additional 1 month.  History of reactive airway disease.  Flares when exposed to dust.  Refilled both albuterol inhaler and Dulera.  Use as needed.  Follow-up in 6 months or sooner if needed.   Mackie Pai, PA-C   Time spent with patient today was  41 minutes which consisted of chart revdiew, discussing diagnosis, work up treatment and documentation.  Extra time spent on how would handle him potentially running out of entresto when he travels to [place]

## 2021-04-12 NOTE — Telephone Encounter (Signed)
Pt has an appt 04/12/21

## 2021-04-13 ENCOUNTER — Other Ambulatory Visit: Payer: Self-pay | Admitting: Medical

## 2021-04-13 LAB — COMPREHENSIVE METABOLIC PANEL
AG Ratio: 1.6 (calc) (ref 1.0–2.5)
ALT: 65 U/L — ABNORMAL HIGH (ref 9–46)
AST: 35 U/L (ref 10–35)
Albumin: 4.3 g/dL (ref 3.6–5.1)
Alkaline phosphatase (APISO): 65 U/L (ref 35–144)
BUN: 14 mg/dL (ref 7–25)
CO2: 26 mmol/L (ref 20–32)
Calcium: 9.8 mg/dL (ref 8.6–10.3)
Chloride: 105 mmol/L (ref 98–110)
Creat: 1.06 mg/dL (ref 0.70–1.35)
Globulin: 2.7 g/dL (calc) (ref 1.9–3.7)
Glucose, Bld: 78 mg/dL (ref 65–99)
Potassium: 4.4 mmol/L (ref 3.5–5.3)
Sodium: 141 mmol/L (ref 135–146)
Total Bilirubin: 0.5 mg/dL (ref 0.2–1.2)
Total Protein: 7 g/dL (ref 6.1–8.1)

## 2021-04-13 LAB — HEMOGLOBIN A1C
Hgb A1c MFr Bld: 5.4 % of total Hgb (ref <5.7)
Mean Plasma Glucose: 108 mg/dL
eAG (mmol/L): 6 mmol/L

## 2021-04-13 LAB — BRAIN NATRIURETIC PEPTIDE: Brain Natriuretic Peptide: 78 pg/mL (ref <100)

## 2021-04-15 MED ORDER — FLUTICASONE PROPIONATE HFA 220 MCG/ACT IN AERO: 2.0000 | INHALATION_SPRAY | Freq: Two times a day (BID) | RESPIRATORY_TRACT | 12 refills | Status: DC

## 2021-04-15 MED ORDER — BUDESONIDE-FORMOTEROL FUMARATE 160-4.5 MCG/ACT IN AERO: 2.0000 | INHALATION_SPRAY | Freq: Two times a day (BID) | RESPIRATORY_TRACT | 12 refills | Status: DC

## 2021-04-15 NOTE — Addendum Note (Signed)
Addended by: Anabel Halon on: 04/15/2021 09:04 PM   Modules accepted: Orders

## 2021-04-15 NOTE — Telephone Encounter (Signed)
Pharmacy comment: drug not covered by insurance, step therapy. covered - ADVAAIR DISKUS/HFA, SYMIBCORT, BREO ELLIPTA, fluticasone.

## 2021-04-15 NOTE — Addendum Note (Signed)
Addended by: Anabel Halon on: 04/15/2021 09:06 PM   Modules accepted: Orders

## 2021-04-18 ENCOUNTER — Telehealth: Payer: Self-pay

## 2021-04-18 NOTE — Telephone Encounter (Signed)
Pt had previously brought into office a notice from [employer] regarding a no show fee for DOS 11/14/19.  I have Building surveyor and asked them to remove the N/S fee and remove pull the debt back from [employer].  I have called pt and he is aware of this.

## 2021-07-25 ENCOUNTER — Telehealth: Payer: Self-pay | Admitting: Medical

## 2021-07-25 NOTE — Telephone Encounter (Signed)
Left message for patient to call back and schedule Medicare Annual Wellness Visit (AWV) in office.  ° °If not able to come in office, please offer to do virtually or by telephone.  Left office number and my jabber #[phone]. ° °Due for AWVI ° °Please schedule at anytime with Nurse Health Advisor. °  °

## 2021-08-29 ENCOUNTER — Telehealth: Payer: Self-pay | Admitting: Medical

## 2021-08-29 NOTE — Telephone Encounter (Signed)
Left message for patient to call back and schedule Medicare Annual Wellness Visit (AWV) in office.  ? ?If not able to come in office, please offer to do virtually or by telephone.  Left office number and my jabber [phone]. ? ?Last AWV:11/22/2014 ? ?Please schedule at anytime with Nurse Health Advisor. ?  ?

## 2021-09-24 ENCOUNTER — Ambulatory Visit (HOSPITAL_BASED_OUTPATIENT_CLINIC_OR_DEPARTMENT_OTHER)
Admission: RE | Admit: 2021-09-24 | Discharge: 2021-09-24 | Disposition: A | Discharge status: Home / Self Care | Payer: Medicare Other | Source: Ambulatory Visit | Attending: Medical | Admitting: Medical

## 2021-09-24 ENCOUNTER — Encounter: Payer: Self-pay | Admitting: Medical

## 2021-09-24 ENCOUNTER — Ambulatory Visit (INDEPENDENT_AMBULATORY_CARE_PROVIDER_SITE_OTHER): Payer: Medicare Other | Admitting: Medical

## 2021-09-24 VITALS — BP 154/80 | HR 69 | Resp 18 | Ht 67.0 in | Wt 200.8 lb

## 2021-09-24 DIAGNOSIS — R739 Hyperglycemia, unspecified: Secondary | ICD-10-CM

## 2021-09-24 DIAGNOSIS — M79645 Pain in left finger(s): Secondary | ICD-10-CM

## 2021-09-24 DIAGNOSIS — M79672 Pain in left foot: Secondary | ICD-10-CM

## 2021-09-24 DIAGNOSIS — M7989 Other specified soft tissue disorders: Secondary | ICD-10-CM | POA: Diagnosis not present

## 2021-09-24 DIAGNOSIS — M7732 Calcaneal spur, left foot: Secondary | ICD-10-CM | POA: Diagnosis not present

## 2021-09-24 DIAGNOSIS — I10 Essential (primary) hypertension: Secondary | ICD-10-CM | POA: Diagnosis not present

## 2021-09-24 DIAGNOSIS — I5022 Chronic systolic (congestive) heart failure: Secondary | ICD-10-CM

## 2021-09-24 DIAGNOSIS — M2550 Pain in unspecified joint: Secondary | ICD-10-CM

## 2021-09-24 LAB — COMPREHENSIVE METABOLIC PANEL
ALT: 56 U/L — ABNORMAL HIGH (ref 0–53)
AST: 28 U/L (ref 0–37)
Albumin: 4.6 g/dL (ref 3.5–5.2)
Alkaline Phosphatase: 60 U/L (ref 39–117)
BUN: 15 mg/dL (ref 6–23)
CO2: 31 mEq/L (ref 19–32)
Calcium: 10.3 mg/dL (ref 8.4–10.5)
Chloride: 104 mEq/L (ref 96–112)
Creatinine, Ser: 1.08 mg/dL (ref 0.40–1.50)
GFR: 73.92 mL/min (ref >60.00)
Glucose, Bld: 95 mg/dL (ref 70–99)
Potassium: 5 mEq/L (ref 3.5–5.1)
Sodium: 141 mEq/L (ref 135–145)
Total Bilirubin: 0.5 mg/dL (ref 0.2–1.2)
Total Protein: 7.1 g/dL (ref 6.0–8.3)

## 2021-09-24 LAB — URIC ACID: Uric Acid, Serum: 6.7 mg/dL (ref 4.0–7.8)

## 2021-09-24 LAB — BRAIN NATRIURETIC PEPTIDE: Pro B Natriuretic peptide (BNP): 64 pg/mL (ref 0.0–100.0)

## 2021-09-24 LAB — HEMOGLOBIN A1C: Hgb A1c MFr Bld: 5.7 % (ref 4.6–6.5)

## 2021-09-24 MED ORDER — CEPHALEXIN 500 MG PO CAPS: 500.0000 mg | ORAL_CAPSULE | Freq: Two times a day (BID) | ORAL | 0 refills | Status: DC

## 2021-09-24 MED ORDER — OLOPATADINE HCL 0.1 % OP SOLN: 1.0000 [drp] | Freq: Two times a day (BID) | OPHTHALMIC | 12 refills | Status: DC

## 2021-09-24 MED ORDER — FLUTICASONE PROPIONATE 50 MCG/ACT NA SUSP: 2.0000 | Freq: Every day | NASAL | 1 refills | Status: DC

## 2021-09-24 NOTE — Patient Instructions (Addendum)
Left index finger and left great toe/foot area pain for 3 to 4 months.  We will get x-rays of both areas and a uric acid.  Tenderness you describe over the past month may indicate gout.  Treatment to be determined after imaging and lab results. ? ?Hypertension with history of CHF-blood pressure not ideally controlled.  We will get BNP today and metabolic panel.  Continue spironolactone 25mg  daily ?carvedilol 25mg  BID,  ? Digoxin 0.125mg  daily,  ?hydralazine three times a day (advised that if midday dose gives him headaches he can take 1/2 tablet) ?isosorbide 60mg  daily. ?Pt still on entresto 49-51. ? ? ? ?Elevated blood sugar-get A1c. ? ?For allergic rhinitis-continue Zyrtec.  Adding Flonase nasal spray and Patanol eyedrops. ? ?Some sinus pressure as well with some intermittent colored mucus when you blow your nose.  If with above allergy treatment sinus pressure does not resolve then start Keflex antibiotic. ? ?Follow-up date to be determined after lab review and imaging review. ? ?Please go ahead and call cardiologist office/get scheduled for follow-up. ? ?

## 2021-09-24 NOTE — Progress Notes (Signed)
? ?Subjective:  ? ? Patient ID: Matthew Nixon, male    DOB: 1961/06/07, 61 y.o.   MRN: [id number] ? ?HPI ?Pt in with left 2nd digit dip area pain and left big toe pain. He states he felt pain when dancing in [place]. No fall or trauma.  ? ?Pt was given topical anti- inflammatory and pain decreased but still having some pain. ? ? ?Htn and chf. bp is elevated today iniitially. ? ?spironolactone 25mg  daily ?carvedilol 25mg  BID,  ? Digoxin 0.125mg  daily,  ?hydralazine three times a day (advised that if midday dose gives him headaches he can take 1/2 tablet) ?isosorbide 60mg  daily. ?Pt still on entresto 49-51. ? ?Allergies signs and symptoms for 2 weeks.- zyrtec otc. Sneezing and coughing. Eyes are itching. ? ? ? ?Review of Systems  ?Constitutional:  Negative for chills and fever.  ?Respiratory:  Negative for cough, chest tightness, shortness of breath and wheezing.   ?Cardiovascular:  Negative for chest pain and palpitations.  ?Gastrointestinal:  Negative for abdominal pain, diarrhea, rectal pain and vomiting.  ?Genitourinary:  Negative for dysuria, flank pain and frequency.  ?Musculoskeletal:  Negative for back pain.  ?     Left 2nd digit dip area pain and left great toe/foot pain.  ?Skin:  Negative for rash.  ? ?Past Medical History:  ?Diagnosis Date  ? Allergy   ? CAD (coronary artery disease), native coronary artery   ? 25-49% prox to mid RCA by coronary CTA 11/2020  ? Cardiomyopathy ([place])   ? DCM with EF 32% on echo 2014 and no ischemia on nuclear stress test.  EF improved to 40-45% by echo 02/2017  ? Chronic back pain   ? low back  ? Chronic systolic CHF (congestive heart failure), NYHA class 2 ([place]) 08/07/2016  ? COPD (chronic obstructive pulmonary disease) ([place])   ? Dyspnea   ? with exertion  ? EKG, abnormal   ? history of left bundle block since 2002 per pt  ? GERD (gastroesophageal reflux disease)   ? Headache   ? Hypertension   ? LBBB (left bundle branch block)   ? OSA on CPAP 08/07/2016  ? Oxygen  deficiency   ? Pain   ? right side pain for many years  ? PTSD (post-traumatic stress disorder)   ? Sleep apnea   ? ?  ?Social History  ? ?Socioeconomic History  ? Marital status: Married  ?  Spouse name: Not on file  ? Number of children: Not on file  ? Years of education: Not on file  ? Highest education level: Not on file  ?Occupational History  ? Not on file  ?Tobacco Use  ? Smoking status: Never  ? Smokeless tobacco: Never  ?Vaping Use  ? Vaping Use: Never used  ?Substance and Sexual Activity  ? Alcohol use: No  ? Drug use: No  ? Sexual activity: Not on file  ?Other Topics Concern  ? Not on file  ?Social History Narrative  ? 2 years college   ? ?Social Determinants of Health  ? ?Financial Resource Strain: Not on file  ?Food Insecurity: Not on file  ?Transportation Needs: Not on file  ?Physical Activity: Not on file  ?Stress: Not on file  ?Social Connections: Not on file  ?Intimate Partner Violence: Not on file  ? ? ?Past Surgical History:  ?Procedure Laterality Date  ? ANKLE SURGERY Right   ? CARDIAC CATHETERIZATION  yrs ago  ? ESOPHAGOGASTRODUODENOSCOPY (EGD) WITH PROPOFOL N/A 01/13/2017  ? Procedure:  ESOPHAGOGASTRODUODENOSCOPY (EGD) WITH PROPOFOL;  Surgeon: Irene Shipper, MD;  Location: [facility];  Service: Endoscopy;  Laterality: N/A;  ? HAND SURGERY    ? Right  ? HAND SURGERY    ? Left carpel tunnel  ? THUMB ARTHROSCOPY Left   ? ? ?Family History  ?Problem Relation Age of Onset  ? Heart disease Mother   ? Heart disease Father   ? Colon cancer Neg Hx   ? Esophageal cancer Neg Hx   ? Pancreatic cancer Neg Hx   ? Stomach cancer Neg Hx   ? ? ?No Known Allergies ? ?Current Outpatient Medications on File Prior to Visit  ?Medication Sig Dispense Refill  ? albuterol (PROVENTIL HFA;VENTOLIN HFA) 108 (90 Base) MCG/ACT inhaler Inhale 2 puffs into the lungs every 6 (six) hours as needed for wheezing or shortness of breath. 1 Inhaler 0  ? albuterol (VENTOLIN HFA) 108 (90 Base) MCG/ACT inhaler Inhale 2 puffs into the  lungs every 6 (six) hours as needed. 18 g 0  ? amLODipine (NORVASC) 10 MG tablet Take 1 tablet (10 mg total) by mouth daily. 90 tablet 3  ? aspirin 81 MG tablet Take 81 mg by mouth daily.    ? budesonide-formoterol (SYMBICORT) 160-4.5 MCG/ACT inhaler Inhale 2 puffs into the lungs 2 (two) times daily. 1 each 12  ? carvedilol (COREG) 25 MG tablet Take 1 tablet (25 mg total) by mouth 2 (two) times daily with a meal. 180 tablet 3  ? digoxin (LANOXIN) 0.125 MG tablet Take 1 tablet (0.125 mg total) by mouth daily. 90 tablet 3  ? doxazosin (CARDURA) 8 MG tablet Take 1 tablet (8 mg total) by mouth daily. 90 tablet 3  ? fluticasone (FLONASE) 50 MCG/ACT nasal spray Place 2 sprays into both nostrils daily. 16 g 1  ? furosemide (LASIX) 20 MG tablet Take 0.5 tablets (10 mg total) by mouth daily. 90 tablet 3  ? hydrALAZINE (APRESOLINE) 100 MG tablet Take 1 tablet (100 mg total) by mouth 3 (three) times daily. (Patient taking differently: Take 100 mg by mouth 2 (two) times daily.) 270 tablet 3  ? hydrOXYzine (ATARAX/VISTARIL) 10 MG tablet 1-2 tab po q 8 hours as needed itching. 30 tablet 0  ? ipratropium (ATROVENT HFA) 17 MCG/ACT inhaler Inhale 2 puffs into the lungs every 6 (six) hours as needed for wheezing. 1 Inhaler 3  ? ISOSORBIDE MONONITRATE PO Take 60 mg by mouth daily.    ? meclizine (ANTIVERT) 12.5 MG tablet Take 1 tablet (12.5 mg total) by mouth 3 (three) times daily as needed for dizziness. 30 tablet 0  ? metroNIDAZOLE (FLAGYL) 250 MG tablet Take 1 tablet (250 mg total) by mouth 3 (three) times daily. 30 tablet 0  ? omeprazole (PRILOSEC) 40 MG capsule Take 1 capsule (40 mg total) by mouth daily. 90 capsule 3  ? ranitidine (ZANTAC) 150 MG capsule Take 1 capsule (150 mg total) by mouth 2 (two) times daily. (Patient taking differently: Take 150 mg by mouth as needed.) 60 capsule 6  ? sacubitril-valsartan (ENTRESTO) 49-51 MG Take 1 tablet by mouth 2 (two) times daily. 60 tablet 11  ? spironolactone (ALDACTONE) 25 MG tablet  Take 1 tablet (25 mg total) by mouth daily. 90 tablet 3  ? ?No current facility-administered medications on file prior to visit.  ? ? ?BP (!) 160/90   Pulse 69   Resp 18   Ht 5\' 7"  (1.702 m)   Wt 200 lb 12.8 oz (91.1 kg)   SpO2  97%   BMI 31.45 kg/m?  ? 154/80 on recheck.  ?   ?Objective:  ? Physical Exam ? ?General ?Mental Status- Alert. General Appearance- Not in acute distress.  ? ?Skin ?General: Color- Normal Color. Moisture- Normal Moisture. ? ?Neck ?Carotid Arteries- Normal color. Moisture- Normal Moisture. No carotid bruits. No JVD. ? ?Chest and Lung Exam ?Auscultation: ?Breath Sounds:-Normal. ? ?Cardiovascular ?Auscultation:Rythm- Regular. ?Murmurs & Other Heart Sounds:Auscultation of the heart reveals- No Murmurs. ? ?Abdomen ?Inspection:-Inspeection Normal. ?Palpation/Percussion:Note:No mass. Palpation and Percussion of the abdomen reveal- Non Tender, Non Distended + BS, no rebound or guarding. ? ? ?Neurologic ?Cranial Nerve exam:- CN III-XII intact(No nystagmus), symmetric smile. ?Strength:- 5/5 equal and symmetric strength both upper and lower extremities.  ? ?Lower ext- no pedal edema. ? ? ?Left 2nd digit- mild tender to palpation over dip joint. ?Left foot- slight tender at base of toe but more pain at distal first metatarsal. ? ?Heent- faint maxillary sinus pressure to palpation. ?   ?Assessment & Plan:  ? ?Patient Instructions  ?Left index finger and left great toe/foot area pain for 3 to 4 months.  We will get x-rays of both areas and a uric acid.  Tenderness you describe over the past month may indicate gout.  Treatment to be determined after imaging and lab results. ? ?Hypertension with history of CHF-blood pressure not ideally controlled.  We will get BNP today and metabolic panel.  Continue spironolactone 25mg  daily ?carvedilol 25mg  BID,  ? Digoxin 0.125mg  daily,  ?hydralazine three times a day (advised that if midday dose gives him headaches he can take 1/2 tablet) ?isosorbide 60mg   daily. ?Pt still on entresto 49-51. ? ? ? ?Elevated blood sugar-get A1c. ? ?For allergic rhinitis-continue Zyrtec.  Adding Flonase nasal spray and Patanol eyedrops. ? ?Some sinus pressure as well with some intermitte

## 2021-09-27 NOTE — Addendum Note (Signed)
Addended by: Anabel Halon on: 09/27/2021 12:31 PM ? ? Modules accepted: Orders ? ?

## 2021-10-11 ENCOUNTER — Ambulatory Visit (INDEPENDENT_AMBULATORY_CARE_PROVIDER_SITE_OTHER): Payer: Medicare Other

## 2021-10-11 ENCOUNTER — Encounter: Payer: Self-pay | Admitting: Podiatry

## 2021-10-11 ENCOUNTER — Ambulatory Visit (INDEPENDENT_AMBULATORY_CARE_PROVIDER_SITE_OTHER): Payer: Medicare Other | Admitting: Podiatry

## 2021-10-11 DIAGNOSIS — M722 Plantar fascial fibromatosis: Secondary | ICD-10-CM | POA: Diagnosis not present

## 2021-10-11 DIAGNOSIS — M778 Other enthesopathies, not elsewhere classified: Secondary | ICD-10-CM | POA: Diagnosis not present

## 2021-10-11 NOTE — Patient Instructions (Signed)

## 2021-10-14 NOTE — Progress Notes (Signed)
Subjective:  ? ?Patient ID: Matthew Nixon, male   DOB: 61 y.o.   MRN: [id number]  ? ?HPI ?61 year old male presents the office today with concerns of pain to his left first MPJ which has been on for the last couple weeks.  He states that he previously went to a doctor when he was on vacation in [place] and was told he had gout.  Is tried anti-inflammatory lotion which has been helping.  Recently started discomfort to the bottom of the heel.  No injuries that he reports.  He has changed his diet which has been helping the big toe as well. ? ? ?Review of Systems  ?All other systems reviewed and are negative. ? ?Past Medical History:  ?Diagnosis Date  ? Allergy   ? CAD (coronary artery disease), native coronary artery   ? 25-49% prox to mid RCA by coronary CTA 11/2020  ? Cardiomyopathy ([place])   ? DCM with EF 32% on echo 2014 and no ischemia on nuclear stress test.  EF improved to 40-45% by echo 02/2017  ? Chronic back pain   ? low back  ? Chronic systolic CHF (congestive heart failure), NYHA class 2 ([place]) 08/07/2016  ? COPD (chronic obstructive pulmonary disease) ([place])   ? Dyspnea   ? with exertion  ? EKG, abnormal   ? history of left bundle block since 2002 per pt  ? GERD (gastroesophageal reflux disease)   ? Headache   ? Hypertension   ? LBBB (left bundle branch block)   ? OSA on CPAP 08/07/2016  ? Oxygen deficiency   ? Pain   ? right side pain for many years  ? PTSD (post-traumatic stress disorder)   ? Sleep apnea   ? ? ?Past Surgical History:  ?Procedure Laterality Date  ? ANKLE SURGERY Right   ? CARDIAC CATHETERIZATION  yrs ago  ? ESOPHAGOGASTRODUODENOSCOPY (EGD) WITH PROPOFOL N/A 01/13/2017  ? Procedure: ESOPHAGOGASTRODUODENOSCOPY (EGD) WITH PROPOFOL;  Surgeon: Irene Shipper, MD;  Location: [facility];  Service: Endoscopy;  Laterality: N/A;  ? HAND SURGERY    ? Right  ? HAND SURGERY    ? Left carpel tunnel  ? THUMB ARTHROSCOPY Left   ? ? ? ?Current Outpatient Medications:  ?  albuterol (PROVENTIL  HFA;VENTOLIN HFA) 108 (90 Base) MCG/ACT inhaler, Inhale 2 puffs into the lungs every 6 (six) hours as needed for wheezing or shortness of breath., Disp: 1 Inhaler, Rfl: 0 ?  albuterol (VENTOLIN HFA) 108 (90 Base) MCG/ACT inhaler, Inhale 2 puffs into the lungs every 6 (six) hours as needed., Disp: 18 g, Rfl: 0 ?  amLODipine (NORVASC) 10 MG tablet, Take 1 tablet (10 mg total) by mouth daily., Disp: 90 tablet, Rfl: 3 ?  aspirin 81 MG tablet, Take 81 mg by mouth daily., Disp: , Rfl:  ?  budesonide-formoterol (SYMBICORT) 160-4.5 MCG/ACT inhaler, Inhale 2 puffs into the lungs 2 (two) times daily., Disp: 1 each, Rfl: 12 ?  carvedilol (COREG) 25 MG tablet, Take 1 tablet (25 mg total) by mouth 2 (two) times daily with a meal., Disp: 180 tablet, Rfl: 3 ?  cephALEXin (KEFLEX) 500 MG capsule, Take 1 capsule (500 mg total) by mouth 2 (two) times daily., Disp: 20 capsule, Rfl: 0 ?  digoxin (LANOXIN) 0.125 MG tablet, Take 1 tablet (0.125 mg total) by mouth daily., Disp: 90 tablet, Rfl: 3 ?  doxazosin (CARDURA) 8 MG tablet, Take 1 tablet (8 mg total) by mouth daily., Disp: 90 tablet, Rfl: 3 ?  FLOVENT HFA 220 MCG/ACT inhaler, Inhale into the lungs., Disp: , Rfl:  ?  fluticasone (FLONASE) 50 MCG/ACT nasal spray, Place 2 sprays into both nostrils daily., Disp: 16 g, Rfl: 1 ?  fluticasone (FLONASE) 50 MCG/ACT nasal spray, Place 2 sprays into both nostrils daily., Disp: 16 g, Rfl: 1 ?  furosemide (LASIX) 20 MG tablet, Take 0.5 tablets (10 mg total) by mouth daily., Disp: 90 tablet, Rfl: 3 ?  hydrALAZINE (APRESOLINE) 100 MG tablet, Take 1 tablet (100 mg total) by mouth 3 (three) times daily. (Patient taking differently: Take 100 mg by mouth 2 (two) times daily.), Disp: 270 tablet, Rfl: 3 ?  hydrOXYzine (ATARAX/VISTARIL) 10 MG tablet, 1-2 tab po q 8 hours as needed itching., Disp: 30 tablet, Rfl: 0 ?  ipratropium (ATROVENT HFA) 17 MCG/ACT inhaler, Inhale 2 puffs into the lungs every 6 (six) hours as needed for wheezing., Disp: 1 Inhaler,  Rfl: 3 ?  ISOSORBIDE MONONITRATE PO, Take 60 mg by mouth daily., Disp: , Rfl:  ?  meclizine (ANTIVERT) 12.5 MG tablet, Take 1 tablet (12.5 mg total) by mouth 3 (three) times daily as needed for dizziness., Disp: 30 tablet, Rfl: 0 ?  metroNIDAZOLE (FLAGYL) 250 MG tablet, Take 1 tablet (250 mg total) by mouth 3 (three) times daily., Disp: 30 tablet, Rfl: 0 ?  olopatadine (PATANOL) 0.1 % ophthalmic solution, Place 1 drop into both eyes 2 (two) times daily., Disp: 5 mL, Rfl: 12 ?  omeprazole (PRILOSEC) 40 MG capsule, Take 1 capsule (40 mg total) by mouth daily., Disp: 90 capsule, Rfl: 3 ?  ranitidine (ZANTAC) 150 MG capsule, Take 1 capsule (150 mg total) by mouth 2 (two) times daily. (Patient taking differently: Take 150 mg by mouth as needed.), Disp: 60 capsule, Rfl: 6 ?  sacubitril-valsartan (ENTRESTO) 49-51 MG, Take 1 tablet by mouth 2 (two) times daily., Disp: 60 tablet, Rfl: 11 ?  spironolactone (ALDACTONE) 25 MG tablet, Take 1 tablet (25 mg total) by mouth daily., Disp: 90 tablet, Rfl: 3 ?  terbinafine (LAMISIL) 250 MG tablet, Take by mouth., Disp: , Rfl:  ? ?No Known Allergies ? ? ? ? ?   ?Objective:  ?Physical Exam  ?General: AAO x3, NAD ? ?Dermatological: Skin is warm, dry and supple bilateral.  There are no open sores, no preulcerative lesions, no rash or signs of infection present. ? ?Vascular: Dorsalis Pedis artery and Posterior Tibial artery pedal pulses are 2/4 bilateral with immedate capillary fill time. There is no pain with calf compression, swelling, warmth, erythema.  ? ?Neruologic: Grossly intact via light touch bilateral.  Negative Tinel sign. ? ?Musculoskeletal: Mild tenderness along the first MPJ of the left foot.  There is no crepitation or restriction with MPJ range of motion.  There is mild discomfort on the plantar medial tubercle of the calcaneus at the insertion of plantar fascia on the left heel.  There is no painful compression of the calcaneus.  No edema, erythema.  Muscular strength 5/5  in all groups tested bilateral. ? ?Gait: Unassisted, Nonantalgic.  ? ? ?   ?Assessment:  ? ?Capsulitis first MPJ, heel pain likely Plantar fasciitis as a result of compensation ? ?   ?Plan:  ?-Treatment options discussed including all alternatives, risks, and complications ?-Etiology of symptoms were discussed ?-X-rays were obtained and reviewed with the patient.  3 views of the left foot were obtained.  Mild bunion is present.  There is no evidence of acute fracture. ?-Regards to the first MPJ this could be  resolving gout.  Discussed continuing anti-inflammatories as well as icing.  Discussed shoes and good arch support. ?-For the plantar fascia as we discussed continued anti-inflammatory cream as well as stretching, icing on a regular basis as well as wearing shoes and good arch support.  If symptoms continue consider steroid injection. ? ?Trula Slade DPM ? ?   ? ?

## 2021-11-25 ENCOUNTER — Telehealth: Payer: Self-pay | Admitting: Medical

## 2021-11-25 NOTE — Telephone Encounter (Signed)
Left message for patient to call back and schedule Medicare Annual Wellness Visit (AWV) in office.   If not able to come in office, please offer to do virtually or by telephone.  Left office number and my jabber [phone].  AWVI eligible as of 11/22/2014   Please schedule at anytime with Nurse Health Advisor.

## 2021-12-19 ENCOUNTER — Ambulatory Visit (INDEPENDENT_AMBULATORY_CARE_PROVIDER_SITE_OTHER): Payer: Medicare Other | Admitting: Medical

## 2021-12-19 ENCOUNTER — Ambulatory Visit (HOSPITAL_BASED_OUTPATIENT_CLINIC_OR_DEPARTMENT_OTHER)
Admission: RE | Admit: 2021-12-19 | Discharge: 2021-12-19 | Disposition: A | Discharge status: Home / Self Care | Payer: Medicare Other | Source: Ambulatory Visit | Attending: Medical | Admitting: Medical

## 2021-12-19 VITALS — BP 160/64 | HR 88 | Resp 18 | Ht 67.0 in | Wt 198.0 lb

## 2021-12-19 DIAGNOSIS — R1011 Right upper quadrant pain: Secondary | ICD-10-CM | POA: Diagnosis not present

## 2021-12-19 DIAGNOSIS — M79672 Pain in left foot: Secondary | ICD-10-CM | POA: Diagnosis not present

## 2021-12-19 DIAGNOSIS — M79671 Pain in right foot: Secondary | ICD-10-CM

## 2021-12-19 LAB — COMPREHENSIVE METABOLIC PANEL
ALT: 59 U/L — ABNORMAL HIGH (ref 0–53)
AST: 29 U/L (ref 0–37)
Albumin: 4.6 g/dL (ref 3.5–5.2)
Alkaline Phosphatase: 71 U/L (ref 39–117)
BUN: 18 mg/dL (ref 6–23)
CO2: 29 mEq/L (ref 19–32)
Calcium: 10.1 mg/dL (ref 8.4–10.5)
Chloride: 106 mEq/L (ref 96–112)
Creatinine, Ser: 1.07 mg/dL (ref 0.40–1.50)
GFR: 74.62 mL/min (ref >60.00)
Glucose, Bld: 89 mg/dL (ref 70–99)
Potassium: 5 mEq/L (ref 3.5–5.1)
Sodium: 141 mEq/L (ref 135–145)
Total Bilirubin: 0.4 mg/dL (ref 0.2–1.2)
Total Protein: 7.1 g/dL (ref 6.0–8.3)

## 2021-12-19 LAB — CBC WITH DIFFERENTIAL/PLATELET
Basophils Absolute: 0 10*3/uL (ref 0.0–0.1)
Basophils Relative: 0.4 % (ref 0.0–3.0)
Eosinophils Absolute: 0.1 10*3/uL (ref 0.0–0.7)
Eosinophils Relative: 1.5 % (ref 0.0–5.0)
HCT: 50.5 % (ref 39.0–52.0)
Hemoglobin: 16.7 g/dL (ref 13.0–17.0)
Lymphocytes Relative: 30.9 % (ref 12.0–46.0)
Lymphs Abs: 1.8 10*3/uL (ref 0.7–4.0)
MCHC: 33.2 g/dL (ref 30.0–36.0)
MCV: 95.8 fl (ref 78.0–100.0)
Monocytes Absolute: 0.5 10*3/uL (ref 0.1–1.0)
Monocytes Relative: 9.2 % (ref 3.0–12.0)
Neutro Abs: 3.4 10*3/uL (ref 1.4–7.7)
Neutrophils Relative %: 58 % (ref 43.0–77.0)
Platelets: 232 10*3/uL (ref 150.0–400.0)
RBC: 5.27 Mil/uL (ref 4.22–5.81)
RDW: 14 % (ref 11.5–15.5)
WBC: 5.8 10*3/uL (ref 4.0–10.5)

## 2021-12-19 LAB — LIPASE: Lipase: 22 U/L (ref 11.0–59.0)

## 2021-12-19 NOTE — Progress Notes (Signed)
Subjective:    Patient ID: Matthew Nixon, male    DOB: December 04, 1960, 61 y.o.   MRN: [id number]  HPI Pt in for rt side abdomen pain just recently.  Pt reminds me that he did Dr. Carlean Purl in the past for similar pain. Below portions of hpi from gi visit in "   "Chronic right upper quadrant pain.  Also history of GERD and recurrent nausea with vomiting/chills.  Patient was last seen in this office March 30, 2018.  At that time chronic GERD symptoms were controlled with his acid reflux regimen and weight reduction.  His chronic right upper quadrant pain was felt to be musculoskeletal.  He had negative Cologuard testing July 2018.  Patient presents today for follow-up.  He tells me that [place] had been living [place]  In December 2021 he developed an acute illness consisting of nausea with vomiting, diarrhea and abdominal discomfort."    Pt states most of time his gerd is controlled with omeprazole. He also describes postprandial bloating and increased gas as manifested by flatus. No black or blood stools.   US abdomen in past   IMPRESSION: 1. Negative for gallstones or biliary dilatation. 2. Echogenic liver consistent with steatosis and or hepatocellular disease.  2019  ct study showed.  IMPRESSION: No nephrolithiasis or hydroureteronephrosis is identified bilaterally.   No acute abnormality identified in the abdomen and pelvis.  No cva pain.   Also having heel pain initially on left side and now some on rt side.   -Treatment options discussed including all alternatives, risks, and complications -Etiology of symptoms were discussed -X-rays were obtained and reviewed with the patient.  3 views of the left foot were obtained.  Mild bunion is present.  There is no evidence of acute fracture. -Regards to the first MPJ this could be resolving gout.  Discussed continuing anti-inflammatories as well as icing.  Discussed shoes and good arch support. -For the plantar  fascia as we discussed continued anti-inflammatory cream as well as stretching, icing on a regular basis as well as wearing shoes and good arch support.  If symptoms continue consider steroid injection.  When he walks has 10 level pain. Particularly in morning.    Review of Systems  Constitutional:  Negative for chills, fatigue and fever.  Respiratory:  Negative for cough, chest tightness, shortness of breath and wheezing.   Cardiovascular:  Negative for chest pain and palpitations.  Gastrointestinal:  Positive for abdominal pain. Negative for blood in stool, constipation, diarrhea, nausea and rectal pain.  Genitourinary:  Negative for dysuria.  Musculoskeletal:  Negative for back pain.  Skin:  Negative for rash.   Past Medical History:  Diagnosis Date   Allergy    CAD (coronary artery disease), native coronary artery    25-49% prox to mid RCA by coronary CTA 11/2020   Cardiomyopathy [facility])    DCM with EF 32% on echo 2014 and no ischemia on nuclear stress test.  EF improved to 40-45% by echo 02/2017   Chronic back pain    low back   Chronic systolic CHF (congestive heart failure), NYHA class 2 ([place]) 08/07/2016   COPD (chronic obstructive pulmonary disease) ([place])    Dyspnea    with exertion   EKG, abnormal    history of left bundle block since 2002 per pt   GERD (gastroesophageal reflux disease)    Headache    Hypertension    LBBB (left bundle branch block)    OSA on CPAP  08/07/2016   Oxygen deficiency    Pain    right side pain for many years   PTSD (post-traumatic stress disorder)    Sleep apnea      Social History   Socioeconomic History   Marital status: Married    Spouse name: Not on file   Number of children: Not on file   Years of education: Not on file   Highest education level: Not on file  Occupational History   Not on file  Tobacco Use   Smoking status: Never   Smokeless tobacco: Never  Vaping Use   Vaping Use: Never used  Substance and Sexual Activity    Alcohol use: No   Drug use: No   Sexual activity: Not on file  Other Topics Concern   Not on file  Social History Narrative   2 years college    Social Determinants of Health   Financial Resource Strain: Not on file  Food Insecurity: Not on file  Transportation Needs: Not on file  Physical Activity: Not on file  Stress: Not on file  Social Connections: Not on file  Intimate Partner Violence: Not on file    Past Surgical History:  Procedure Laterality Date   ANKLE SURGERY Right    CARDIAC CATHETERIZATION  yrs ago   ESOPHAGOGASTRODUODENOSCOPY (EGD) WITH PROPOFOL N/A 01/13/2017   Procedure: ESOPHAGOGASTRODUODENOSCOPY (EGD) WITH PROPOFOL;  Surgeon: Irene Shipper, MD;  Location: [facility];  Service: Endoscopy;  Laterality: N/A;   HAND SURGERY     Right   HAND SURGERY     Left carpel tunnel   THUMB ARTHROSCOPY Left     Family History  Problem Relation Age of Onset   Heart disease Mother    Heart disease Father    Colon cancer Neg Hx    Esophageal cancer Neg Hx    Pancreatic cancer Neg Hx    Stomach cancer Neg Hx     No Known Allergies  Current Outpatient Medications on File Prior to Visit  Medication Sig Dispense Refill   albuterol (PROVENTIL HFA;VENTOLIN HFA) 108 (90 Base) MCG/ACT inhaler Inhale 2 puffs into the lungs every 6 (six) hours as needed for wheezing or shortness of breath. 1 Inhaler 0   albuterol (VENTOLIN HFA) 108 (90 Base) MCG/ACT inhaler Inhale 2 puffs into the lungs every 6 (six) hours as needed. 18 g 0   amLODipine (NORVASC) 10 MG tablet Take 1 tablet (10 mg total) by mouth daily. 90 tablet 3   aspirin 81 MG tablet Take 81 mg by mouth daily.     budesonide-formoterol (SYMBICORT) 160-4.5 MCG/ACT inhaler Inhale 2 puffs into the lungs 2 (two) times daily. 1 each 12   carvedilol (COREG) 25 MG tablet Take 1 tablet (25 mg total) by mouth 2 (two) times daily with a meal. 180 tablet 3   digoxin (LANOXIN) 0.125 MG tablet Take 1 tablet (0.125 mg total) by  mouth daily. 90 tablet 3   doxazosin (CARDURA) 8 MG tablet Take 1 tablet (8 mg total) by mouth daily. 90 tablet 3   FLOVENT HFA 220 MCG/ACT inhaler Inhale into the lungs.     fluticasone (FLONASE) 50 MCG/ACT nasal spray Place 2 sprays into both nostrils daily. 16 g 1   fluticasone (FLONASE) 50 MCG/ACT nasal spray Place 2 sprays into both nostrils daily. 16 g 1   furosemide (LASIX) 20 MG tablet Take 0.5 tablets (10 mg total) by mouth daily. 90 tablet 3   hydrALAZINE (APRESOLINE) 100 MG tablet Take  1 tablet (100 mg total) by mouth 3 (three) times daily. (Patient taking differently: Take 100 mg by mouth 2 (two) times daily.) 270 tablet 3   hydrOXYzine (ATARAX/VISTARIL) 10 MG tablet 1-2 tab po q 8 hours as needed itching. 30 tablet 0   ipratropium (ATROVENT HFA) 17 MCG/ACT inhaler Inhale 2 puffs into the lungs every 6 (six) hours as needed for wheezing. 1 Inhaler 3   ISOSORBIDE MONONITRATE PO Take 60 mg by mouth daily.     meclizine (ANTIVERT) 12.5 MG tablet Take 1 tablet (12.5 mg total) by mouth 3 (three) times daily as needed for dizziness. 30 tablet 0   metroNIDAZOLE (FLAGYL) 250 MG tablet Take 1 tablet (250 mg total) by mouth 3 (three) times daily. 30 tablet 0   olopatadine (PATANOL) 0.1 % ophthalmic solution Place 1 drop into both eyes 2 (two) times daily. 5 mL 12   omeprazole (PRILOSEC) 40 MG capsule Take 1 capsule (40 mg total) by mouth daily. 90 capsule 3   ranitidine (ZANTAC) 150 MG capsule Take 1 capsule (150 mg total) by mouth 2 (two) times daily. (Patient taking differently: Take 150 mg by mouth as needed.) 60 capsule 6   sacubitril-valsartan (ENTRESTO) 49-51 MG Take 1 tablet by mouth 2 (two) times daily. 60 tablet 11   spironolactone (ALDACTONE) 25 MG tablet Take 1 tablet (25 mg total) by mouth daily. 90 tablet 3   terbinafine (LAMISIL) 250 MG tablet Take by mouth.     No current facility-administered medications on file prior to visit.    BP (!) 160/64   Pulse 88   Resp 18   Ht 5'  7" (1.702 m)   Wt 198 lb (89.8 kg)   SpO2 96%   BMI 31.01 kg/m        Objective:   Physical Exam  General- No acute distress. Pleasant patient. Neck- Full range of motion, no jvd Lungs- Clear, even and unlabored. Heart- regular rate and rhythm. Neurologic- CNII- XII grossly intact.  Abdomen-soft, nondistended, positive bowel sounds and no rebound or guarding.  Right upper quadrant point tender area.  Do not feel hernia like a bulge.  No redness or warmth to skin. Back-no CVA tenderness Feet-bilaterally heel tenderness worse on the left side.      Assessment & Plan:   Patient Instructions  Abdomen pain primarily in the right upper quadrant region.  Pain can be high level at times.  Tender to palpation in that area.  No fatty liver but I do not think pain consistent with fatty liver.  We will get CBC, CMP and lipase stat.  Also want to get abdomen ultrasound later today or at the latest tomorrow morning.  Will assess gallbladder.  Also after review of lab and imaging studies likely go ahead and refer back to Dr. Carlean Purl GI MD.  Patient mentions that Dr. Carlean Purl mention possible gallbladder etiology.  If labs/ultrasound imaging negative and pain worsening pending GI MD referral then would consider CT abdomen and pelvis.  Advised patient continue bland healthy diet and omeprazole.  For bilateral feet pain with probable plantars fasciitis decided would get opinion from sports medicine.  Patient reluctant to use steroid injection.  He is done conservative measures and with his medical history oral NSAID or oral prednisone not indicated.  Follow-up next Wednesday   Mackie Pai, PA-C

## 2021-12-19 NOTE — Patient Instructions (Addendum)
Abdomen pain primarily in the right upper quadrant region.  Pain can be high level at times.  Tender to palpation in that area.  No fatty liver but I do not think pain consistent with fatty liver.  We will get CBC, CMP and lipase stat.  Also want to get abdomen ultrasound later today or at the latest tomorrow morning.  Will assess gallbladder.  Also after review of lab and imaging studies likely go ahead and refer back to Dr. Carlean Purl GI MD.  Patient mentions that Dr. Carlean Purl mention possible gallbladder etiology.  If labs/ultrasound imaging negative and pain worsening pending GI MD referral then would consider CT abdomen and pelvis.  Advised patient continue bland healthy diet and omeprazole.  For bilateral feet pain with probable plantars fasciitis decided would get opinion from sports medicine.  Patient reluctant to use steroid injection.  He is done conservative measures and with his medical history oral NSAID or oral prednisone not indicated.  Follow-up next Wednesday or sooner if needed.

## 2021-12-20 ENCOUNTER — Ambulatory Visit (HOSPITAL_BASED_OUTPATIENT_CLINIC_OR_DEPARTMENT_OTHER): Payer: Medicare Other

## 2021-12-23 ENCOUNTER — Ambulatory Visit (HOSPITAL_BASED_OUTPATIENT_CLINIC_OR_DEPARTMENT_OTHER)
Admission: RE | Admit: 2021-12-23 | Discharge: 2021-12-23 | Disposition: A | Discharge status: Home / Self Care | Payer: Medicare Other | Source: Ambulatory Visit | Attending: Medical | Admitting: Medical

## 2021-12-23 DIAGNOSIS — R1011 Right upper quadrant pain: Secondary | ICD-10-CM | POA: Diagnosis not present

## 2021-12-23 DIAGNOSIS — K7689 Other specified diseases of liver: Secondary | ICD-10-CM | POA: Diagnosis not present

## 2021-12-23 NOTE — Addendum Note (Signed)
Addended by: Anabel Halon on: 12/23/2021 06:40 PM   Modules accepted: Orders

## 2022-01-01 ENCOUNTER — Ambulatory Visit (INDEPENDENT_AMBULATORY_CARE_PROVIDER_SITE_OTHER): Payer: Medicare Other | Admitting: Family Medicine

## 2022-01-01 ENCOUNTER — Encounter: Payer: Self-pay | Admitting: Family Medicine

## 2022-01-01 ENCOUNTER — Ambulatory Visit: Payer: Self-pay

## 2022-01-01 VITALS — BP 128/82 | Ht 67.0 in | Wt 198.0 lb

## 2022-01-01 DIAGNOSIS — E65 Localized adiposity: Secondary | ICD-10-CM | POA: Diagnosis not present

## 2022-01-01 DIAGNOSIS — M659 Synovitis and tenosynovitis, unspecified: Secondary | ICD-10-CM | POA: Diagnosis not present

## 2022-01-01 DIAGNOSIS — M79672 Pain in left foot: Secondary | ICD-10-CM

## 2022-01-01 NOTE — Assessment & Plan Note (Signed)
Acutely occurring.  Does have effusion with synovial type changes within the joint.  Seems more consistent with inflammatory history of gout being a possibility. -Counseled on home exercise therapy and supportive care. -Uric acid, ANA, sed rate and CRP. -Consult with pharmacist.  Could consider low-dose of prednisone 10 mg as to avoid anti-inflammatories due to his heart failure and colchicine as interaction with his Coreg.  Could consider initiating allopurinol.

## 2022-01-01 NOTE — Patient Instructions (Signed)
Nice to meet you You can consider gel heel cups to help with the healing. I will call with the lab results from today. I am going to have to try a different medicine because there is an interaction with your current medication. Please send me a message in MyChart with any questions or updates.  Please see me back in 4 weeks.   --Dr. Raeford Razor

## 2022-01-01 NOTE — Progress Notes (Signed)
  Matthew Nixon - 61 y.o. male MRN [id number]  Date of birth: Sep 28, 1960  SUBJECTIVE:  Including CC & ROS.  No chief complaint on file.   Matthew Nixon is a 62 y.o. male that is  presenting with left great toe pain and heel pain. Acute on chronic in nature. The great toe pain was sensitive to touch.  Having pain at the lateral portion of the calcaneus.  Review of the office visit note from 6/29 shows suggestive of bladder fasciitis and counseled supportive care.   Review of Systems See HPI   HISTORY: Past Medical, Surgical, Social, and Family History Reviewed & Updated per EMR.   Pertinent Historical Findings include:  Past Medical History:  Diagnosis Date   Allergy    CAD (coronary artery disease), native coronary artery    25-49% prox to mid RCA by coronary CTA 11/2020   Cardiomyopathy [facility])    DCM with EF 32% on echo 2014 and no ischemia on nuclear stress test.  EF improved to 40-45% by echo 02/2017   Chronic back pain    low back   Chronic systolic CHF (congestive heart failure), NYHA class 2 ([place]) 08/07/2016   COPD (chronic obstructive pulmonary disease) ([place])    Dyspnea    with exertion   EKG, abnormal    history of left bundle block since 2002 per pt   GERD (gastroesophageal reflux disease)    Headache    Hypertension    LBBB (left bundle branch block)    OSA on CPAP 08/07/2016   Oxygen deficiency    Pain    right side pain for many years   PTSD (post-traumatic stress disorder)    Sleep apnea     Past Surgical History:  Procedure Laterality Date   ANKLE SURGERY Right    CARDIAC CATHETERIZATION  yrs ago   ESOPHAGOGASTRODUODENOSCOPY (EGD) WITH PROPOFOL N/A 01/13/2017   Procedure: ESOPHAGOGASTRODUODENOSCOPY (EGD) WITH PROPOFOL;  Surgeon: Irene Shipper, MD;  Location: [facility];  Service: Endoscopy;  Laterality: N/A;   HAND SURGERY     Right   HAND SURGERY     Left carpel tunnel   THUMB ARTHROSCOPY Left      PHYSICAL EXAM:  VS: BP 128/82 (BP Location:  Left Arm, Patient Position: Sitting)   Ht 5\' 7"  (1.702 m)   Wt 198 lb (89.8 kg)   BMI 31.01 kg/m  Physical Exam Gen: NAD, alert, cooperative with exam, well-appearing MSK:  Neurovascularly intact    Limited ultrasound: Left foot:  Effusion noted of the first MTP joint with mild synovitis. No significant changes of the plantar fascia. No changes of the calcaneus.  Summary: Synovitis first MTP joint  Ultrasound and interpretation by Clearance Coots, MD    ASSESSMENT & PLAN:   Synovitis of toe Acutely occurring.  Does have effusion with synovial type changes within the joint.  Seems more consistent with inflammatory history of gout being a possibility. -Counseled on home exercise therapy and supportive care. -Uric acid, ANA, sed rate and CRP. -Consult with pharmacist.  Could consider low-dose of prednisone 10 mg as to avoid anti-inflammatories due to his heart failure and colchicine as interaction with his Coreg.  Could consider initiating allopurinol.  Fat pad syndrome Acutely occurring.  Symptoms are more lateral at the calcaneus.  No significant changes in within the plantar fascia.  Likely more related to adjusting to alleviate pain from the great toe. -Counseled on home exercise therapy and supportive care. -Could consider insoles.   [name]

## 2022-01-01 NOTE — Assessment & Plan Note (Signed)
Acutely occurring.  Symptoms are more lateral at the calcaneus.  No significant changes in within the plantar fascia.  Likely more related to adjusting to alleviate pain from the great toe. -Counseled on home exercise therapy and supportive care. -Could consider insoles.

## 2022-01-03 ENCOUNTER — Telehealth: Payer: Self-pay | Admitting: Family Medicine

## 2022-01-03 LAB — URIC ACID: Uric Acid: 7.8 mg/dL (ref 3.8–8.4)

## 2022-01-03 LAB — SEDIMENTATION RATE: Sed Rate: 6 mm/hr (ref 0–30)

## 2022-01-03 LAB — ANA,IFA RA DIAG PNL W/RFLX TIT/PATN
ANA Titer 1: NEGATIVE
Cyclic Citrullin Peptide Ab: 5 units (ref 0–19)
Rheumatoid fact SerPl-aCnc: <10 IU/mL (ref <14.0)

## 2022-01-03 LAB — C-REACTIVE PROTEIN: CRP: 1 mg/L (ref 0–10)

## 2022-01-03 NOTE — Telephone Encounter (Signed)
Left VM for patient. If he calls back please have him speak with a nurse/CMA and inform that his uric acid was mildly elevated.  We may need to consider starting some allopurinol to help prevent attacks.  His heart failure limits anti-inflammatories and we cannot use colchicine due to interaction with his current medications.  We can consider low doses of prednisone and allopurinol to help with his pain going forward.   If any questions then please take the best time and phone number to call and I will try to call him back.   Rosemarie Ax, MD [facility] 01/03/2022, 8:11 AM

## 2022-01-06 NOTE — Telephone Encounter (Signed)
Left message for patient to call back  

## 2022-01-20 ENCOUNTER — Encounter: Payer: Self-pay | Admitting: Medical

## 2022-01-29 ENCOUNTER — Ambulatory Visit (INDEPENDENT_AMBULATORY_CARE_PROVIDER_SITE_OTHER): Payer: Medicare Other | Admitting: Family Medicine

## 2022-01-29 ENCOUNTER — Encounter: Payer: Self-pay | Admitting: Medical

## 2022-01-29 ENCOUNTER — Ambulatory Visit (INDEPENDENT_AMBULATORY_CARE_PROVIDER_SITE_OTHER): Payer: Medicare Other | Admitting: Medical

## 2022-01-29 ENCOUNTER — Encounter: Payer: Self-pay | Admitting: Family Medicine

## 2022-01-29 VITALS — BP 130/60 | HR 78 | Temp 98.5°F | Ht 69.0 in | Wt 197.8 lb

## 2022-01-29 DIAGNOSIS — R6882 Decreased libido: Secondary | ICD-10-CM

## 2022-01-29 DIAGNOSIS — M659 Synovitis and tenosynovitis, unspecified: Secondary | ICD-10-CM

## 2022-01-29 DIAGNOSIS — I10 Essential (primary) hypertension: Secondary | ICD-10-CM | POA: Diagnosis not present

## 2022-01-29 DIAGNOSIS — E663 Overweight: Secondary | ICD-10-CM

## 2022-01-29 DIAGNOSIS — R5383 Other fatigue: Secondary | ICD-10-CM

## 2022-01-29 DIAGNOSIS — M10079 Idiopathic gout, unspecified ankle and foot: Secondary | ICD-10-CM

## 2022-01-29 DIAGNOSIS — I428 Other cardiomyopathies: Secondary | ICD-10-CM | POA: Diagnosis not present

## 2022-01-29 DIAGNOSIS — R251 Tremor, unspecified: Secondary | ICD-10-CM | POA: Diagnosis not present

## 2022-01-29 LAB — URIC ACID: Uric Acid, Serum: 7.7 mg/dL (ref 4.0–7.8)

## 2022-01-29 LAB — T4, FREE: Free T4: 1.02 ng/dL (ref 0.60–1.60)

## 2022-01-29 LAB — TSH: TSH: 1.87 u[IU]/mL (ref 0.35–5.50)

## 2022-01-29 MED ORDER — TRAMADOL HCL 50 MG PO TABS: 50.0000 mg | ORAL_TABLET | Freq: Three times a day (TID) | ORAL | 0 refills | Status: AC | PRN

## 2022-01-29 MED ORDER — ALLOPURINOL 100 MG PO TABS: 100.0000 mg | ORAL_TABLET | Freq: Every day | ORAL | 3 refills | Status: AC

## 2022-01-29 MED ORDER — PREDNISONE 10 MG PO TABS: 10.0000 mg | ORAL_TABLET | Freq: Every day | ORAL | 0 refills | Status: DC

## 2022-01-29 NOTE — Patient Instructions (Signed)
Good to see you Please start the prednisone and allopurinol at the same time. Please send me a message in MyChart with any questions or updates.  Please see me back in 4 weeks or as needed if better.   --Dr. Raeford Razor

## 2022-01-29 NOTE — Assessment & Plan Note (Signed)
Symptoms seem most consistent with gout as the origin.   -Counseled on home exercise therapy and supportive care. -Low-dose prednisone. -Allopurinol. -Could consider further imaging.

## 2022-01-29 NOTE — Progress Notes (Signed)
  Matthew Nixon - 61 y.o. male MRN [id number]  Date of birth: January 05, 1961  SUBJECTIVE:  Including CC & ROS.  No chief complaint on file.   Matthew Nixon is a 61 y.o. male that is presenting with acute on chronic left great toe pain.  Still having pain in the joint.  Was noticed to have an elevated uric acid.    Review of Systems See HPI   HISTORY: Past Medical, Surgical, Social, and Family History Reviewed & Updated per EMR.   Pertinent Historical Findings include:  Past Medical History:  Diagnosis Date   Allergy    CAD (coronary artery disease), native coronary artery    25-49% prox to mid RCA by coronary CTA 11/2020   Cardiomyopathy [facility])    DCM with EF 32% on echo 2014 and no ischemia on nuclear stress test.  EF improved to 40-45% by echo 02/2017   Chronic back pain    low back   Chronic systolic CHF (congestive heart failure), NYHA class 2 ([name]) 08/07/2016   COPD (chronic obstructive pulmonary disease) ([place])    Dyspnea    with exertion   EKG, abnormal    history of left bundle block since 2002 per pt   GERD (gastroesophageal reflux disease)    Headache    Hypertension    LBBB (left bundle branch block)    OSA on CPAP 08/07/2016   Oxygen deficiency    Pain    right side pain for many years   PTSD (post-traumatic stress disorder)    Sleep apnea     Past Surgical History:  Procedure Laterality Date   ANKLE SURGERY Right    CARDIAC CATHETERIZATION  yrs ago   ESOPHAGOGASTRODUODENOSCOPY (EGD) WITH PROPOFOL N/A 01/13/2017   Procedure: ESOPHAGOGASTRODUODENOSCOPY (EGD) WITH PROPOFOL;  Surgeon: Irene Shipper, MD;  Location: [facility];  Service: Endoscopy;  Laterality: N/A;   HAND SURGERY     Right   HAND SURGERY     Left carpel tunnel   THUMB ARTHROSCOPY Left      PHYSICAL EXAM:  VS: BP 130/60 (BP Location: Left Arm, Patient Position: Sitting)   Ht 5\' 9"  (1.753 m)   Wt 197 lb (89.4 kg)   BMI 29.09 kg/m  Physical Exam Gen: NAD, alert, cooperative with exam,  well-appearing MSK:  Neurovascularly intact       ASSESSMENT & PLAN:   Synovitis of toe Symptoms seem most consistent with gout as the origin.   -Counseled on home exercise therapy and supportive care. -Low-dose prednisone. -Allopurinol. -Could consider further imaging.

## 2022-01-29 NOTE — Progress Notes (Signed)
Subjective:    Patient ID: Matthew Nixon, male    DOB: 1961-03-18, 61 y.o.   MRN: [id number]  HPI Pt in for probable gout.  Pt did see Dr. Raeford Razor.  "Synovitis of toe Acutely occurring.  Does have effusion with synovial type changes within the joint.  Seems more consistent with inflammatory history of gout being a possibility. -Counseled on home exercise therapy and supportive care. -Uric acid, ANA, sed rate and CRP. -Consult with pharmacist.  Could consider low-dose of prednisone 10 mg as to avoid anti-inflammatories due to his heart failure and colchicine as interaction with his Coreg.  Could consider initiating allopurinol."   Pt does have uric acid elevation by labs. Has bottom of foot pain. Both sides toe pain at bases. Pt had pain since December.  Pt not drinking any alcohol. He does eat some red meat.   Pt states sometimes pain cause by even sheet on bed rubbing against his feet.  Pt has been using otc nsaid gel.   Hypertension- bp well controlled today.  Pt tells me his pain level is almost 9/10.   Pt mentioned endocrinology referral. No diabetes, no known thyroid disease and normal range testosterone(lower range testosterone bioavailable) Decreased libido.   He also wants nuturitionist referral.  Review of Systems  Constitutional:  Negative for chills, fatigue and fever.  HENT:  Negative for congestion, dental problem and ear discharge.   Respiratory:  Negative for cough, chest tightness, shortness of breath and wheezing.   Cardiovascular:  Negative for chest pain and palpitations.  Gastrointestinal:  Negative for abdominal pain, blood in stool and constipation.  Genitourinary:  Negative for dysuria, flank pain and frequency.  Musculoskeletal:  Negative for back pain, gait problem and joint swelling.  Skin:  Negative for rash.  Neurological:  Positive for tremors. Negative for dizziness, seizures, syncope, weakness and light-headedness.       Sporadic  intermittent bilateral hand tremors. For one year. Occurs about 5 seconds when gets. He can't specify frequency but overall he is noticing more. He thinks sometimes has shuffled gait.    Past Medical History:  Diagnosis Date   Allergy    CAD (coronary artery disease), native coronary artery    25-49% prox to mid RCA by coronary CTA 11/2020   Cardiomyopathy [facility])    DCM with EF 32% on echo 2014 and no ischemia on nuclear stress test.  EF improved to 40-45% by echo 02/2017   Chronic back pain    low back   Chronic systolic CHF (congestive heart failure), NYHA class 2 ([place]) 08/07/2016   COPD (chronic obstructive pulmonary disease) (HCC)    Dyspnea    with exertion   EKG, abnormal    history of left bundle block since 2002 per pt   GERD (gastroesophageal reflux disease)    Headache    Hypertension    LBBB (left bundle branch block)    OSA on CPAP 08/07/2016   Oxygen deficiency    Pain    right side pain for many years   PTSD (post-traumatic stress disorder)    Sleep apnea      Social History   Socioeconomic History   Marital status: Married    Spouse name: Not on file   Number of children: Not on file   Years of education: Not on file   Highest education level: Not on file  Occupational History   Not on file  Tobacco Use   Smoking status: Never   Smokeless tobacco:  Never  Vaping Use   Vaping Use: Never used  Substance and Sexual Activity   Alcohol use: No   Drug use: No   Sexual activity: Not on file  Other Topics Concern   Not on file  Social History Narrative   2 years college    Social Determinants of Health   Financial Resource Strain: Not on file  Food Insecurity: Not on file  Transportation Needs: Not on file  Physical Activity: Not on file  Stress: Not on file  Social Connections: Not on file  Intimate Partner Violence: Not on file    Past Surgical History:  Procedure Laterality Date   ANKLE SURGERY Right    CARDIAC CATHETERIZATION  yrs ago    ESOPHAGOGASTRODUODENOSCOPY (EGD) WITH PROPOFOL N/A 01/13/2017   Procedure: ESOPHAGOGASTRODUODENOSCOPY (EGD) WITH PROPOFOL;  Surgeon: Irene Shipper, MD;  Location: [facility];  Service: Endoscopy;  Laterality: N/A;   HAND SURGERY     Right   HAND SURGERY     Left carpel tunnel   THUMB ARTHROSCOPY Left     Family History  Problem Relation Age of Onset   Heart disease Mother    Heart disease Father    Colon cancer Neg Hx    Esophageal cancer Neg Hx    Pancreatic cancer Neg Hx    Stomach cancer Neg Hx     No Known Allergies  Current Outpatient Medications on File Prior to Visit  Medication Sig Dispense Refill   albuterol (VENTOLIN HFA) 108 (90 Base) MCG/ACT inhaler Inhale 2 puffs into the lungs every 6 (six) hours as needed. 18 g 0   amLODipine (NORVASC) 10 MG tablet Take 1 tablet (10 mg total) by mouth daily. 90 tablet 3   aspirin 81 MG tablet Take 81 mg by mouth daily.     budesonide-formoterol (SYMBICORT) 160-4.5 MCG/ACT inhaler Inhale 2 puffs into the lungs 2 (two) times daily. 1 each 12   carvedilol (COREG) 25 MG tablet Take 1 tablet (25 mg total) by mouth 2 (two) times daily with a meal. 180 tablet 3   digoxin (LANOXIN) 0.125 MG tablet Take 1 tablet (0.125 mg total) by mouth daily. 90 tablet 3   doxazosin (CARDURA) 8 MG tablet Take 1 tablet (8 mg total) by mouth daily. 90 tablet 3   FLOVENT HFA 220 MCG/ACT inhaler Inhale into the lungs.     fluticasone (FLONASE) 50 MCG/ACT nasal spray Place 2 sprays into both nostrils daily. 16 g 1   furosemide (LASIX) 20 MG tablet Take 0.5 tablets (10 mg total) by mouth daily. 90 tablet 3   hydrALAZINE (APRESOLINE) 100 MG tablet Take 1 tablet (100 mg total) by mouth 3 (three) times daily. (Patient taking differently: Take 100 mg by mouth 2 (two) times daily.) 270 tablet 3   hydrOXYzine (ATARAX/VISTARIL) 10 MG tablet 1-2 tab po q 8 hours as needed itching. 30 tablet 0   ipratropium (ATROVENT HFA) 17 MCG/ACT inhaler Inhale 2 puffs into the lungs  every 6 (six) hours as needed for wheezing. 1 Inhaler 3   ISOSORBIDE MONONITRATE PO Take 60 mg by mouth daily.     meclizine (ANTIVERT) 12.5 MG tablet Take 1 tablet (12.5 mg total) by mouth 3 (three) times daily as needed for dizziness. 30 tablet 0   metroNIDAZOLE (FLAGYL) 250 MG tablet Take 1 tablet (250 mg total) by mouth 3 (three) times daily. 30 tablet 0   olopatadine (PATANOL) 0.1 % ophthalmic solution Place 1 drop into both eyes 2 (two)  times daily. 5 mL 12   omeprazole (PRILOSEC) 40 MG capsule Take 1 capsule (40 mg total) by mouth daily. 90 capsule 3   ranitidine (ZANTAC) 150 MG capsule Take 1 capsule (150 mg total) by mouth 2 (two) times daily. (Patient taking differently: Take 150 mg by mouth as needed.) 60 capsule 6   sacubitril-valsartan (ENTRESTO) 49-51 MG Take 1 tablet by mouth 2 (two) times daily. 60 tablet 11   spironolactone (ALDACTONE) 25 MG tablet Take 1 tablet (25 mg total) by mouth daily. 90 tablet 3   terbinafine (LAMISIL) 250 MG tablet Take by mouth.     No current facility-administered medications on file prior to visit.    BP 130/60   Pulse 78   Temp 98.5 F (36.9 C) (Oral)   Ht 5\' 9"  (1.753 m)   Wt 197 lb 12.8 oz (89.7 kg)   SpO2 95%   BMI 29.21 kg/m        Objective:   Physical Exam  General Mental Status- Alert. General Appearance- Not in acute distress.   Skin General: Color- Normal Color. Moisture- Normal Moisture.  Neck Carotid Arteries- Normal color. Moisture- Normal Moisture. No carotid bruits. No JVD.  Chest and Lung Exam Auscultation: Breath Sounds:-Normal.  Cardiovascular Auscultation:Rythm- Regular. Murmurs & Other Heart Sounds:Auscultation of the heart reveals- No Murmurs.  Abdomen Inspection:-Inspeection Normal. Palpation/Percussion:Note:No mass. Palpation and Percussion of the abdomen reveal- Non Tender, Non Distended + BS, no rebound or guarding.   Neurologic Cranial Nerve exam:- CN III-XII intact(No nystagmus), symmetric  smile. Strength:- 5/5 equal and symmetric strength both upper and lower extremities.       Assessment & Plan:   Patient Instructions  Htn- bp is well controlled today with current meds. Continue.  Fatigue- will get tsh and t4.  For low libido overall and fatigue get testosterone panel.  For toe pain and elevated uric acid repeat uric acid level today. Discussed dilemma on treatment with heatlh hx chf and med interaction. Will rx tramadol low number brief course to help with pain as wait on repeat uric acid level. Also will go ahead and refer to rheumatologist.  Refer to neurologist for tremor evaluation.    Mackie Pai, PA-C   Time spent with patient today was 44  minutes which consisted of chart revdiew, discussing diagnoses, work up treatment and documentation.   Note did also place nutritionist referral at pt request. Dx overweight, gout and cardiomyopahty

## 2022-01-29 NOTE — Patient Instructions (Addendum)
Htn- bp is well controlled today with current meds. Continue.  Fatigue- will get tsh and t4.  For low libido overall and fatigue get testosterone panel.  For toe pain and elevated uric acid repeat uric acid level today. Discussed dilemma on treatment with heatlh hx chf and med interaction. Will rx tramadol low number brief course to help with pain as wait on repeat uric acid level. Also will go ahead and refer to rheumatologist.  Refer to neurologist for tremor evaluation.  Follow up date to be determined after lab review.  On review I saw that sports med MD did go ahead and rx prednisone and allopurinol. I had discussed this as possibility for pt but had not decided on that as of yet. Will see how pt does with treatment.

## 2022-01-30 LAB — TESTOSTERONE TOTAL,FREE,BIO, MALES
Albumin: 4.3 g/dL (ref 3.6–5.1)
Sex Hormone Binding: 35 nmol/L (ref 22–77)
Testosterone, Bioavailable: 97.4 ng/dL — ABNORMAL LOW (ref 110.0–575.0)
Testosterone, Free: 49.5 pg/mL (ref 46.0–224.0)
Testosterone: 393 ng/dL (ref 250–827)

## 2022-02-27 ENCOUNTER — Ambulatory Visit (INDEPENDENT_AMBULATORY_CARE_PROVIDER_SITE_OTHER): Payer: Medicare Other | Admitting: Family Medicine

## 2022-02-27 ENCOUNTER — Encounter: Payer: Self-pay | Admitting: Family Medicine

## 2022-02-27 VITALS — Ht 69.0 in | Wt 197.0 lb

## 2022-02-27 DIAGNOSIS — M170 Bilateral primary osteoarthritis of knee: Secondary | ICD-10-CM

## 2022-02-27 DIAGNOSIS — M19079 Primary osteoarthritis, unspecified ankle and foot: Secondary | ICD-10-CM | POA: Insufficient documentation

## 2022-02-27 MED ORDER — DICLOFENAC SODIUM 2 % EX SOLN: 2.0000 | Freq: Two times a day (BID) | CUTANEOUS | 2 refills | Status: DC

## 2022-02-27 NOTE — Patient Instructions (Signed)
Good to see you Please try the insoles  Please see if the rub on medicine if covered. If it isn't then we can try voltaren over the counter.   Please send me a message in MyChart with any questions or updates.  Please see me back as needed.   --Dr. Raeford Razor

## 2022-02-27 NOTE — Assessment & Plan Note (Signed)
Acute on chronic in nature. -Counseled on home exercise therapy and supportive care. - pennsaid

## 2022-02-27 NOTE — Progress Notes (Signed)
  Matthew Nixon - 61 y.o. male MRN [id number]  Date of birth: 12/18/1960  SUBJECTIVE:  Including CC & ROS.  No chief complaint on file.   Matthew Nixon is a 61 y.o. male that is following up for his left great toe pain.  The pain is worse with the first few steps.  He does not notice the pain with exercising.   Review of Systems See HPI   HISTORY: Past Medical, Surgical, Social, and Family History Reviewed & Updated per EMR.   Pertinent Historical Findings include:  Past Medical History:  Diagnosis Date   Allergy    CAD (coronary artery disease), native coronary artery    25-49% prox to mid RCA by coronary CTA 11/2020   Cardiomyopathy [facility])    DCM with EF 32% on echo 2014 and no ischemia on nuclear stress test.  EF improved to 40-45% by echo 02/2017   Chronic back pain    low back   Chronic systolic CHF (congestive heart failure), NYHA class 2 ([place]) 08/07/2016   COPD (chronic obstructive pulmonary disease) ([place])    Dyspnea    with exertion   EKG, abnormal    history of left bundle block since 2002 per pt   GERD (gastroesophageal reflux disease)    Headache    Hypertension    LBBB (left bundle branch block)    OSA on CPAP 08/07/2016   Oxygen deficiency    Pain    right side pain for many years   PTSD (post-traumatic stress disorder)    Sleep apnea     Past Surgical History:  Procedure Laterality Date   ANKLE SURGERY Right    CARDIAC CATHETERIZATION  yrs ago   ESOPHAGOGASTRODUODENOSCOPY (EGD) WITH PROPOFOL N/A 01/13/2017   Procedure: ESOPHAGOGASTRODUODENOSCOPY (EGD) WITH PROPOFOL;  Surgeon: Irene Shipper, MD;  Location: [facility];  Service: Endoscopy;  Laterality: N/A;   HAND SURGERY     Right   HAND SURGERY     Left carpel tunnel   THUMB ARTHROSCOPY Left      PHYSICAL EXAM:  VS: Ht 5\' 9"  (1.753 m)   Wt 197 lb (89.4 kg)   BMI 29.09 kg/m  Physical Exam Gen: NAD, alert, cooperative with exam, well-appearing MSK:  Neurovascularly intact        ASSESSMENT & PLAN:   Primary osteoarthritis of both knees Acute on chronic in nature. -Counseled on home exercise therapy and supportive care. - pennsaid    Arthritis of metatarsophalangeal (MTP) joint of great toe Doing well with the previous round of steroids.  Symptoms today seem more associated with the degenerative changes of the joint as opposed to a synovitis or inflammatory component. -Counseled on home exercise therapy and supportive care. -Green sport insoles with a first ray post. -Could consider custom orthotics.

## 2022-02-27 NOTE — Assessment & Plan Note (Signed)
Doing well with the previous round of steroids.  Symptoms today seem more associated with the degenerative changes of the joint as opposed to a synovitis or inflammatory component. -Counseled on home exercise therapy and supportive care. -Green sport insoles with a first ray post. -Could consider custom orthotics.

## 2022-04-09 ENCOUNTER — Other Ambulatory Visit: Payer: Self-pay | Admitting: Cardiology

## 2022-04-09 MED ORDER — ENTRESTO 49-51 MG PO TABS
1.0000 | ORAL_TABLET | Freq: Two times a day (BID) | ORAL | 0 refills | Status: DC
Start: 2022-04-09 — End: 2022-04-11

## 2022-04-11 ENCOUNTER — Telehealth: Payer: Self-pay | Admitting: Medical

## 2022-04-11 ENCOUNTER — Other Ambulatory Visit: Payer: Self-pay | Admitting: Cardiology

## 2022-04-11 MED ORDER — ENTRESTO 49-51 MG PO TABS: 1.0000 | ORAL_TABLET | Freq: Two times a day (BID) | ORAL | 0 refills | Status: DC

## 2022-04-11 NOTE — Telephone Encounter (Signed)
Rx sent 

## 2022-04-11 NOTE — Telephone Encounter (Signed)
Juliann Pulse with [facility] calling on behalf of patient to see if Matthew Nixon can refill his sacubitril-valsartan (ENTRESTO) 49-51 MG for 90 days. Juliann Pulse said he needs a vacation override. Advised Juliann Pulse that Delene Loll was prescribed by Fransico Him on 04/09/2022. She said that he needs an appt with their office but it's 3 months to get in for a visit. Patient is asking Percell Miller to fill the medication for him. Please call patient to advise if this is possible.

## 2022-08-15 ENCOUNTER — Other Ambulatory Visit: Payer: Self-pay | Admitting: Medical

## 2022-08-15 ENCOUNTER — Ambulatory Visit (INDEPENDENT_AMBULATORY_CARE_PROVIDER_SITE_OTHER): Payer: Medicare Other | Admitting: Medical

## 2022-08-15 ENCOUNTER — Encounter: Payer: Self-pay | Admitting: Medical

## 2022-08-15 VITALS — BP 150/80 | HR 80 | Temp 98.6°F | Resp 18 | Ht 69.0 in | Wt 196.6 lb

## 2022-08-15 DIAGNOSIS — I5022 Chronic systolic (congestive) heart failure: Secondary | ICD-10-CM

## 2022-08-15 DIAGNOSIS — K1379 Other lesions of oral mucosa: Secondary | ICD-10-CM | POA: Diagnosis not present

## 2022-08-15 DIAGNOSIS — I10 Essential (primary) hypertension: Secondary | ICD-10-CM

## 2022-08-15 DIAGNOSIS — R0981 Nasal congestion: Secondary | ICD-10-CM

## 2022-08-15 DIAGNOSIS — J342 Deviated nasal septum: Secondary | ICD-10-CM

## 2022-08-15 DIAGNOSIS — K122 Cellulitis and abscess of mouth: Secondary | ICD-10-CM | POA: Diagnosis not present

## 2022-08-15 DIAGNOSIS — J0100 Acute maxillary sinusitis, unspecified: Secondary | ICD-10-CM | POA: Diagnosis not present

## 2022-08-15 LAB — COMPREHENSIVE METABOLIC PANEL
ALT: 53 U/L (ref 0–53)
AST: 29 U/L (ref 0–37)
Albumin: 4.5 g/dL (ref 3.5–5.2)
Alkaline Phosphatase: 62 U/L (ref 39–117)
BUN: 17 mg/dL (ref 6–23)
CO2: 32 mEq/L (ref 19–32)
Calcium: 10.5 mg/dL (ref 8.4–10.5)
Chloride: 102 mEq/L (ref 96–112)
Creatinine, Ser: 1.1 mg/dL (ref 0.40–1.50)
GFR: 71.86 mL/min (ref >60.00)
Glucose, Bld: 117 mg/dL — ABNORMAL HIGH (ref 70–99)
Potassium: 4.9 mEq/L (ref 3.5–5.1)
Sodium: 141 mEq/L (ref 135–145)
Total Bilirubin: 0.6 mg/dL (ref 0.2–1.2)
Total Protein: 6.9 g/dL (ref 6.0–8.3)

## 2022-08-15 LAB — BRAIN NATRIURETIC PEPTIDE: Pro B Natriuretic peptide (BNP): 69 pg/mL (ref 0.0–100.0)

## 2022-08-15 MED ORDER — AMLODIPINE BESYLATE 10 MG PO TABS: 10.0000 mg | ORAL_TABLET | Freq: Every day | ORAL | 3 refills | Status: DC

## 2022-08-15 MED ORDER — FLUTICASONE PROPIONATE 50 MCG/ACT NA SUSP: 2.0000 | Freq: Every day | NASAL | 1 refills | Status: DC

## 2022-08-15 MED ORDER — SPIRONOLACTONE 25 MG PO TABS
25.0000 mg | ORAL_TABLET | Freq: Every day | ORAL | 3 refills | Status: DC
Start: 2022-08-15 — End: 2023-10-05

## 2022-08-15 MED ORDER — HYDRALAZINE HCL 100 MG PO TABS
100.0000 mg | ORAL_TABLET | Freq: Three times a day (TID) | ORAL | 3 refills | Status: DC
Start: 2022-08-15 — End: 2023-10-05

## 2022-08-15 MED ORDER — AMOXICILLIN-POT CLAVULANATE 875-125 MG PO TABS: 1.0000 | ORAL_TABLET | Freq: Two times a day (BID) | ORAL | 0 refills | Status: DC

## 2022-08-15 MED ORDER — ENTRESTO 49-51 MG PO TABS: 1.0000 | ORAL_TABLET | Freq: Two times a day (BID) | ORAL | 0 refills | Status: DC

## 2022-08-15 MED ORDER — CARVEDILOL 25 MG PO TABS: 25.0000 mg | ORAL_TABLET | Freq: Two times a day (BID) | ORAL | 3 refills | Status: DC

## 2022-08-15 NOTE — Progress Notes (Addendum)
Subjective:    Patient ID: Matthew Nixon, male    DOB: Nov 25, 1960, 62 y.o.   MRN: [id number]  HPI  Pt states since January he had sinus pressure, nasal congestion and runny nose. Middle of January he states left side nose bleed that intermittent and random for 3-4 day. No nose bleeding since then. Pt was in DR since October. He got back in January.    Pt recently every day he has some tissue in his mouth. He states since January he has appearance of skin flaking of his palate. He wakes up feeling tissue and if gargles will peel off. He brings sample of what he spit out.   Hx of sinus infection and deviated septum in the past he was scheduled to have deviated septum repair.  Pt was scheduled to have surgery with Dr. Annalee Genta.   [facility]  [address]  [address]  [place], [place] [address]  [phone]     Htn and chg pt in amlodipine 10 mg daily, coreg 25 mg bid, hydralazine 100 mg tid, spirinolactone 25 mg daily, entresto 49-51 bid and digoxin 0.125 mg daily. Pt has appointment with cardilologist Dr Mayford Knife in couple of months.  Bp today 150/80. No cardiac or neurologic signs/symptoms.     Review of Systems  Constitutional:  Negative for chills, fatigue and fever.  HENT:  Positive for congestion, sinus pressure and sinus pain. Negative for ear pain and facial swelling.        See hpi on discharge from palate area behind uvula.  Respiratory:  Negative for cough, chest tightness, shortness of breath and wheezing.   Cardiovascular:  Negative for chest pain and palpitations.  Gastrointestinal:  Negative for abdominal pain, blood in stool, constipation, nausea and rectal pain.  Genitourinary:  Negative for dysuria, flank pain and frequency.  Musculoskeletal:  Negative for back pain, joint swelling, neck pain and neck stiffness.  Skin:  Negative for rash.  Neurological:  Negative for dizziness, speech difficulty, weakness,  numbness and headaches.  Hematological:  Negative for adenopathy. Does not bruise/bleed easily.  Psychiatric/Behavioral:  Negative for behavioral problems, confusion and hallucinations. The patient is not nervous/anxious.     Past Medical History:  Diagnosis Date   Allergy    CAD (coronary artery disease), native coronary artery    25-49% prox to mid RCA by coronary CTA 11/2020   Cardiomyopathy [facility])    DCM with EF 32% on echo 2014 and no ischemia on nuclear stress test.  EF improved to 40-45% by echo 02/2017   Chronic back pain    low back   Chronic systolic CHF (congestive heart failure), NYHA class 2 (HCC) 08/07/2016   COPD (chronic obstructive pulmonary disease) (HCC)    Dyspnea    with exertion   EKG, abnormal    history of left bundle block since 2002 per pt   GERD (gastroesophageal reflux disease)    Headache    Hypertension    LBBB (left bundle branch block)    OSA on CPAP 08/07/2016   Oxygen deficiency    Pain    right side pain for many years   PTSD (post-traumatic stress disorder)    Sleep apnea      Social History   Socioeconomic History   Marital status: Married    Spouse name: Not on file   Number of children: Not on file   Years of education: Not on file   Highest education  level: Not on file  Occupational History   Not on file  Tobacco Use   Smoking status: Never   Smokeless tobacco: Never  Vaping Use   Vaping Use: Never used  Substance and Sexual Activity   Alcohol use: No   Drug use: No   Sexual activity: Not on file  Other Topics Concern   Not on file  Social History Narrative   2 years college    Social Determinants of Health   Financial Resource Strain: Not on file  Food Insecurity: Not on file  Transportation Needs: Not on file  Physical Activity: Not on file  Stress: Not on file  Social Connections: Not on file  Intimate Partner Violence: Not on file    Past Surgical History:  Procedure Laterality Date   ANKLE SURGERY Right     CARDIAC CATHETERIZATION  yrs ago   ESOPHAGOGASTRODUODENOSCOPY (EGD) WITH PROPOFOL N/A 01/13/2017   Procedure: ESOPHAGOGASTRODUODENOSCOPY (EGD) WITH PROPOFOL;  Surgeon: Hilarie Fredrickson, MD;  Location: [facility];  Service: Endoscopy;  Laterality: N/A;   HAND SURGERY     Right   HAND SURGERY     Left carpel tunnel   THUMB ARTHROSCOPY Left     Family History  Problem Relation Age of Onset   Heart disease Mother    Heart disease Father    Colon cancer Neg Hx    Esophageal cancer Neg Hx    Pancreatic cancer Neg Hx    Stomach cancer Neg Hx     No Known Allergies  Current Outpatient Medications on File Prior to Visit  Medication Sig Dispense Refill   albuterol (VENTOLIN HFA) 108 (90 Base) MCG/ACT inhaler Inhale 2 puffs into the lungs every 6 (six) hours as needed. 18 g 0   allopurinol (ZYLOPRIM) 100 MG tablet Take 1 tablet (100 mg total) by mouth daily. 30 tablet 3   aspirin 81 MG tablet Take 81 mg by mouth daily.     budesonide-formoterol (SYMBICORT) 160-4.5 MCG/ACT inhaler Inhale 2 puffs into the lungs 2 (two) times daily. 1 each 12   diclofenac Sodium (PENNSAID) 2 % SOLN Place 2 Pump (40 mg total) onto the skin 2 (two) times daily. 112 g 2   digoxin (LANOXIN) 0.125 MG tablet Take 1 tablet (0.125 mg total) by mouth daily. 90 tablet 3   doxazosin (CARDURA) 8 MG tablet Take 1 tablet (8 mg total) by mouth daily. 90 tablet 3   FLOVENT HFA 220 MCG/ACT inhaler Inhale into the lungs.     fluticasone (FLONASE) 50 MCG/ACT nasal spray Place 2 sprays into both nostrils daily. 16 g 1   furosemide (LASIX) 20 MG tablet Take 0.5 tablets (10 mg total) by mouth daily. 90 tablet 3   hydrOXYzine (ATARAX/VISTARIL) 10 MG tablet 1-2 tab po q 8 hours as needed itching. 30 tablet 0   ipratropium (ATROVENT HFA) 17 MCG/ACT inhaler Inhale 2 puffs into the lungs every 6 (six) hours as needed for wheezing. 1 Inhaler 3   ISOSORBIDE MONONITRATE PO Take 60 mg by mouth daily.     meclizine (ANTIVERT) 12.5 MG tablet  Take 1 tablet (12.5 mg total) by mouth 3 (three) times daily as needed for dizziness. 30 tablet 0   metroNIDAZOLE (FLAGYL) 250 MG tablet Take 1 tablet (250 mg total) by mouth 3 (three) times daily. 30 tablet 0   olopatadine (PATANOL) 0.1 % ophthalmic solution Place 1 drop into both eyes 2 (two) times daily. 5 mL 12   omeprazole (PRILOSEC) 40 MG  capsule Take 1 capsule (40 mg total) by mouth daily. 90 capsule 3   predniSONE (DELTASONE) 10 MG tablet Take 1 tablet (10 mg total) by mouth daily with breakfast. 7 tablet 0   ranitidine (ZANTAC) 150 MG capsule Take 1 capsule (150 mg total) by mouth 2 (two) times daily. (Patient taking differently: Take 150 mg by mouth as needed.) 60 capsule 6   terbinafine (LAMISIL) 250 MG tablet Take by mouth.     No current facility-administered medications on file prior to visit.    BP (!) 150/80   Pulse 80   Temp 98.6 F (37 C)   Resp 18   Ht 5\' 9"  (1.753 m)   Wt 196 lb 9.6 oz (89.2 kg)   SpO2 96%   BMI 29.03 kg/m        Objective:   Physical Exam  General Mental Status- Alert. General Appearance- Not in acute distress.   Skin General: Color- Normal Color. Moisture- Normal Moisture.  Neck Carotid Arteries- Normal color. Moisture- Normal Moisture. No carotid bruits. No JVD.  Chest and Lung Exam Auscultation: Breath Sounds:-Normal.  Cardiovascular Auscultation:Rythm- Regular. Murmurs & Other Heart Sounds:Auscultation of the heart reveals- No Murmurs.  Abdomen Inspection:-Inspeection Normal. Palpation/Percussion:Note:No mass. Palpation and Percussion of the abdomen reveal- Non Tender, Non Distended + BS, no rebound or guarding.   Neurologic Cranial Nerve exam:- CN III-XII intact(No nystagmus), symmetric smile. Strength:- 5/5 equal and symmetric strength both upper and lower extremities.   Heent- mild maxillary sinus pressure. Septum deviated to the left. Canals clear and normal tms.   Lower extremity- no pedal edema. Negative homans  signs. Calfs symmetric.      Assessment & Plan:   Patient Instructions  1. Deviated septum, Nasal congestion and Subacute maxillary sinusitis Rx augmentin antibiotic and flonase nasal spray. Place referral to ENT as you may need biopsy of area behind uvula.  - Ambulatory referral to ENT(dr. Annalee Genta)  [facility]  [address]  [address]  [place], [place] [address]  [phone]    Ask you call ENT office by wed next week if they don't call you.  Decided to sent out tissue you brought in today. Willl follow pathology report.  2. Chronic systolic congestive heart failure (HCC)  and Essential hypertension REfilled meds today. Please make sure you follow up with cardiologist within next 2-3 months. - Comp Met (CMET) - B Nat Peptide  3. Lesion of hard palate. With your describes daily repetitive clearing of tissue from palate decided to send tissue out for pathology study.If significant finding then expidite referral to ENT.   Follow up with me June or July before you head back to DR. Sooner if needed.     Esperanza Richters, PA-C   Time spent with patient today was 42  minutes which consisted of chart revdiew, discussing diagnoses, work up treatment and documentation.

## 2022-08-15 NOTE — Patient Instructions (Addendum)
1. Deviated septum, Nasal congestion and Subacute maxillary sinusitis Rx augmentin antibiotic and flonase nasal spray. Place referral to ENT as you may need biopsy of area behind uvula.  - Ambulatory referral to ENT(dr. Wilburn Cornelia)  [facility] [address]  [place], [place] [address]  [phone]    Ask you call ENT office by wed next week if they don't call you.  Decided to sent out tissue you brought in today. Willl follow pathology report.  2. Chronic systolic congestive heart failure ([place])  and Essential hypertension REfilled meds today. Please make sure you follow up with cardiologist within next 2-3 months. - Comp Met (CMET) - B Nat Peptide  3. Lesion of hard palate. With your describes daily repetitive clearing of tissue from palate decided to send tissue out for pathology study.If significant finding then expidite referral to ENT.   Follow up with me June or July before you head back to DR. Sooner if needed.

## 2022-08-15 NOTE — Addendum Note (Signed)
Addended by: Jeronimo Greaves on: 08/15/2022 10:25 AM   Modules accepted: Orders

## 2022-10-01 ENCOUNTER — Encounter: Payer: Self-pay | Admitting: Medical

## 2022-10-01 ENCOUNTER — Ambulatory Visit (INDEPENDENT_AMBULATORY_CARE_PROVIDER_SITE_OTHER): Payer: Medicare Other | Admitting: Medical

## 2022-10-01 VITALS — BP 148/85 | HR 77 | Resp 18 | Ht 69.0 in | Wt 198.2 lb

## 2022-10-01 DIAGNOSIS — M5440 Lumbago with sciatica, unspecified side: Secondary | ICD-10-CM

## 2022-10-01 DIAGNOSIS — M5432 Sciatica, left side: Secondary | ICD-10-CM

## 2022-10-01 DIAGNOSIS — I10 Essential (primary) hypertension: Secondary | ICD-10-CM

## 2022-10-01 MED ORDER — KETOROLAC TROMETHAMINE 30 MG/ML IJ SOLN
30.0000 mg | Freq: Once | INTRAMUSCULAR | Status: AC
Start: 2022-10-01 — End: 2022-10-01
  Administered 2022-10-01: 30 mg via INTRAMUSCULAR

## 2022-10-01 NOTE — Addendum Note (Signed)
Addended by: Thelma Barge D on: 10/01/2022 12:52 PM   Modules accepted: Orders

## 2022-10-01 NOTE — Patient Instructions (Addendum)
For left side sciatica we gave you toradol 30 mg IM injection today. Back exercises as tolerated.  Use salon pas lidocaine patch to area and use tylenol for pain.  With med history htn and chf avoid daily nsaid and will only use steroid if benefit exceeds risk.  Lumbar xray today based on chronic intermittent lumbar back pain.  Htn and chf stable clinically and bp in normal mild elevated range. Continue current cardiac meds.   Follow up 14 days or sooner if needed.  Back Exercises The following exercises strengthen the muscles that help to support the trunk (torso) and back. They also help to keep the lower back flexible. Doing these exercises can help to prevent or lessen existing low back pain. If you have back pain or discomfort, try doing these exercises 2-3 times each day or as told by your health care provider. As your pain improves, do them once each day, but increase the number of times that you repeat the steps for each exercise (do more repetitions). To prevent the recurrence of back pain, continue to do these exercises once each day or as told by your health care provider. Do exercises exactly as told by your health care provider and adjust them as directed. It is normal to feel mild stretching, pulling, tightness, or discomfort as you do these exercises, but you should stop right away if you feel sudden pain or your pain gets worse. Exercises Single knee to chest Repeat these steps 3-5 times for each leg: Lie on your back on a firm bed or the floor with your legs extended. Bring one knee to your chest. Your other leg should stay extended and in contact with the floor. Hold your knee in place by grabbing your knee or thigh with both hands and hold. Pull on your knee until you feel a gentle stretch in your lower back or buttocks. Hold the stretch for 10-30 seconds. Slowly release and straighten your leg.  Pelvic tilt Repeat these steps 5-10 times: Lie on your back on a firm  bed or the floor with your legs extended. Bend your knees so they are pointing toward the ceiling and your feet are flat on the floor. Tighten your lower abdominal muscles to press your lower back against the floor. This motion will tilt your pelvis so your tailbone points up toward the ceiling instead of pointing to your feet or the floor. With gentle tension and even breathing, hold this position for 5-10 seconds.  Cat-cow Repeat these steps until your lower back becomes more flexible: Get into a hands-and-knees position on a firm bed or the floor. Keep your hands under your shoulders, and keep your knees under your hips. You may place padding under your knees for comfort. Let your head hang down toward your chest. Contract your abdominal muscles and point your tailbone toward the floor so your lower back becomes rounded like the back of a cat. Hold this position for 5 seconds. Slowly lift your head, let your abdominal muscles relax, and point your tailbone up toward the ceiling so your back forms a sagging arch like the back of a cow. Hold this position for 5 seconds.  Press-ups Repeat these steps 5-10 times: Lie on your abdomen (face-down) on a firm bed or the floor. Place your palms near your head, about shoulder-width apart. Keeping your back as relaxed as possible and keeping your hips on the floor, slowly straighten your arms to raise the top half of your body and  lift your shoulders. Do not use your back muscles to raise your upper torso. You may adjust the placement of your hands to make yourself more comfortable. Hold this position for 5 seconds while you keep your back relaxed. Slowly return to lying flat on the floor.  Bridges Repeat these steps 10 times: Lie on your back on a firm bed or the floor. Bend your knees so they are pointing toward the ceiling and your feet are flat on the floor. Your arms should be flat at your sides, next to your body. Tighten your buttocks muscles  and lift your buttocks off the floor until your waist is at almost the same height as your knees. You should feel the muscles working in your buttocks and the back of your thighs. If you do not feel these muscles, slide your feet 1-2 inches (2.5-5 cm) farther away from your buttocks. Hold this position for 3-5 seconds. Slowly lower your hips to the starting position, and allow your buttocks muscles to relax completely. If this exercise is too easy, try doing it with your arms crossed over your chest. Abdominal crunches Repeat these steps 5-10 times: Lie on your back on a firm bed or the floor with your legs extended. Bend your knees so they are pointing toward the ceiling and your feet are flat on the floor. Cross your arms over your chest. Tip your chin slightly toward your chest without bending your neck. Tighten your abdominal muscles and slowly raise your torso high enough to lift your shoulder blades a tiny bit off the floor. Avoid raising your torso higher than that because it can put too much stress on your lower back and does not help to strengthen your abdominal muscles. Slowly return to your starting position.  Back lifts Repeat these steps 5-10 times: Lie on your abdomen (face-down) with your arms at your sides, and rest your forehead on the floor. Tighten the muscles in your legs and your buttocks. Slowly lift your chest off the floor while you keep your hips pressed to the floor. Keep the back of your head in line with the curve in your back. Your eyes should be looking at the floor. Hold this position for 3-5 seconds. Slowly return to your starting position.  Contact a health care provider if: Your back pain or discomfort gets much worse when you do an exercise. Your worsening back pain or discomfort does not lessen within 2 hours after you exercise. If you have any of these problems, stop doing these exercises right away. Do not do them again unless your health care provider  says that you can. Get help right away if: You develop sudden, severe back pain. If this happens, stop doing the exercises right away. Do not do them again unless your health care provider says that you can. This information is not intended to replace advice given to you by your health care provider. Make sure you discuss any questions you have with your health care provider. Document Revised: 12/04/2020 Document Reviewed: 08/22/2020 Elsevier Patient Education  2023 [employer].
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
## 2022-10-02 ENCOUNTER — Ambulatory Visit (HOSPITAL_BASED_OUTPATIENT_CLINIC_OR_DEPARTMENT_OTHER)
Admission: RE | Admit: 2022-10-02 | Discharge: 2022-10-02 | Disposition: A | Discharge status: Home / Self Care | Payer: Medicare Other | Source: Ambulatory Visit | Attending: Medical | Admitting: Medical

## 2022-10-02 DIAGNOSIS — M5432 Sciatica, left side: Secondary | ICD-10-CM | POA: Insufficient documentation

## 2022-10-02 DIAGNOSIS — M47816 Spondylosis without myelopathy or radiculopathy, lumbar region: Secondary | ICD-10-CM | POA: Diagnosis not present

## 2022-10-02 DIAGNOSIS — M5440 Lumbago with sciatica, unspecified side: Secondary | ICD-10-CM | POA: Insufficient documentation

## 2022-10-08 ENCOUNTER — Encounter: Payer: Self-pay | Admitting: *Deleted

## 2022-10-24 ENCOUNTER — Other Ambulatory Visit: Payer: Self-pay | Admitting: Medical

## 2022-11-05 ENCOUNTER — Ambulatory Visit: Payer: Medicare Other | Attending: Cardiology | Admitting: Cardiology

## 2022-11-05 ENCOUNTER — Encounter: Payer: Self-pay | Admitting: Cardiology

## 2022-11-05 VITALS — BP 162/82 | HR 77 | Ht 69.0 in | Wt 201.0 lb

## 2022-11-05 DIAGNOSIS — Z79899 Other long term (current) drug therapy: Secondary | ICD-10-CM | POA: Insufficient documentation

## 2022-11-05 DIAGNOSIS — G4733 Obstructive sleep apnea (adult) (pediatric): Secondary | ICD-10-CM | POA: Diagnosis not present

## 2022-11-05 DIAGNOSIS — R079 Chest pain, unspecified: Secondary | ICD-10-CM | POA: Diagnosis not present

## 2022-11-05 DIAGNOSIS — I428 Other cardiomyopathies: Secondary | ICD-10-CM | POA: Insufficient documentation

## 2022-11-05 DIAGNOSIS — I5022 Chronic systolic (congestive) heart failure: Secondary | ICD-10-CM | POA: Insufficient documentation

## 2022-11-05 DIAGNOSIS — I10 Essential (primary) hypertension: Secondary | ICD-10-CM | POA: Diagnosis not present

## 2022-11-05 LAB — LIPID PANEL
Chol/HDL Ratio: 4.6 ratio (ref 0.0–5.0)
Cholesterol, Total: 166 mg/dL (ref 100–199)
HDL: 36 mg/dL — ABNORMAL LOW (ref >39)
LDL Chol Calc (NIH): 97 mg/dL (ref 0–99)
Triglycerides: 192 mg/dL — ABNORMAL HIGH (ref 0–149)
VLDL Cholesterol Cal: 33 mg/dL (ref 5–40)

## 2022-11-05 LAB — ALT: ALT: 47 IU/L — ABNORMAL HIGH (ref 0–44)

## 2022-11-05 NOTE — Progress Notes (Signed)
Date:  11/05/2022   ID:  Matthew Nixon, DOB 09/20/1960, MRN [id number]   PCP:  Esperanza Richters, PA-C  Cardiologist:  Armanda Magic, MD Electrophysiologist:  None   Chief Complaint:  DCM, CHF, HTN, OSA  History of Present Illness:    Matthew Nixon is a 62 y.o. male with a hx of DCM with EF 32% (echo 2014) and repeat echo 02/2017 with improved LVF with EF 40-45% and no ischemia on nuclear stress, chronic systolic heart failure NYHA class II, COPD, hypertension, left bundle branch block and obstructive sleep apnea.  His last PSG showed mild obstructive sleep apnea with an overall AHI of 7.6/h with severe sleep apnea during REM sleep with an AHI 27.7/h.  Oxygen desaturations dropped as low as 86%.  He was placed on  CPAP at 9cm H2O but was intolerant and was not interested in CPAP.    He had an echo in 2022 showing EF 30-35% with mild LVE, mild RV dysfunction and coronary CTA 6/22 showed minimal  non obstructive CAD with coronary calcium score of 16 of LCx.  He is here today for followup and is doing well.  He tells me that everytime he tries to walk he gets a tinge of sharp pain and lasts for 3-4 seconds and resolves. There is no radiation of the pain and no problems with SOB, diaphoresis, nausea.  He takes a deep breath and keeps walking and it resolves.  He has chronic SOB from COPD that is stable.  He denies any PND, orthopnea, LE edema, dizziness, palpitations or syncope. He is compliant with his meds and is tolerating meds with no SE.     Prior CV studies:   The following studies were reviewed today:  EKG  Past Medical History:  Diagnosis Date   Allergy    CAD (coronary artery disease), native coronary artery    25-49% prox to mid RCA by coronary CTA 11/2020   Cardiomyopathy [facility])    DCM with EF 32% on echo 2014 and no ischemia on nuclear stress test.  EF improved to 40-45% by echo 02/2017   Chronic back pain    low back   Chronic systolic CHF (congestive heart failure), NYHA  class 2 (HCC) 08/07/2016   COPD (chronic obstructive pulmonary disease) (HCC)    Dyspnea    with exertion   EKG, abnormal    history of left bundle block since 2002 per pt   GERD (gastroesophageal reflux disease)    Headache    Hypertension    LBBB (left bundle branch block)    OSA on CPAP 08/07/2016   Oxygen deficiency    Pain    right side pain for many years   PTSD (post-traumatic stress disorder)    Sleep apnea    Past Surgical History:  Procedure Laterality Date   ANKLE SURGERY Right    CARDIAC CATHETERIZATION  yrs ago   ESOPHAGOGASTRODUODENOSCOPY (EGD) WITH PROPOFOL N/A 01/13/2017   Procedure: ESOPHAGOGASTRODUODENOSCOPY (EGD) WITH PROPOFOL;  Surgeon: Hilarie Fredrickson, MD;  Location: [facility] ENDOSCOPY;  Service: Endoscopy;  Laterality: N/A;   HAND SURGERY     Right   HAND SURGERY     Left carpel tunnel   THUMB ARTHROSCOPY Left      Current Meds  Medication Sig   albuterol (VENTOLIN HFA) 108 (90 Base) MCG/ACT inhaler Inhale 2 puffs into the lungs every 6 (six) hours as needed.   allopurinol (ZYLOPRIM) 100 MG tablet Take 1 tablet (100 mg total) by mouth  daily.   amLODipine (NORVASC) 10 MG tablet Take 1 tablet (10 mg total) by mouth daily.   amoxicillin-clavulanate (AUGMENTIN) 875-125 MG tablet Take 1 tablet by mouth 2 (two) times daily.   aspirin 81 MG tablet Take 81 mg by mouth daily.   budesonide-formoterol (SYMBICORT) 160-4.5 MCG/ACT inhaler Inhale 2 puffs into the lungs 2 (two) times daily.   carvedilol (COREG) 25 MG tablet Take 1 tablet (25 mg total) by mouth 2 (two) times daily with a meal.   diclofenac Sodium (PENNSAID) 2 % SOLN Place 2 Pump (40 mg total) onto the skin 2 (two) times daily.   digoxin (LANOXIN) 0.125 MG tablet Take 1 tablet (0.125 mg total) by mouth daily.   doxazosin (CARDURA) 8 MG tablet Take 1 tablet (8 mg total) by mouth daily.   FLOVENT HFA 220 MCG/ACT inhaler Inhale into the lungs.   fluticasone (FLONASE) 50 MCG/ACT nasal spray Place 2 sprays into both  nostrils daily.   fluticasone (FLONASE) 50 MCG/ACT nasal spray Place 2 sprays into both nostrils daily.   furosemide (LASIX) 20 MG tablet Take 0.5 tablets (10 mg total) by mouth daily.   hydrALAZINE (APRESOLINE) 100 MG tablet Take 1 tablet (100 mg total) by mouth 3 (three) times daily.   hydrOXYzine (ATARAX/VISTARIL) 10 MG tablet 1-2 tab po q 8 hours as needed itching.   ipratropium (ATROVENT HFA) 17 MCG/ACT inhaler Inhale 2 puffs into the lungs every 6 (six) hours as needed for wheezing.   ISOSORBIDE MONONITRATE PO Take 60 mg by mouth daily.   meclizine (ANTIVERT) 12.5 MG tablet Take 1 tablet (12.5 mg total) by mouth 3 (three) times daily as needed for dizziness.   olopatadine (PATANOL) 0.1 % ophthalmic solution Place 1 drop into both eyes 2 (two) times daily.   omeprazole (PRILOSEC) 40 MG capsule Take 1 capsule (40 mg total) by mouth daily.   predniSONE (DELTASONE) 10 MG tablet Take 1 tablet (10 mg total) by mouth daily with breakfast.   ranitidine (ZANTAC) 150 MG capsule Take 1 capsule (150 mg total) by mouth 2 (two) times daily. (Patient taking differently: Take 150 mg by mouth as needed.)   sacubitril-valsartan (ENTRESTO) 49-51 MG Take 1 tablet by mouth 2 (two) times daily.   spironolactone (ALDACTONE) 25 MG tablet Take 1 tablet (25 mg total) by mouth daily.   terbinafine (LAMISIL) 250 MG tablet Take by mouth.     Allergies:   Patient has no known allergies.   Social History   Tobacco Use   Smoking status: Never   Smokeless tobacco: Never  Vaping Use   Vaping Use: Never used  Substance Use Topics   Alcohol use: No   Drug use: No     Family Hx: The patient's family history includes Heart disease in his father and mother. There is no history of Colon cancer, Esophageal cancer, Pancreatic cancer, or Stomach cancer.  ROS:   Please see the history of present illness.     All other systems reviewed and are negative.   Labs/Other Tests and Data Reviewed:    Recent  Labs: 12/19/2021: Hemoglobin 16.7; Platelets 232.0 01/29/2022: TSH 1.87 08/15/2022: ALT 53; BUN 17; Creatinine, Ser 1.10; Potassium 4.9; Pro B Natriuretic peptide (BNP) 69.0; Sodium 141   Recent Lipid Panel Lab Results  Component Value Date/Time   CHOL 159 03/18/2016 07:08 AM   TRIG 118.0 03/18/2016 07:08 AM   HDL 38.90 (L) 03/18/2016 07:08 AM   CHOLHDL 4 03/18/2016 07:08 AM   LDLCALC 96 03/18/2016 07:08  AM    Wt Readings from Last 3 Encounters:  11/05/22 201 lb (91.2 kg)  10/01/22 198 lb 3.2 oz (89.9 kg)  08/15/22 196 lb 9.6 oz (89.2 kg)     Objective:    Vital Signs:  BP (!) 162/82   Pulse 77   Ht 5\' 9"  (1.753 m)   Wt 201 lb (91.2 kg)   SpO2 98%   BMI 29.68 kg/m   GEN: Well nourished, well developed in no acute distress HEENT: Normal NECK: No JVD; No carotid bruits LYMPHATICS: No lymphadenopathy CARDIAC:RRR, no murmurs, rubs, gallops RESPIRATORY:  Clear to auscultation without rales, wheezing or rhonchi  ABDOMEN: Soft, non-tender, non-distended MUSCULOSKELETAL:  No edema; No deformity  SKIN: Warm and dry NEUROLOGIC:  Alert and oriented x 3 PSYCHIATRIC:  Normal affect   EKG performed today demonstrates NSR with LBBB ASSESSMENT & PLAN:    1.  DCM -prior nuclear stress test with no ischemia -echo 2018 with EF 40-45% and decreased to 30-35% on echo 03/2019 -echo 2022 with EF 30-35% with mild LVE, mild RV dysfunction -coronary CTA 6/22 showed minimal  non obstructive CAD with coronary calcium score of 16 of LCx -likely HTN DCM - nuclear stress test in [place] in 2020  with no ischemia -Continue prescription drug management with Carvedilol 25mg  BID, spiro 25mg  daily, Entresto 24-26mg  BID, Hydralazine 100mg  TID, Imdur 60mg  daily and Lasix 20mg  daily  2.  Chronic systolic CHF NYHA class IIb -he appears euvolemic on exam today -weight remains stable -continue prescription drug management with Digoxin 0.125mg  daily, Carvedilol 25mg  BID, Lasix 10mg  daily, Hydralazine 100mg  TID,  Imdur 60mg  daily, Entresto 49-15mg  BID, spiro 25mg  daily with PRN refills -I have personally reviewed and interpreted outside labs performed by patient's PCP which showed serum creatinine 1.1 and potassium 4.9 on 08/15/2022  3.  HTN -BP controlled on exam today -Continue prescription drug management with amlodipine 10 mg daily, carvedilol 25 mg twice daily, doxazosin 8 mg daily, hydralazine 100 mg 3 times daily, Imdur 60 mg daily and spironolactone 25 mg daily and Entresto 49-51 mg twice daily with as needed refills  4.  OSA  -intolerant to CPAP -he is waking up gasping for breath -we discussed the Inspire device but he is not interested  5.  ASCAD -coronary CTA 6/22 showed minimal  non obstructive CAD with coronary calcium score of 16 of LCx -he has some chest pain but sounds atypical but only occurs when he exerts himself -check FLP and ALT -will get Stress PET CT to rule out ischemia -Shared Decision Making/Informed Consent The risks [chest pain, shortness of breath, cardiac arrhythmias, dizziness, blood pressure fluctuations, myocardial infarction, stroke/transient ischemic attack, nausea, vomiting, allergic reaction, radiation exposure, metallic taste sensation and life-threatening complications (estimated to be 1 in 10,000)], benefits (risk stratification, diagnosing coronary artery disease, treatment guidance) and alternatives of a cardiac PET stress test were discussed in detail with Mr. Umbaugh and he agrees to proceed.  Medication Adjustments/Labs and Tests Ordered: Current medicines are reviewed at length with the patient today.  Concerns regarding medicines are outlined above.  Tests Ordered: No orders of the defined types were placed in this encounter.  Medication Changes: No orders of the defined types were placed in this encounter.   Disposition:  Follow up with me in 1 year  Signed, Armanda Magic, MD  11/05/2022 10:50 AM    [facility]

## 2022-11-05 NOTE — Patient Instructions (Addendum)
Medication Instructions:  Your physician recommends that you continue on your current medications as directed. Please refer to the Current Medication list given to you today.  *If you need a refill on your cardiac medications before your next appointment, please call your pharmacy*   Lab Work: Please complete a fasting lipid panel and an ALT in our lab before you leave today.  If you have labs (blood work) drawn today and your tests are completely normal, you will receive your results only by: MyChart Message (if you have MyChart) OR A paper copy in the mail If you have any lab test that is abnormal or we need to change your treatment, we will call you to review the results.   Testing/Procedures: How to Prepare for Your Cardiac PET/CT Stress Test:  1. Please do not take these medications before your test:   Medications that may interfere with the cardiac pharmacological stress agent (ex. nitrates - including erectile dysfunction medications, isosorbide mononitrate, carvedilol, tamulosin or beta-blockers) the day of the exam. (Erectile dysfunction medication should be held for at least 72 hrs prior to test) Theophylline containing medications for 12 hours. Dipyridamole 48 hours prior to the test. Your remaining medications may be taken with water.  2. Nothing to eat or drink, except water, 3 hours prior to arrival time.   NO caffeine/decaffeinated products, or chocolate 12 hours prior to arrival.  3. NO perfume, cologne or lotion  4. Total time is 1 to 2 hours; you may want to bring reading material for the waiting time.  5. Please report to Radiology at the [facility] Main Entrance 30 minutes early for your test.  [address] [place], [place] [address]   IF YOU THINK YOU MAY BE PREGNANT, OR ARE NURSING PLEASE INFORM THE TECHNOLOGIST.  In preparation for your appointment, medication and supplies will be purchased.  Appointment availability is limited, so if you  need to cancel or reschedule, please call the Radiology Department at [phone]  24 hours in advance to avoid a cancellation fee of $100.00  What to Expect After you Arrive:  Once you arrive and check in for your appointment, you will be taken to a preparation room within the Radiology Department.  A technologist or Nurse will obtain your medical history, verify that you are correctly prepped for the exam, and explain the procedure.  Afterwards,  an IV will be started in your arm and electrodes will be placed on your skin for EKG monitoring during the stress portion of the exam. Then you will be escorted to the PET/CT scanner.  There, staff will get you positioned on the scanner and obtain a blood pressure and EKG.  During the exam, you will continue to be connected to the EKG and blood pressure machines.  A small, safe amount of a radioactive tracer will be injected in your IV to obtain a series of pictures of your heart along with an injection of a stress agent.    After your Exam:  It is recommended that you eat a meal and drink a caffeinated beverage to counter act any effects of the stress agent.  Drink plenty of fluids for the remainder of the day and urinate frequently for the first couple of hours after the exam.  Your doctor will inform you of your test results within 7-10 business days.  For questions about your test or how to prepare for your test, please call: Rockwell Alexandria, Cardiac Imaging Nurse Navigator  Larey Brick,  Cardiac Imaging Nurse Navigator Office: [phone]    Follow-Up: At [facility], you and your health needs are our priority.  As part of our continuing mission to provide you with exceptional heart care, we have created designated Provider Care Teams.  These Care Teams include your primary Cardiologist (physician) and Advanced Practice Providers (APPs -  Physician Assistants and Nurse Practitioners) who all work together to provide you with the care you  need, when you need it.  We recommend signing up for the patient portal called "MyChart".  Sign up information is provided on this After Visit Summary.  MyChart is used to connect with patients for Virtual Visits (Telemedicine).  Patients are able to view lab/test results, encounter notes, upcoming appointments, etc.  Non-urgent messages can be sent to your provider as well.   To learn more about what you can do with MyChart, go to [email or web address].    Your next appointment:   1 year(s)  Provider:   Armanda Magic, MD

## 2022-11-05 NOTE — Addendum Note (Signed)
Addended by: Luellen Pucker on: 11/05/2022 11:19 AM   Modules accepted: Orders

## 2022-11-06 ENCOUNTER — Telehealth: Payer: Self-pay

## 2022-11-06 DIAGNOSIS — Z79899 Other long term (current) drug therapy: Secondary | ICD-10-CM

## 2022-11-06 MED ORDER — ROSUVASTATIN CALCIUM 5 MG PO TABS: 5.0000 mg | ORAL_TABLET | Freq: Every day | ORAL | 3 refills | Status: DC

## 2022-11-06 NOTE — Telephone Encounter (Signed)
Spoke with patient regarding elevated LDL not at goal of <70, Dr. Mayford Knife recommends start crestor 5 mg daily and repeat labs in 6 weeks. Patient verbalizes understanding, agrees to report any side effects such as joint aches or muscle aches. Labs scheduled for 12/05/22 as patient will be leaving the state indefinitely to care for a family member at the end of June.

## 2022-12-01 ENCOUNTER — Telehealth (HOSPITAL_COMMUNITY): Payer: Self-pay | Admitting: Emergency Medicine

## 2022-12-01 NOTE — Telephone Encounter (Signed)
Attempted to call patient regarding upcoming cardiac PET appointment. Left message on voicemail with name and callback number Rodgerick Gilliand RN Navigator Cardiac Imaging [facility] [phone] Office [phone] Cell  

## 2022-12-01 NOTE — Telephone Encounter (Signed)
Reaching out to patient to offer assistance regarding upcoming cardiac imaging study; pt verbalizes understanding of appt date/time, parking situation and where to check in, pre-test NPO status and medications ordered, and verified current allergies; name and call back number provided for further questions should they arise Laguana Desautel RN Navigator Cardiac Imaging [facility] [phone] office [phone] cell 

## 2022-12-02 ENCOUNTER — Encounter (HOSPITAL_COMMUNITY): Payer: Self-pay

## 2022-12-02 ENCOUNTER — Encounter (HOSPITAL_COMMUNITY)
Admission: RE | Admit: 2022-12-02 | Discharge: 2022-12-02 | Disposition: A | Discharge status: Home / Self Care | Payer: Medicare Other | Source: Ambulatory Visit | Attending: Cardiology | Admitting: Cardiology

## 2022-12-02 DIAGNOSIS — I428 Other cardiomyopathies: Secondary | ICD-10-CM | POA: Insufficient documentation

## 2022-12-02 MED ORDER — REGADENOSON 0.4 MG/5ML IV SOLN: 0.4000 mg | Freq: Once | INTRAVENOUS | Status: DC

## 2022-12-02 MED ORDER — REGADENOSON 0.4 MG/5ML IV SOLN
INTRAVENOUS | Status: AC
  Filled 2022-12-02: qty 5

## 2022-12-05 ENCOUNTER — Ambulatory Visit: Payer: Medicare Other | Attending: Cardiology

## 2022-12-05 DIAGNOSIS — Z79899 Other long term (current) drug therapy: Secondary | ICD-10-CM

## 2022-12-06 LAB — LIPID PANEL
Chol/HDL Ratio: 3.3 ratio (ref 0.0–5.0)
Cholesterol, Total: 131 mg/dL (ref 100–199)
HDL: 40 mg/dL (ref >39)
LDL Chol Calc (NIH): 67 mg/dL (ref 0–99)
Triglycerides: 138 mg/dL (ref 0–149)
VLDL Cholesterol Cal: 24 mg/dL (ref 5–40)

## 2022-12-06 LAB — ALT: ALT: 32 IU/L (ref 0–44)

## 2022-12-15 ENCOUNTER — Telehealth: Payer: Self-pay

## 2022-12-15 NOTE — Telephone Encounter (Signed)
Reviewed normal labs with patient who verbalized understanding.

## 2022-12-15 NOTE — Telephone Encounter (Signed)
-----   Message from Traci R Turner, MD sent at 12/07/2022 12:36 PM EDT ----- Please let patient know that labs were normal.  Continue current medical therapy. 

## 2022-12-28 ENCOUNTER — Other Ambulatory Visit: Payer: Self-pay | Admitting: Medical

## 2023-01-20 ENCOUNTER — Other Ambulatory Visit: Payer: Self-pay | Admitting: Family

## 2023-01-20 ENCOUNTER — Telehealth: Payer: Self-pay | Admitting: Medical

## 2023-01-20 MED ORDER — BUDESONIDE-FORMOTEROL FUMARATE 160-4.5 MCG/ACT IN AERO: 2.0000 | INHALATION_SPRAY | Freq: Two times a day (BID) | RESPIRATORY_TRACT | 12 refills | Status: AC

## 2023-01-20 MED ORDER — ALBUTEROL SULFATE HFA 108 (90 BASE) MCG/ACT IN AERS: 2.0000 | INHALATION_SPRAY | Freq: Four times a day (QID) | RESPIRATORY_TRACT | 0 refills | Status: DC | PRN

## 2023-01-20 NOTE — Telephone Encounter (Signed)
Pt came in requesting a  refill of rx for his inhaler. Pt asked it be sent to the [facility] on [address].

## 2023-01-21 ENCOUNTER — Other Ambulatory Visit: Payer: Self-pay | Admitting: Family

## 2023-01-21 NOTE — Telephone Encounter (Signed)
Spoke with pt and he was unsure so sent both

## 2023-01-26 ENCOUNTER — Ambulatory Visit (INDEPENDENT_AMBULATORY_CARE_PROVIDER_SITE_OTHER): Payer: Medicare Other | Admitting: Medical

## 2023-01-26 ENCOUNTER — Encounter: Payer: Self-pay | Admitting: Medical

## 2023-01-26 VITALS — BP 162/80 | HR 72 | Temp 98.0°F | Resp 18 | Ht 69.0 in | Wt 197.0 lb

## 2023-01-26 DIAGNOSIS — I5022 Chronic systolic (congestive) heart failure: Secondary | ICD-10-CM | POA: Diagnosis not present

## 2023-01-26 DIAGNOSIS — Z1211 Encounter for screening for malignant neoplasm of colon: Secondary | ICD-10-CM

## 2023-01-26 DIAGNOSIS — I429 Cardiomyopathy, unspecified: Secondary | ICD-10-CM

## 2023-01-26 DIAGNOSIS — G629 Polyneuropathy, unspecified: Secondary | ICD-10-CM

## 2023-01-26 DIAGNOSIS — R350 Frequency of micturition: Secondary | ICD-10-CM

## 2023-01-26 DIAGNOSIS — I10 Essential (primary) hypertension: Secondary | ICD-10-CM

## 2023-01-26 DIAGNOSIS — Z125 Encounter for screening for malignant neoplasm of prostate: Secondary | ICD-10-CM

## 2023-01-26 DIAGNOSIS — R739 Hyperglycemia, unspecified: Secondary | ICD-10-CM

## 2023-01-26 LAB — COMPREHENSIVE METABOLIC PANEL
ALT: 33 U/L (ref 0–53)
AST: 21 U/L (ref 0–37)
Albumin: 4.5 g/dL (ref 3.5–5.2)
Alkaline Phosphatase: 58 U/L (ref 39–117)
BUN: 18 mg/dL (ref 6–23)
CO2: 31 mEq/L (ref 19–32)
Calcium: 10.1 mg/dL (ref 8.4–10.5)
Chloride: 104 mEq/L (ref 96–112)
Creatinine, Ser: 0.97 mg/dL (ref 0.40–1.50)
GFR: 83.3 mL/min (ref >60.00)
Glucose, Bld: 90 mg/dL (ref 70–99)
Potassium: 4.4 mEq/L (ref 3.5–5.1)
Sodium: 141 mEq/L (ref 135–145)
Total Bilirubin: 0.6 mg/dL (ref 0.2–1.2)
Total Protein: 6.9 g/dL (ref 6.0–8.3)

## 2023-01-26 LAB — PSA: PSA: 0.78 ng/mL (ref 0.10–4.00)

## 2023-01-26 LAB — VITAMIN B12: Vitamin B-12: 134 pg/mL — ABNORMAL LOW (ref 211–911)

## 2023-01-26 LAB — HEMOGLOBIN A1C: Hgb A1c MFr Bld: 5.5 % (ref 4.6–6.5)

## 2023-01-26 LAB — FOLATE: Folate: 8.1 ng/mL (ref >5.9)

## 2023-01-26 MED ORDER — GABAPENTIN 100 MG PO CAPS: 100.0000 mg | ORAL_CAPSULE | Freq: Every day | ORAL | 0 refills | Status: DC

## 2023-01-26 NOTE — Patient Instructions (Addendum)
Essential hypertension, Chronic systolic congestive heart failure and Cardiomyopathy Continue prescription drug management with Carvedilol 25mg  BID, spiro 25mg  daily, Entresto 24-26mg  BID, Hydralazine 100mg  TID, Imdur 60mg  daily and Lasix 20mg  daily. -bp high today. Would ask you check bp daily early afternoon for one week and send me update. Can give numbers to cardiologist to see if she wants tighter control. - Comp Met (CMET)   Screening for colon cancer - Cologuard. Let me know if you don't get supplies/test in mail  Neuropathy(also considered allergic reaction ) but no rash presently yet still symptomatic at times. Rx gabapentin 100 mg at night 30 tab rx. If you feel need during day and helping will give more tabs. If you get rash again let me know. Take picture of the area. - B12 - Vitamin B1 - Folate   Screening for prostate cancer and mild Frequent urination(rare and intermittent) no uti like. - PSA   Follow up 3 weeks or sooner if needed.

## 2023-01-26 NOTE — Progress Notes (Signed)
Subjective:    Patient ID: Matthew Nixon, male    DOB: 06-Dec-1960, 62 y.o.   MRN: [id number]  HPI  Pt in for bilateral slight burning and itching to both lower ext/distal calf area. This has been going on and off randomly. Remembers last summer had similar issue but then resolved.  He noted last week had hyperpigmented reddish rash present left distal pretibial area for one week. Rash now resolved but has burning itching sensation inside out for one month(worse at night but worse at night). Small scab area for 2 weeks. He thinks he broke skin itching.    On review pt has seen cardiologist Dr. Mayford Knife.  ASSESSMENT & PLAN:     Bp when saw cardiologist was 162/82.  "1.  DCM -prior nuclear stress test with no ischemia -echo 2018 with EF 40-45% and decreased to 30-35% on echo 03/2019 -echo 2022 with EF 30-35% with mild LVE, mild RV dysfunction -coronary CTA 6/22 showed minimal  non obstructive CAD with coronary calcium score of 16 of LCx -likely HTN DCM - nuclear stress test in [place] in 2020  with no ischemia -Continue prescription drug management with Carvedilol 25mg  BID, spiro 25mg  daily, Entresto 24-26mg  BID, Hydralazine 100mg  TID, Imdur 60mg  daily and Lasix 20mg  daily   2.  Chronic systolic CHF NYHA class IIb -he appears euvolemic on exam today -weight remains stable -continue prescription drug management with Digoxin 0.125mg  daily, Carvedilol 25mg  BID, Lasix 10mg  daily, Hydralazine 100mg  TID, Imdur 60mg  daily, Entresto 49-15mg  BID, spiro 25mg  daily with PRN refills -I have personally reviewed and interpreted outside labs performed by patient's PCP which showed serum creatinine 1.1 and potassium 4.9 on 08/15/2022   3.  HTN -BP controlled on exam today -Continue prescription drug management with amlodipine 10 mg daily, carvedilol 25 mg twice daily, doxazosin 8 mg daily, hydralazine 100 mg 3 times daily, Imdur 60 mg daily and spironolactone 25 mg daily and Entresto 49-51 mg twice daily  with as needed refills   4.  OSA  -intolerant to CPAP -he is waking up gasping for breath -we discussed the Inspire device but he is not interested   5.  ASCAD -coronary CTA 6/22 showed minimal  non obstructive CAD with coronary calcium score of 16 of LCx -he has some chest pain but sounds atypical but only occurs when he exerts himself -check FLP and ALT -will get Stress PET CT to rule out ischemia"  Pt told to follow up 10-2023.   On review over due for colon CA screening. Placing cologuard order.   On review pt tells me that early afternoon bp is better. 150 range systolic.   Review of Systems  Constitutional:  Negative for chills, fatigue and fever.  Respiratory:  Negative for chest tightness, shortness of breath and wheezing.   Cardiovascular:  Negative for chest pain and palpitations.  Gastrointestinal:  Negative for abdominal pain, blood in stool, constipation and nausea.  Genitourinary:  Positive for frequency.       Occasional at night.  Musculoskeletal:  Negative for back pain and myalgias.  Neurological:  Negative for dizziness, seizures, syncope, weakness and headaches.  Hematological:  Negative for adenopathy. Does not bruise/bleed easily.  Psychiatric/Behavioral:  Negative for behavioral problems and decreased concentration.    Past Medical History:  Diagnosis Date   Allergy    CAD (coronary artery disease), native coronary artery    25-49% prox to mid RCA by coronary CTA 11/2020   Cardiomyopathy [facility])    DCM  with EF 32% on echo 2014 and no ischemia on nuclear stress test.  EF improved to 40-45% by echo 02/2017   Chronic back pain    low back   Chronic systolic CHF (congestive heart failure), NYHA class 2 (HCC) 08/07/2016   COPD (chronic obstructive pulmonary disease) (HCC)    Dyspnea    with exertion   EKG, abnormal    history of left bundle block since 2002 per pt   GERD (gastroesophageal reflux disease)    Headache    Hypertension    LBBB (left bundle  branch block)    OSA on CPAP 08/07/2016   Oxygen deficiency    Pain    right side pain for many years   PTSD (post-traumatic stress disorder)    Sleep apnea      Social History   Socioeconomic History   Marital status: Married    Spouse name: Not on file   Number of children: Not on file   Years of education: Not on file   Highest education level: Not on file  Occupational History   Not on file  Tobacco Use   Smoking status: Never   Smokeless tobacco: Never  Vaping Use   Vaping status: Never Used  Substance and Sexual Activity   Alcohol use: No   Drug use: No   Sexual activity: Not on file  Other Topics Concern   Not on file  Social History Narrative   2 years college    Social Determinants of Health   Financial Resource Strain: Not on file  Food Insecurity: Not on file  Transportation Needs: Not on file  Physical Activity: Not on file  Stress: Not on file  Social Connections: Not on file  Intimate Partner Violence: Not on file    Past Surgical History:  Procedure Laterality Date   ANKLE SURGERY Right    CARDIAC CATHETERIZATION  yrs ago   ESOPHAGOGASTRODUODENOSCOPY (EGD) WITH PROPOFOL N/A 01/13/2017   Procedure: ESOPHAGOGASTRODUODENOSCOPY (EGD) WITH PROPOFOL;  Surgeon: Hilarie Fredrickson, MD;  Location: [facility];  Service: Endoscopy;  Laterality: N/A;   HAND SURGERY     Right   HAND SURGERY     Left carpel tunnel   THUMB ARTHROSCOPY Left     Family History  Problem Relation Age of Onset   Heart disease Mother    Heart disease Father    Colon cancer Neg Hx    Esophageal cancer Neg Hx    Pancreatic cancer Neg Hx    Stomach cancer Neg Hx     No Known Allergies  Current Outpatient Medications on File Prior to Visit  Medication Sig Dispense Refill   albuterol (VENTOLIN HFA) 108 (90 Base) MCG/ACT inhaler Inhale 2 puffs into the lungs every 6 (six) hours as needed. 18 g 0   allopurinol (ZYLOPRIM) 100 MG tablet Take 1 tablet (100 mg total) by mouth daily.  30 tablet 3   amLODipine (NORVASC) 10 MG tablet Take 1 tablet (10 mg total) by mouth daily. 90 tablet 3   aspirin 81 MG tablet Take 81 mg by mouth daily.     budesonide-formoterol (SYMBICORT) 160-4.5 MCG/ACT inhaler Inhale 2 puffs into the lungs 2 (two) times daily. 1 each 12   carvedilol (COREG) 25 MG tablet Take 1 tablet (25 mg total) by mouth 2 (two) times daily with a meal. 180 tablet 3   diclofenac Sodium (PENNSAID) 2 % SOLN Place 2 Pump (40 mg total) onto the skin 2 (two) times daily. 112  g 2   digoxin (LANOXIN) 0.125 MG tablet Take 1 tablet (0.125 mg total) by mouth daily. 90 tablet 3   doxazosin (CARDURA) 8 MG tablet Take 1 tablet (8 mg total) by mouth daily. 90 tablet 3   FLOVENT HFA 220 MCG/ACT inhaler Inhale into the lungs.     fluticasone (FLONASE) 50 MCG/ACT nasal spray Place 2 sprays into both nostrils daily. 16 g 1   fluticasone (FLONASE) 50 MCG/ACT nasal spray Place 2 sprays into both nostrils daily. 16 g 1   furosemide (LASIX) 20 MG tablet Take 0.5 tablets (10 mg total) by mouth daily. 90 tablet 3   hydrALAZINE (APRESOLINE) 100 MG tablet Take 1 tablet (100 mg total) by mouth 3 (three) times daily. 270 tablet 3   hydrOXYzine (ATARAX/VISTARIL) 10 MG tablet 1-2 tab po q 8 hours as needed itching. 30 tablet 0   ipratropium (ATROVENT HFA) 17 MCG/ACT inhaler Inhale 2 puffs into the lungs every 6 (six) hours as needed for wheezing. 1 Inhaler 3   ISOSORBIDE MONONITRATE PO Take 60 mg by mouth daily.     meclizine (ANTIVERT) 12.5 MG tablet Take 1 tablet (12.5 mg total) by mouth 3 (three) times daily as needed for dizziness. 30 tablet 0   olopatadine (PATANOL) 0.1 % ophthalmic solution Place 1 drop into both eyes 2 (two) times daily. 5 mL 12   omeprazole (PRILOSEC) 40 MG capsule Take 1 capsule (40 mg total) by mouth daily. 90 capsule 3   ranitidine (ZANTAC) 150 MG capsule Take 1 capsule (150 mg total) by mouth 2 (two) times daily. (Patient taking differently: Take 150 mg by mouth as needed.)  60 capsule 6   rosuvastatin (CRESTOR) 5 MG tablet Take 1 tablet (5 mg total) by mouth daily. 90 tablet 3   sacubitril-valsartan (ENTRESTO) 49-51 MG Take 1 tablet by mouth twice daily 180 tablet 1   spironolactone (ALDACTONE) 25 MG tablet Take 1 tablet (25 mg total) by mouth daily. 90 tablet 3   terbinafine (LAMISIL) 250 MG tablet Take by mouth.     No current facility-administered medications on file prior to visit.    BP (!) 162/80 Comment: on recheck.  Pulse 72   Temp 98 F (36.7 C)   Resp 18   Ht 5\' 9"  (1.753 m)   Wt 197 lb (89.4 kg)   SpO2 99%   BMI 29.09 kg/m          Objective:   Physical Exam  General Mental Status- Alert. General Appearance- Not in acute distress.   Skin General: Color- Normal Color. Moisture- Normal Moisture.  Neck Carotid Arteries- Normal color. Moisture- Normal Moisture. No carotid bruits. No JVD.  Chest and Lung Exam Auscultation: Breath Sounds:-Normal.  Cardiovascular Auscultation:Rythm- Regular. Murmurs & Other Heart Sounds:Auscultation of the heart reveals- No Murmurs.  Abdomen Inspection:-Inspeection Normal. Palpation/Percussion:Note:No mass. Palpation and Percussion of the abdomen reveal- Non Tender, Non Distended + BS, no rebound or guarding.   Neurologic Cranial Nerve exam:- CN III-XII intact(No nystagmus), symmetric smile. Strength:- 5/5 equal and symmetric strength both upper and lower extremities.   Lower ext- no rash, no pedal edeam. Negative homans sign bilaterally.    Assessment & Plan:    Essential hypertension, Chronic systolic congestive heart failure and Cardiomyopathy Continue prescription drug management with Carvedilol 25mg  BID, spiro 25mg  daily, Entresto 24-26mg  BID, Hydralazine 100mg  TID, Imdur 60mg  daily and Lasix 20mg  daily. -bp high today. Would ask you check bp daily early afternoon for one week and send me update.  Can give numbers to cardiologist to see if she wants tighter control. - Comp Met  (CMET)   Screening for colon cancer - Cologuard. Let me know if you don't get supplies/test in mail  Neuropathy(also considered allergic reaction ) but no rash presently yet still symptomatic at times. Rx gabapentin 100 mg at night 30 tab rx. If you feel need during day and helping will give more tabs. If you get rash again let me know. Take picture of the area. - B12 - Vitamin B1 - Folate   Screening for prostate cancer and mild Frequent urination(rare and intermittent) no uti like. - PSA   Follow up 3 weeks or sooner if needed.

## 2023-02-03 DIAGNOSIS — Z1211 Encounter for screening for malignant neoplasm of colon: Secondary | ICD-10-CM | POA: Diagnosis not present

## 2023-10-05 ENCOUNTER — Encounter: Payer: Self-pay | Admitting: Medical

## 2023-10-05 ENCOUNTER — Ambulatory Visit (INDEPENDENT_AMBULATORY_CARE_PROVIDER_SITE_OTHER): Admitting: Medical

## 2023-10-05 ENCOUNTER — Other Ambulatory Visit: Payer: Self-pay | Admitting: Medical

## 2023-10-05 VITALS — BP 150/90 | HR 65 | Temp 98.3°F | Resp 18 | Ht 69.0 in | Wt 196.0 lb

## 2023-10-05 DIAGNOSIS — R067 Sneezing: Secondary | ICD-10-CM | POA: Diagnosis not present

## 2023-10-05 DIAGNOSIS — R059 Cough, unspecified: Secondary | ICD-10-CM | POA: Diagnosis not present

## 2023-10-05 DIAGNOSIS — I5020 Unspecified systolic (congestive) heart failure: Secondary | ICD-10-CM | POA: Diagnosis not present

## 2023-10-05 DIAGNOSIS — R0981 Nasal congestion: Secondary | ICD-10-CM

## 2023-10-05 DIAGNOSIS — J301 Allergic rhinitis due to pollen: Secondary | ICD-10-CM | POA: Diagnosis not present

## 2023-10-05 DIAGNOSIS — Z111 Encounter for screening for respiratory tuberculosis: Secondary | ICD-10-CM

## 2023-10-05 MED ORDER — FLUTICASONE PROPIONATE 50 MCG/ACT NA SUSP: 2.0000 | Freq: Every day | NASAL | 1 refills | Status: AC

## 2023-10-05 MED ORDER — ENTRESTO 49-51 MG PO TABS: 1.0000 | ORAL_TABLET | Freq: Two times a day (BID) | ORAL | 3 refills | Status: DC

## 2023-10-05 MED ORDER — ISOSORBIDE MONONITRATE ER 60 MG PO TB24: 60.0000 mg | ORAL_TABLET | Freq: Every day | ORAL | 0 refills | Status: DC

## 2023-10-05 MED ORDER — FUROSEMIDE 20 MG PO TABS: 10.0000 mg | ORAL_TABLET | Freq: Every day | ORAL | 0 refills | Status: DC

## 2023-10-05 MED ORDER — LEVOCETIRIZINE DIHYDROCHLORIDE 5 MG PO TABS: 5.0000 mg | ORAL_TABLET | Freq: Every evening | ORAL | 0 refills | Status: AC

## 2023-10-05 MED ORDER — SPIRONOLACTONE 25 MG PO TABS: 25.0000 mg | ORAL_TABLET | Freq: Every day | ORAL | 0 refills | Status: DC

## 2023-10-05 MED ORDER — CARVEDILOL 25 MG PO TABS: 25.0000 mg | ORAL_TABLET | Freq: Two times a day (BID) | ORAL | 0 refills | Status: DC

## 2023-10-05 MED ORDER — DIGOXIN 125 MCG PO TABS: 0.1250 mg | ORAL_TABLET | Freq: Every day | ORAL | 0 refills | Status: DC

## 2023-10-05 MED ORDER — HYDRALAZINE HCL 100 MG PO TABS: 100.0000 mg | ORAL_TABLET | Freq: Three times a day (TID) | ORAL | 0 refills | Status: DC

## 2023-10-05 NOTE — Patient Instructions (Signed)
 Congestive Heart Failure (CHF) CHF with mild dyspnea. Monitoring for medication-induced hyperkalemia necessary. No recent cardiology follow-up. - Refill digoxin, carvedilol, furosemide, hydralazine, isosorbide mononitrate, sacubitril/valsartan, spironolactone. These all recommended to continue on last cardiologist note. - Order metabolic panel to monitor potassium. - Order BNP to assess heart failure status. - Order chest x-ray to evaluate dyspnea.  Allergic Rhinitis Symptoms likely exacerbated by pollen season, indicating non-infectious etiology. - Prescribe levocetirizine. - Prescribe fluticasone nasal spray.  Tuberculosis Exposure Risk Potential TB exposure through contact with a friend living outdoors. No symptoms of active TB. - Order QuantiFERON-TB Gold test.  Follow-up Cardiology follow-up needed by May 15th or sooner if lab results indicate abnormalities. - Schedule cardiology follow-up by May 15th. - Determine internal medicine follow-up based on test results and symptom resolution.

## 2023-10-05 NOTE — Progress Notes (Signed)
 Subjective:    Patient ID: Matthew Nixon, male    DOB: [birth year], 63 y.o.   MRN: [id number]  HPI  The patient, with congestive heart failure, presents with worsening dry cough and shortness of breath.  The patient has experienced a worsening dry cough and shortness of breath since returning from the [place] on Saturday night. They deny fever, chills, or night sweats. They have a history of congestive heart failure and have not seen their cardiologist since before their trip.  Their current medications include digoxin 0.125 mg daily, carvedilol 25 mg twice daily, Lasix 10 mg daily, hydralazine 100 mg three times a day, Imdur 60 mg daily, Entresto 49 mg twice daily, and spironolactone 25 mg daily, with no recent changes to this regimen. They experience some shortness of breath but deny significant leg swelling.  They report sneezing, nasal congestion, and itchy eyes, which they attribute to allergies. No flu-like body aches are present.  They have a history of contact with a friend who is homeless, whom they see twice a week to provide food. These interactions occur outdoors, and they have previously tested negative for tuberculosis.  They mention experiencing lower back pain, which they attribute to the long flight back from the [place].          Review of Systems  Constitutional:  Negative for chills, fatigue and fever.  HENT:  Positive for congestion, postnasal drip and sneezing. Negative for ear pain and tinnitus.   Respiratory:  Positive for cough. Negative for wheezing.        Mild dyspnea.  Cardiovascular:  Negative for chest pain and palpitations.  Gastrointestinal:  Negative for abdominal pain.  Musculoskeletal:  Negative for back pain, myalgias and neck pain.  Skin:  Negative for rash.  Neurological:  Negative for dizziness, syncope and light-headedness.  Hematological:  Negative for adenopathy. Does not bruise/bleed easily.   Psychiatric/Behavioral:  Negative for behavioral problems and dysphoric mood. The patient is not nervous/anxious.     Past Medical History:  Diagnosis Date   Allergy    CAD (coronary artery disease), native coronary artery    25-49% prox to mid RCA by coronary CTA 11/2020   Cardiomyopathy [facility])    DCM with EF 32% on echo 2014 and no ischemia on nuclear stress test.  EF improved to 40-45% by echo 02/2017   Chronic back pain    low back   Chronic systolic CHF (congestive heart failure), NYHA class 2 (HCC) 08/07/2016   COPD (chronic obstructive pulmonary disease) (HCC)    Dyspnea    with exertion   EKG, abnormal    history of left bundle block since 2002 per pt   GERD (gastroesophageal reflux disease)    Headache    Hypertension    LBBB (left bundle branch block)    OSA on CPAP 08/07/2016   Oxygen deficiency    Pain    right side pain for many years   PTSD (post-traumatic stress disorder)    Sleep apnea      Social History   Socioeconomic History   Marital status: Married    Spouse name: Not on file   Number of children: Not on file   Years of education: Not on file   Highest education level: Not on file  Occupational History   Not on file  Tobacco Use   Smoking status: Never   Smokeless tobacco: Never  Vaping Use   Vaping status: Never Used  Substance and Sexual Activity  Alcohol use: No   Drug use: No   Sexual activity: Not on file  Other Topics Concern   Not on file  Social History Narrative   2 years college    Social Drivers of Health   Financial Resource Strain: Not on file  Food Insecurity: Not on file  Transportation Needs: Not on file  Physical Activity: Not on file  Stress: Not on file  Social Connections: Not on file  Intimate Partner Violence: Not on file    Past Surgical History:  Procedure Laterality Date   ANKLE SURGERY Right    CARDIAC CATHETERIZATION  yrs ago   ESOPHAGOGASTRODUODENOSCOPY (EGD) WITH PROPOFOL N/A 01/13/2017    Procedure: ESOPHAGOGASTRODUODENOSCOPY (EGD) WITH PROPOFOL;  Surgeon: Tobin Forts, MD;  Location: [facility];  Service: Endoscopy;  Laterality: N/A;   HAND SURGERY     Right   HAND SURGERY     Left carpel tunnel   THUMB ARTHROSCOPY Left     Family History  Problem Relation Age of Onset   Heart disease Mother    Heart disease Father    Colon cancer Neg Hx    Esophageal cancer Neg Hx    Pancreatic cancer Neg Hx    Stomach cancer Neg Hx     No Known Allergies  Current Outpatient Medications on File Prior to Visit  Medication Sig Dispense Refill   albuterol (VENTOLIN HFA) 108 (90 Base) MCG/ACT inhaler INHALE 2 PUFFS BY MOUTH EVERY 6 HOURS AS NEEDED 18 g 0   allopurinol (ZYLOPRIM) 100 MG tablet Take 1 tablet (100 mg total) by mouth daily. 30 tablet 3   amLODipine (NORVASC) 10 MG tablet Take 1 tablet by mouth once daily 90 tablet 0   aspirin 81 MG tablet Take 81 mg by mouth daily.     budesonide-formoterol (SYMBICORT) 160-4.5 MCG/ACT inhaler Inhale 2 puffs into the lungs 2 (two) times daily. 1 each 12   diclofenac Sodium (PENNSAID) 2 % SOLN Place 2 Pump (40 mg total) onto the skin 2 (two) times daily. 112 g 2   doxazosin (CARDURA) 8 MG tablet Take 1 tablet (8 mg total) by mouth daily. 90 tablet 3   FLOVENT HFA 220 MCG/ACT inhaler Inhale into the lungs.     fluticasone (FLONASE) 50 MCG/ACT nasal spray Place 2 sprays into both nostrils daily. 16 g 1   fluticasone (FLONASE) 50 MCG/ACT nasal spray Place 2 sprays into both nostrils daily. 16 g 1   gabapentin (NEURONTIN) 100 MG capsule Take 1 capsule by mouth at bedtime 30 capsule 0   hydrOXYzine (ATARAX/VISTARIL) 10 MG tablet 1-2 tab po q 8 hours as needed itching. 30 tablet 0   ipratropium (ATROVENT HFA) 17 MCG/ACT inhaler Inhale 2 puffs into the lungs every 6 (six) hours as needed for wheezing. 1 Inhaler 3   ISOSORBIDE MONONITRATE PO Take 60 mg by mouth daily.     meclizine (ANTIVERT) 12.5 MG tablet Take 1 tablet (12.5 mg total) by  mouth 3 (three) times daily as needed for dizziness. 30 tablet 0   olopatadine (PATANOL) 0.1 % ophthalmic solution Place 1 drop into both eyes 2 (two) times daily. 5 mL 12   omeprazole (PRILOSEC) 40 MG capsule Take 1 capsule (40 mg total) by mouth daily. 90 capsule 3   ranitidine (ZANTAC) 150 MG capsule Take 1 capsule (150 mg total) by mouth 2 (two) times daily. (Patient taking differently: Take 150 mg by mouth as needed.) 60 capsule 6   rosuvastatin (CRESTOR) 5 MG  tablet Take 1 tablet (5 mg total) by mouth daily. 90 tablet 3   terbinafine (LAMISIL) 250 MG tablet Take by mouth.     No current facility-administered medications on file prior to visit.    BP (!) 150/90   Pulse 65   Temp 98.3 F (36.8 C)   Resp 18   Ht 5\' 9"  (1.753 m)   Wt 196 lb (88.9 kg)   SpO2 97%   BMI 28.94 kg/m        Objective:   Physical Exam   General Mental Status- Alert. General Appearance- Not in acute distress.   Skin General: Color- Normal Color. Moisture- Normal Moisture.  Neck  No JVD. No tracheal deviation.  Chest and Lung Exam Auscultation: Breath Sounds:-CTA  Cardiovascular Auscultation:Rythm- RRR Murmurs & Other Heart Sounds:Auscultation of the heart reveals- No Murmurs.  Abdomen Inspection:-Inspeection Normal. Palpation/Percussion:Note:No mass. Palpation and Percussion of the abdomen reveal- Non Tender, Non Distended + BS, no rebound or guarding.   Neurologic Cranial Nerve exam:- CN III-XII intact(No nystagmus), symmetric smile. Strength:- 5/5 equal and symmetric strength both upper and lower extremities.    Lower ext- calfs symmetric, negative homans signs. No pedal edema.   Heent- boggy turbinates. Faint maxillary sinus pressure. +pnd.    Assessment & Plan:   Patient Instructions  Congestive Heart Failure (CHF) CHF with mild dyspnea. Monitoring for medication-induced hyperkalemia necessary. No recent cardiology follow-up. - Refill digoxin, carvedilol, furosemide,  hydralazine, isosorbide mononitrate, sacubitril/valsartan, spironolactone. These all recommended to continue on last cardiologist note. - Order metabolic panel to monitor potassium. - Order BNP to assess heart failure status. - Order chest x-ray to evaluate dyspnea.  Allergic Rhinitis Symptoms likely exacerbated by pollen season, indicating non-infectious etiology. - Prescribe levocetirizine. - Prescribe fluticasone nasal spray.  Tuberculosis Exposure Risk Potential TB exposure through contact with a friend living outdoors. No symptoms of active TB. - Order QuantiFERON-TB Gold test.  Follow-up Cardiology follow-up needed by May 15th or sooner if lab results indicate abnormalities. - Schedule cardiology follow-up by May 15th. - Determine internal medicine follow-up based on test results and symptom resolution.

## 2023-10-06 ENCOUNTER — Other Ambulatory Visit (INDEPENDENT_AMBULATORY_CARE_PROVIDER_SITE_OTHER)

## 2023-10-06 ENCOUNTER — Ambulatory Visit (HOSPITAL_BASED_OUTPATIENT_CLINIC_OR_DEPARTMENT_OTHER)
Admission: RE | Admit: 2023-10-06 | Discharge: 2023-10-06 | Disposition: A | Discharge status: Home / Self Care | Source: Ambulatory Visit | Attending: Medical | Admitting: Medical

## 2023-10-06 ENCOUNTER — Telehealth: Payer: Self-pay | Admitting: Medical

## 2023-10-06 DIAGNOSIS — R0600 Dyspnea, unspecified: Secondary | ICD-10-CM | POA: Diagnosis not present

## 2023-10-06 DIAGNOSIS — I5020 Unspecified systolic (congestive) heart failure: Secondary | ICD-10-CM | POA: Diagnosis not present

## 2023-10-06 DIAGNOSIS — I509 Heart failure, unspecified: Secondary | ICD-10-CM | POA: Diagnosis not present

## 2023-10-06 LAB — COMPREHENSIVE METABOLIC PANEL WITH GFR
ALT: 36 U/L (ref 0–53)
AST: 24 U/L (ref 0–37)
Albumin: 5 g/dL (ref 3.5–5.2)
Alkaline Phosphatase: 67 U/L (ref 39–117)
BUN: 15 mg/dL (ref 6–23)
CO2: 28 meq/L (ref 19–32)
Calcium: 10.1 mg/dL (ref 8.4–10.5)
Chloride: 102 meq/L (ref 96–112)
Creatinine, Ser: 0.91 mg/dL (ref 0.40–1.50)
GFR: 89.5 mL/min (ref >60.00)
Glucose, Bld: 84 mg/dL (ref 70–99)
Potassium: 4 meq/L (ref 3.5–5.1)
Sodium: 140 meq/L (ref 135–145)
Total Bilirubin: 0.6 mg/dL (ref 0.2–1.2)
Total Protein: 7.6 g/dL (ref 6.0–8.3)

## 2023-10-06 LAB — BRAIN NATRIURETIC PEPTIDE: Pro B Natriuretic peptide (BNP): 95 pg/mL (ref 0.0–100.0)

## 2023-10-06 NOTE — Telephone Encounter (Signed)
 Called pt for a voicemail for redraw

## 2023-10-06 NOTE — Telephone Encounter (Signed)
 Called pt and left a voicemail for a redraw.

## 2023-10-06 NOTE — Addendum Note (Signed)
 Addended by: Marigene Shoulder on: 10/06/2023 08:15 AM   Modules accepted: Orders

## 2023-10-06 NOTE — Progress Notes (Signed)
 Redraw, no charge per [place].

## 2023-10-08 LAB — QUANTIFERON-TB GOLD PLUS
Mitogen-NIL: 8.03 [IU]/mL
NIL: 0.04 [IU]/mL
QuantiFERON-TB Gold Plus: NEGATIVE
TB1-NIL: <0 [IU]/mL
TB2-NIL: 0 [IU]/mL

## 2023-11-11 ENCOUNTER — Telehealth: Payer: Self-pay | Admitting: Medical

## 2023-11-11 NOTE — Telephone Encounter (Signed)
 Pt came in the office requesting a appt. States he is feeling lightheaded and noticed a bump at the base of his skull/ back of neck area. Advised pt pcp is not in the office today and asked if he would be willing to see someone else. He declined. Advised pt it would not be recommended to wait until pcp's next available and offered a nurse to come up here to speak to him for possible triage. He declined stating he knows why this happens and would feel more comfortable waiting.

## 2023-11-18 ENCOUNTER — Encounter: Payer: Self-pay | Admitting: Medical

## 2023-11-18 ENCOUNTER — Ambulatory Visit (INDEPENDENT_AMBULATORY_CARE_PROVIDER_SITE_OTHER): Admitting: Medical

## 2023-11-18 VITALS — BP 140/78 | HR 77 | Resp 18 | Ht 69.0 in | Wt 195.0 lb

## 2023-11-18 DIAGNOSIS — R591 Generalized enlarged lymph nodes: Secondary | ICD-10-CM | POA: Diagnosis not present

## 2023-11-18 DIAGNOSIS — L089 Local infection of the skin and subcutaneous tissue, unspecified: Secondary | ICD-10-CM

## 2023-11-18 DIAGNOSIS — R519 Headache, unspecified: Secondary | ICD-10-CM | POA: Diagnosis not present

## 2023-11-18 LAB — CBC WITH DIFFERENTIAL/PLATELET
Basophils Absolute: 0 10*3/uL (ref 0.0–0.1)
Basophils Relative: 0.2 % (ref 0.0–3.0)
Eosinophils Absolute: 0.1 10*3/uL (ref 0.0–0.7)
Eosinophils Relative: 0.9 % (ref 0.0–5.0)
HCT: 51.9 % (ref 39.0–52.0)
Hemoglobin: 17.6 g/dL — ABNORMAL HIGH (ref 13.0–17.0)
Lymphocytes Relative: 24.9 % (ref 12.0–46.0)
Lymphs Abs: 1.8 10*3/uL (ref 0.7–4.0)
MCHC: 34 g/dL (ref 30.0–36.0)
MCV: 93.5 fl (ref 78.0–100.0)
Monocytes Absolute: 0.6 10*3/uL (ref 0.1–1.0)
Monocytes Relative: 8.4 % (ref 3.0–12.0)
Neutro Abs: 4.6 10*3/uL (ref 1.4–7.7)
Neutrophils Relative %: 65.6 % (ref 43.0–77.0)
Platelets: 240 10*3/uL (ref 150.0–400.0)
RBC: 5.55 Mil/uL (ref 4.22–5.81)
RDW: 13.3 % (ref 11.5–15.5)
WBC: 7.1 10*3/uL (ref 4.0–10.5)

## 2023-11-18 LAB — SEDIMENTATION RATE: Sed Rate: 1 mm/h (ref 0–20)

## 2023-11-18 MED ORDER — DOXYCYCLINE HYCLATE 100 MG PO TABS: 100.0000 mg | ORAL_TABLET | Freq: Two times a day (BID) | ORAL | 0 refills | Status: DC

## 2023-11-18 NOTE — Patient Instructions (Signed)
 Skin infection with lymphadenopathy Tender, red area in left trapezius likely lymphadenopathy due to infection. - Prescribed doxycycline  100 mg BID for 10 days. - Ordered CBC to assess leukocytes. - Re-evaluate in 7-10 days; consider ultrasound if lump persists.  Tension headache Headache linked to muscle tension in left trapezius. - Instructed to report any changes or worsening of headache. -can use tylenol  for pain. Hopefully with left side skin infection resolving trapezius tightness ha will resolve.   Possibility of temporal arteritis Left temporal pain suggests temporal arteritis. - Ordered sed rate to evaluate for temporal arteritis.  Follow up 7-10 days or sooner if needed

## 2023-11-18 NOTE — Progress Notes (Signed)
 Subjective:    Patient ID: Matthew Nixon, male    DOB: Mar 17, 1961, 63 y.o.   MRN: [id number]  HPI  Kia Stavros is a 64 year old male who presents with a bump on the back of his head and associated headaches.  Approximately one week ago, he discovered a palpable bump on the back of his head while getting a haircut. The bump can be grasped with his fingers, but he is uncertain about how long it had been present before this discovery.  Since noticing the bump, he has been experiencing headaches, which he describes as 'weird'.  The bump itself is not painful, but he experiences a sensation like a 'short electrical shock' radiating to his head when moving his neck/left trapezius are. He notes tightness on the left side of his neck, though it is not painful.  No fever, chills, or sweats, although he mentions feeling cold due to the weather. No dizziness, nausea, or vomiting, but he states mild left temporal ha. No vision changes.  Past Medical History:  Diagnosis Date   Allergy     CAD (coronary artery disease), native coronary artery    25-49% prox to mid RCA by coronary CTA 11/2020   Cardiomyopathy [facility])    DCM with EF 32% on echo 2014 and no ischemia on nuclear stress test.  EF improved to 40-45% by echo 02/2017   Chronic back pain    low back   Chronic systolic CHF (congestive heart failure), NYHA class 2 (HCC) 08/07/2016   COPD (chronic obstructive pulmonary disease) (HCC)    Dyspnea    with exertion   EKG, abnormal    history of left bundle block since 2002 per pt   GERD (gastroesophageal reflux disease)    Headache    Hypertension    LBBB (left bundle branch block)    OSA on CPAP 08/07/2016   Oxygen  deficiency    Pain    right side pain for many years   PTSD (post-traumatic stress disorder)    Sleep apnea      Social History   Socioeconomic History   Marital status: Married    Spouse name: Not on file   Number of children: Not on file   Years of education: Not on  file   Highest education level: Not on file  Occupational History   Not on file  Tobacco Use   Smoking status: Never   Smokeless tobacco: Never  Vaping Use   Vaping status: Never Used  Substance and Sexual Activity   Alcohol use: No   Drug use: No   Sexual activity: Not on file  Other Topics Concern   Not on file  Social History Narrative   2 years college    Social Drivers of Health   Financial Resource Strain: Not on file  Food Insecurity: Not on file  Transportation Needs: Not on file  Physical Activity: Not on file  Stress: Not on file  Social Connections: Not on file  Intimate Partner Violence: Not on file    Past Surgical History:  Procedure Laterality Date   ANKLE SURGERY Right    CARDIAC CATHETERIZATION  yrs ago   ESOPHAGOGASTRODUODENOSCOPY (EGD) WITH PROPOFOL  N/A 01/13/2017   Procedure: ESOPHAGOGASTRODUODENOSCOPY (EGD) WITH PROPOFOL ;  Surgeon: Tobin Forts, MD;  Location: [facility];  Service: Endoscopy;  Laterality: N/A;   HAND SURGERY     Right   HAND SURGERY     Left carpel tunnel   THUMB ARTHROSCOPY Left  Family History  Problem Relation Age of Onset   Heart disease Mother    Heart disease Father    Colon cancer Neg Hx    Esophageal cancer Neg Hx    Pancreatic cancer Neg Hx    Stomach cancer Neg Hx     No Known Allergies  Current Outpatient Medications on File Prior to Visit  Medication Sig Dispense Refill   albuterol  (VENTOLIN  HFA) 108 (90 Base) MCG/ACT inhaler INHALE 2 PUFFS BY MOUTH EVERY 6 HOURS AS NEEDED 18 g 0   allopurinol  (ZYLOPRIM ) 100 MG tablet Take 1 tablet (100 mg total) by mouth daily. 30 tablet 3   amLODipine  (NORVASC ) 10 MG tablet Take 1 tablet by mouth once daily 90 tablet 0   aspirin 81 MG tablet Take 81 mg by mouth daily.     budesonide -formoterol  (SYMBICORT ) 160-4.5 MCG/ACT inhaler Inhale 2 puffs into the lungs 2 (two) times daily. 1 each 12   carvedilol  (COREG ) 25 MG tablet Take 1 tablet (25 mg total) by mouth 2 (two)  times daily with a meal. 180 tablet 0   diclofenac  Sodium (PENNSAID ) 2 % SOLN Place 2 Pump (40 mg total) onto the skin 2 (two) times daily. 112 g 2   digoxin  (LANOXIN ) 0.125 MG tablet Take 1 tablet (0.125 mg total) by mouth daily. 90 tablet 0   doxazosin  (CARDURA ) 8 MG tablet Take 1 tablet (8 mg total) by mouth daily. 90 tablet 3   FLOVENT  HFA 220 MCG/ACT inhaler Inhale into the lungs.     fluticasone  (FLONASE ) 50 MCG/ACT nasal spray Place 2 sprays into both nostrils daily. 16 g 1   fluticasone  (FLONASE ) 50 MCG/ACT nasal spray Place 2 sprays into both nostrils daily. 16 g 1   fluticasone  (FLONASE ) 50 MCG/ACT nasal spray Place 2 sprays into both nostrils daily. 16 g 1   furosemide  (LASIX ) 20 MG tablet Take 0.5 tablets (10 mg total) by mouth daily. 45 tablet 0   gabapentin  (NEURONTIN ) 100 MG capsule Take 1 capsule by mouth at bedtime 30 capsule 0   hydrALAZINE  (APRESOLINE ) 100 MG tablet Take 1 tablet (100 mg total) by mouth 3 (three) times daily. 270 tablet 0   hydrOXYzine  (ATARAX /VISTARIL ) 10 MG tablet 1-2 tab po q 8 hours as needed itching. 30 tablet 0   ipratropium (ATROVENT  HFA) 17 MCG/ACT inhaler Inhale 2 puffs into the lungs every 6 (six) hours as needed for wheezing. 1 Inhaler 3   isosorbide  mononitrate (IMDUR ) 60 MG 24 hr tablet Take 1 tablet (60 mg total) by mouth daily. 90 tablet 0   ISOSORBIDE  MONONITRATE PO Take 60 mg by mouth daily.     levocetirizine (XYZAL ) 5 MG tablet Take 1 tablet (5 mg total) by mouth every evening. 30 tablet 0   meclizine  (ANTIVERT ) 12.5 MG tablet Take 1 tablet (12.5 mg total) by mouth 3 (three) times daily as needed for dizziness. 30 tablet 0   olopatadine  (PATANOL) 0.1 % ophthalmic solution Place 1 drop into both eyes 2 (two) times daily. 5 mL 12   omeprazole  (PRILOSEC) 40 MG capsule Take 1 capsule (40 mg total) by mouth daily. 90 capsule 3   ranitidine  (ZANTAC ) 150 MG capsule Take 1 capsule (150 mg total) by mouth 2 (two) times daily. (Patient taking  differently: Take 150 mg by mouth as needed.) 60 capsule 6   rosuvastatin  (CRESTOR ) 5 MG tablet Take 1 tablet (5 mg total) by mouth daily. 90 tablet 3   sacubitril-valsartan  (ENTRESTO ) 49-51 MG Take 1 tablet  by mouth 2 (two) times daily. 180 tablet 3   spironolactone  (ALDACTONE ) 25 MG tablet Take 1 tablet (25 mg total) by mouth daily. 90 tablet 0   terbinafine  (LAMISIL ) 250 MG tablet Take by mouth.     No current facility-administered medications on file prior to visit.    BP (!) 140/78   Pulse 77   Resp 18   Ht 5\' 9"  (1.753 m)   Wt 195 lb (88.5 kg)   SpO2 99%   BMI 28.80 kg/m        Review of Systems  Constitutional:  Negative for chills, fatigue and fever.  Respiratory:  Negative for cough, chest tightness, shortness of breath and wheezing.   Cardiovascular:  Negative for chest pain and palpitations.  Gastrointestinal:  Negative for abdominal pain.  Musculoskeletal:  Negative for back pain.  Skin:  Negative for rash.  Neurological:  Negative for dizziness, speech difficulty, weakness and light-headedness.       Faint low level ha with left side neck pain but no associated neurologic signs/symptoms. Some temporal area pain.   Hematological:        See exam. Possible lymph node back of neck  Psychiatric/Behavioral:  Negative for behavioral problems and dysphoric mood.               Objective:   Physical Exam  General Mental Status- Alert. General Appearance- Not in acute distress.   Skin -left trap slight red area about 2 cm wide. No fluctance.   Neck NECK: Left trapezius area with a red circular area,  induration on palpation, possible lymph node size of pea. no neck stiffness  Chest and Lung Exam Auscultation: Breath Sounds:-Normal.  Cardiovascular Auscultation:Rythm- Regular. Murmurs & Other Heart Sounds:Auscultation of the heart reveals- No Murmurs.  Abdomen Inspection:-Inspeection Normal. Palpation/Percussion:Note:No mass. Palpation and Percussion  of the abdomen reveal- Non Tender, Non Distended + BS, no rebound or guarding.   Neurologic Cranial Nerve exam:- CN III-XII intact(No nystagmus), symmetric smile. Drift Test:- No drift. Finger to Nose:- Normal/Intact Strength:- 5/5 equal and symmetric strength both upper and lower extremities.   Lower ext- no pedal edema, negative homans sign bilaterally. Calfs symmetric    Assessment & Plan:   Skin infection with lymphadenopathy Tender, red area in left trapezius likely lymphadenopathy due to infection. - Prescribed doxycycline  100 mg BID for 10 days. - Ordered CBC to assess leukocytes. - Re-evaluate in 7-10 days; consider ultrasound if lump persists.  Tension headache Headache linked to muscle tension in left trapezius. - Instructed to report any changes or worsening of headache. -can use tylenol  for pain. Hopefully with left side skin infection resolving trapezius tightness ha will resolve.   Possibility of temporal arteritis Left temporal pain suggests temporal arteritis. - Ordered sed rate to evaluate for temporal arteritis.  Follow up 7-10 days or sooner if needed  [employer], PA-C

## 2023-11-19 ENCOUNTER — Ambulatory Visit: Payer: Self-pay | Admitting: Medical

## 2024-01-07 ENCOUNTER — Other Ambulatory Visit: Payer: Self-pay | Admitting: Medical

## 2024-01-11 ENCOUNTER — Other Ambulatory Visit: Payer: Self-pay | Admitting: Cardiology

## 2024-02-02 ENCOUNTER — Other Ambulatory Visit: Payer: Self-pay | Admitting: Medical

## 2024-02-08 ENCOUNTER — Telehealth: Payer: Self-pay

## 2024-02-08 ENCOUNTER — Emergency Department (HOSPITAL_BASED_OUTPATIENT_CLINIC_OR_DEPARTMENT_OTHER)
Admission: EM | Admit: 2024-02-08 | Discharge: 2024-02-08 | Disposition: A | Discharge status: Home / Self Care | Attending: Emergency Medicine | Admitting: Emergency Medicine

## 2024-02-08 ENCOUNTER — Ambulatory Visit (INDEPENDENT_AMBULATORY_CARE_PROVIDER_SITE_OTHER): Admitting: Medical

## 2024-02-08 ENCOUNTER — Other Ambulatory Visit: Payer: Self-pay

## 2024-02-08 ENCOUNTER — Emergency Department (HOSPITAL_BASED_OUTPATIENT_CLINIC_OR_DEPARTMENT_OTHER)

## 2024-02-08 ENCOUNTER — Encounter: Payer: Self-pay | Admitting: Medical

## 2024-02-08 ENCOUNTER — Encounter (HOSPITAL_BASED_OUTPATIENT_CLINIC_OR_DEPARTMENT_OTHER): Payer: Self-pay | Admitting: Emergency Medicine

## 2024-02-08 VITALS — BP 140/80 | HR 70 | Temp 97.9°F | Resp 18 | Ht 69.0 in | Wt 197.2 lb

## 2024-02-08 DIAGNOSIS — Z79899 Other long term (current) drug therapy: Secondary | ICD-10-CM | POA: Diagnosis not present

## 2024-02-08 DIAGNOSIS — R7989 Other specified abnormal findings of blood chemistry: Secondary | ICD-10-CM | POA: Diagnosis not present

## 2024-02-08 DIAGNOSIS — Z7982 Long term (current) use of aspirin: Secondary | ICD-10-CM | POA: Insufficient documentation

## 2024-02-08 DIAGNOSIS — R350 Frequency of micturition: Secondary | ICD-10-CM

## 2024-02-08 DIAGNOSIS — Z7951 Long term (current) use of inhaled steroids: Secondary | ICD-10-CM | POA: Diagnosis not present

## 2024-02-08 DIAGNOSIS — I10 Essential (primary) hypertension: Secondary | ICD-10-CM | POA: Insufficient documentation

## 2024-02-08 DIAGNOSIS — R0609 Other forms of dyspnea: Secondary | ICD-10-CM | POA: Diagnosis not present

## 2024-02-08 DIAGNOSIS — R739 Hyperglycemia, unspecified: Secondary | ICD-10-CM | POA: Diagnosis not present

## 2024-02-08 DIAGNOSIS — J449 Chronic obstructive pulmonary disease, unspecified: Secondary | ICD-10-CM | POA: Insufficient documentation

## 2024-02-08 DIAGNOSIS — R300 Dysuria: Secondary | ICD-10-CM | POA: Diagnosis not present

## 2024-02-08 DIAGNOSIS — I5022 Chronic systolic (congestive) heart failure: Secondary | ICD-10-CM | POA: Diagnosis not present

## 2024-02-08 DIAGNOSIS — R0602 Shortness of breath: Secondary | ICD-10-CM | POA: Diagnosis not present

## 2024-02-08 LAB — CBC WITH DIFFERENTIAL/PLATELET
Abs Immature Granulocytes: 0.01 K/uL (ref 0.00–0.07)
Basophils Absolute: 0 K/uL (ref 0.0–0.1)
Basophils Relative: 1 %
Eosinophils Absolute: 0.1 K/uL (ref 0.0–0.5)
Eosinophils Relative: 1 %
HCT: 46.9 % (ref 39.0–52.0)
Hemoglobin: 16.2 g/dL (ref 13.0–17.0)
Immature Granulocytes: 0 %
Lymphocytes Relative: 31 %
Lymphs Abs: 2.2 K/uL (ref 0.7–4.0)
MCH: 31.6 pg (ref 26.0–34.0)
MCHC: 34.5 g/dL (ref 30.0–36.0)
MCV: 91.4 fL (ref 80.0–100.0)
Monocytes Absolute: 0.5 K/uL (ref 0.1–1.0)
Monocytes Relative: 7 %
Neutro Abs: 4.3 K/uL (ref 1.7–7.7)
Neutrophils Relative %: 60 %
Platelets: 230 K/uL (ref 150–400)
RBC: 5.13 MIL/uL (ref 4.22–5.81)
RDW: 12.5 % (ref 11.5–15.5)
WBC: 7.2 K/uL (ref 4.0–10.5)
nRBC: 0 % (ref 0.0–0.2)

## 2024-02-08 LAB — TROPONIN T, HIGH SENSITIVITY: Troponin T High Sensitivity: 21 ng/L — ABNORMAL HIGH (ref 0–19)

## 2024-02-08 LAB — TROPONIN I (HIGH SENSITIVITY): High Sens Troponin I: 35 ng/L (ref 2–17)

## 2024-02-08 LAB — BASIC METABOLIC PANEL WITH GFR
Anion gap: 12 (ref 5–15)
BUN: 12 mg/dL (ref 8–23)
CO2: 22 mmol/L (ref 22–32)
Calcium: 9.4 mg/dL (ref 8.9–10.3)
Chloride: 106 mmol/L (ref 98–111)
Creatinine, Ser: 0.87 mg/dL (ref 0.61–1.24)
GFR, Estimated: >60 mL/min (ref >60)
Glucose, Bld: 121 mg/dL — ABNORMAL HIGH (ref 70–99)
Potassium: 3.8 mmol/L (ref 3.5–5.1)
Sodium: 141 mmol/L (ref 135–145)

## 2024-02-08 LAB — POC URINALSYSI DIPSTICK (AUTOMATED)
Bilirubin, UA: NEGATIVE
Glucose, UA: NEGATIVE
Ketones, UA: NEGATIVE
Leukocytes, UA: NEGATIVE
Nitrite, UA: NEGATIVE
Protein, UA: NEGATIVE
Spec Grav, UA: 1.01 (ref 1.010–1.025)
Urobilinogen, UA: 0.2 U/dL
pH, UA: 5 (ref 5.0–8.0)

## 2024-02-08 LAB — BRAIN NATRIURETIC PEPTIDE: Pro B Natriuretic peptide (BNP): 181 pg/mL — ABNORMAL HIGH (ref 0.0–100.0)

## 2024-02-08 MED ORDER — SACUBITRIL-VALSARTAN 49-51 MG PO TABS: 1.0000 | ORAL_TABLET | Freq: Two times a day (BID) | ORAL | 3 refills | Status: DC

## 2024-02-08 MED ORDER — SULFAMETHOXAZOLE-TRIMETHOPRIM 800-160 MG PO TABS: 1.0000 | ORAL_TABLET | Freq: Two times a day (BID) | ORAL | 0 refills | Status: DC

## 2024-02-08 NOTE — Discharge Instructions (Addendum)
 Follow-up with your primary care provider and your cardiologist as previously scheduled.  Return to the emergency department if you experience a recurrence of your chest pain.

## 2024-02-08 NOTE — ED Provider Notes (Signed)
 [place] EMERGENCY DEPARTMENT AT [facility] Provider Note   CSN: [id number] Arrival date & time: 02/08/24  1727     Patient presents with: Abnormal Lab   Matthew Nixon is a 63 y.o. male.   63 year old male presenting with abnormal lab.  Patient was seen by his PCP today, he had an episode of chest pain on Thursday after climbing several flights of stairs, he sat down and was able to rest for a few minutes and the chest pain resolved and has not returned since.  He called to schedule an appointment with his primary care provider after this happened, however he was not able to be seen till today, that was when his PCP checked a troponin and it was found to be elevated at 35, there is no baseline for comparison.  He is active and exercises frequently, he denies chest pain with exercise but states that I do not push myself too hard.  He denies shortness of breath, lower extremity edema, abdominal pain, nausea/vomiting.  He has a history of CHF, cardiomyopathy, COPD.   Abnormal Lab      Prior to Admission medications   Medication Sig Start Date End Date Taking? Authorizing Provider  albuterol  (VENTOLIN  HFA) 108 (90 Base) MCG/ACT inhaler INHALE 2 PUFFS BY MOUTH EVERY 6 HOURS AS NEEDED 02/02/24   Saguier, [place], PA-C  allopurinol  (ZYLOPRIM ) 100 MG tablet Take 1 tablet (100 mg total) by mouth daily. 01/29/22   Chick Venetia BRAVO, MD  amLODipine  (NORVASC ) 10 MG tablet Take 1 tablet by mouth once daily 01/07/24   Saguier, [place], PA-C  aspirin 81 MG tablet Take 81 mg by mouth daily.    [provider]  budesonide -formoterol  (SYMBICORT ) 160-4.5 MCG/ACT inhaler Inhale 2 puffs into the lungs 2 (two) times daily. 01/20/23   Saguier, Dallas, PA-C  carvedilol  (COREG ) 25 MG tablet Take 1 tablet (25 mg total) by mouth 2 (two) times daily with a meal. 10/05/23   Saguier, [place], PA-C  diclofenac  Sodium (PENNSAID ) 2 % SOLN Place 2 Pump (40 mg total) onto the skin 2 (two) times daily.  02/27/22   Chick Venetia BRAVO, MD  digoxin  (LANOXIN ) 0.125 MG tablet Take 1 tablet by mouth once daily 01/07/24   Saguier, [place], PA-C  doxazosin  (CARDURA ) 8 MG tablet Take 1 tablet (8 mg total) by mouth daily. 01/24/21   Shlomo Wilbert SAUNDERS, MD  doxycycline  (VIBRA -TABS) 100 MG tablet Take 1 tablet (100 mg total) by mouth 2 (two) times daily. 11/18/23   Saguier, [place], PA-C  FLOVENT  HFA 220 MCG/ACT inhaler Inhale into the lungs. 04/16/21   [provider]  fluticasone  (FLONASE ) 50 MCG/ACT nasal spray Place 2 sprays into both nostrils daily. 10/21/17   Saguier, Dallas, PA-C  fluticasone  (FLONASE ) 50 MCG/ACT nasal spray Place 2 sprays into both nostrils daily. 08/15/22   Saguier, Dallas, PA-C  fluticasone  (FLONASE ) 50 MCG/ACT nasal spray Place 2 sprays into both nostrils daily. 10/05/23   Saguier, Dallas, PA-C  furosemide  (LASIX ) 20 MG tablet Take 0.5 tablets (10 mg total) by mouth daily. 10/05/23   Saguier, Dallas, PA-C  gabapentin  (NEURONTIN ) 100 MG capsule Take 1 capsule by mouth at bedtime 10/05/23   Saguier, Dallas, PA-C  hydrALAZINE  (APRESOLINE ) 100 MG tablet Take 1 tablet (100 mg total) by mouth 3 (three) times daily. 10/05/23   Saguier, Dallas, PA-C  hydrOXYzine  (ATARAX /VISTARIL ) 10 MG tablet 1-2 tab po q 8 hours as needed itching. 02/09/17   Saguier, Dallas, PA-C  ipratropium (ATROVENT  HFA) 17 MCG/ACT  inhaler Inhale 2 puffs into the lungs every 6 (six) hours as needed for wheezing. 10/30/16   Saguier, Dallas, PA-C  isosorbide  mononitrate (IMDUR ) 60 MG 24 hr tablet Take 1 tablet (60 mg total) by mouth daily. 10/05/23   Saguier, Dallas, PA-C  ISOSORBIDE  MONONITRATE PO Take 60 mg by mouth daily.    [provider]  levocetirizine (XYZAL ) 5 MG tablet Take 1 tablet (5 mg total) by mouth every evening. 10/05/23   Saguier, Dallas, PA-C  meclizine  (ANTIVERT ) 12.5 MG tablet Take 1 tablet (12.5 mg total) by mouth 3 (three) times daily as needed for dizziness. 12/02/17   Saguier, Dallas, PA-C  olopatadine   (PATANOL) 0.1 % ophthalmic solution Place 1 drop into both eyes 2 (two) times daily. 09/24/21   Saguier, Dallas, PA-C  omeprazole  (PRILOSEC) 40 MG capsule Take 1 capsule (40 mg total) by mouth daily. 03/18/21   Abran Norleen SAILOR, MD  ranitidine  (ZANTAC ) 150 MG capsule Take 1 capsule (150 mg total) by mouth 2 (two) times daily. Patient taking differently: Take 150 mg by mouth as needed. 03/30/18   Abran Norleen SAILOR, MD  rosuvastatin  (CRESTOR ) 5 MG tablet Take 1 tablet by mouth once daily 01/12/24   Shlomo Wilbert SAUNDERS, MD  sacubitril -valsartan  (ENTRESTO ) 49-51 MG Take 1 tablet by mouth 2 (two) times daily. 02/08/24   Saguier, Dallas, PA-C  spironolactone  (ALDACTONE ) 25 MG tablet Take 1 tablet (25 mg total) by mouth daily. 10/05/23   Saguier, Dallas, PA-C  sulfamethoxazole -trimethoprim  (BACTRIM  DS) 800-160 MG tablet Take 1 tablet by mouth 2 (two) times daily. 02/08/24   Saguier, Dallas, PA-C  terbinafine  (LAMISIL ) 250 MG tablet Take by mouth. 04/16/21   [provider]    Allergies: Patient has no known allergies.    Review of Systems  Updated Vital Signs  Vitals:   02/08/24 1745 02/08/24 1749 02/08/24 1845  BP: (!) 165/87  (!) 158/83  Pulse: 79  67  Resp: 18    Temp: 98.6 F (37 C)    TempSrc: Oral    SpO2: 97%  97%  Weight:  89.4 kg   Height:  5' 9 (1.753 m)      Physical Exam Vitals and nursing note reviewed.  HENT:     Head: Normocephalic.  Eyes:     Extraocular Movements: Extraocular movements intact.  Cardiovascular:     Rate and Rhythm: Normal rate and regular rhythm.  Pulmonary:     Effort: Pulmonary effort is normal.     Breath sounds: Normal breath sounds.  Abdominal:     Palpations: Abdomen is soft.  Musculoskeletal:     Cervical back: Normal range of motion.     Right lower leg: No edema.     Left lower leg: No edema.     Comments: Moves all extremities spontaneously without difficulty  Skin:    General: Skin is warm and dry.  Neurological:     Mental Status: He is  alert and oriented to person, place, and time.     (all labs ordered are listed, but only abnormal results are displayed) Labs Reviewed  BASIC METABOLIC PANEL WITH GFR - Abnormal; Notable for the following components:      Result Value   Glucose, Bld 121 (*)    All other components within normal limits  TROPONIN T, HIGH SENSITIVITY - Abnormal; Notable for the following components:   Troponin T High Sensitivity 21 (*)    All other components within normal limits  CBC WITH DIFFERENTIAL/PLATELET  EKG: None  Radiology: DG Chest 2 View Result Date: 02/08/2024 CLINICAL DATA:  Shortness of breath EXAM: CHEST - 2 VIEW COMPARISON:  10/06/2023 FINDINGS: The heart size and mediastinal contours are within normal limits. Both lungs are clear. The visualized skeletal structures are unremarkable. IMPRESSION: No active cardiopulmonary disease. Electronically Signed   By: Luke Bun M.D.   On: 02/08/2024 18:25     Procedures   Medications Ordered in the ED - No data to display                                  Medical Decision Making This patient presents to the ED for concern of elevated troponin, this involves an extensive number of treatment options, and is a complaint that carries with it a high risk of complications and morbidity.  The differential diagnosis includes STEMI versus NSTEMI, CHF, CKD, PE.   Co morbidities that complicate the patient evaluation  Cardiomyopathy, COPD   Additional history obtained:  Additional history obtained from record review External records from outside source obtained and reviewed including PCP note from earlier today   Lab Tests:  I Ordered, and personally interpreted labs.  The pertinent results include: CBC within normal limits, BMP unremarkable.  Troponin 21, was 35 earlier today at his PCP appointment.   Imaging Studies ordered:  I ordered imaging studies including CXR  I independently visualized and interpreted imaging which showed  No active cardiopulmonary disease.  I agree with the radiologist interpretation   Cardiac Monitoring: / EKG:  The patient was maintained on a cardiac monitor.  I personally viewed and interpreted the cardiac monitored which showed an underlying rhythm of: NSR   Problem List / ED Course / Critical interventions / Medication management I have reviewed the patients home medicines and have made adjustments as needed   Test / Admission - Considered:  Physical exam is largely unremarkable as above, patient is mildly hypertensive but otherwise vitals are reassuring.  EKG stable from previous, reviewed by Dr. Ruthe. Patient was sent from his PCPs office today after having an abnormal troponin of 35, however I have no baseline for comparison.  Patient's second troponin today is 21, I have a very low suspicion for ACS as cause of his elevated troponin today, I suspect that at baseline given his history of CHF and cardiomyopathy his troponin is likely mildly elevated.  He is not currently having chest pain, he had 1 episode of chest pain on Thursday after walking up several flights of stairs, this resolved on its own after several minutes.  I discussed these findings with the patient and his wife, I advised that if he continues to experience chest pain on exertion he should follow-up with his cardiologist sooner rather than later, I encouraged him to also discuss these findings with his primary care provider.  He has an appointment with his cardiologist on September 4th.  He voiced understanding and is in agreement with this plan.  Strict return precautions discussed.  He is appropriate for discharge at this time.    Amount and/or Complexity of Data Reviewed Labs: ordered. Radiology: ordered.        Final diagnoses:  Elevated troponin    ED Discharge Orders     None          Glendia Rocky LOISE DEVONNA 02/08/24 1856    Ruthe Cornet, DO 02/08/24 1859

## 2024-02-08 NOTE — Patient Instructions (Addendum)
 Congestive heart failure  with htn with known prior left bundle branch block/not new.(EKG done today and compared to prior) Dyspnea on exertion with CHF and left bundle branch block. EKG shows sinus rhythm with left bundle branch block, consistent with previous EKG. - Order chest x-ray to assess volume status and check for pulmonary edema. - Obtain BNP level. - Perform EKG and compare with previous. - Check troponin level.(If were to be elevated would need repeat in ED to confirm not rising). - Continue current medications. - Offere referarl back quicker  with  cardiologist  than already scheduled  if symptoms worsen or based on test results. - Advise emergency department evaluation for constant dyspnea or chest pain. Or if abnormal lab. -continue current meds and refilled entresto   Urinary tract infection with possible early prostatitis Frequent urination and dysuria. Differential includes urinary tract infection and early prostatitis. - Obtain urine sample for urinalysis and urine culture. - Check PSA level. - Prescribe Bactrim  DS twice daily pending urine study results. - Order metabolic panel and check A1C.  Follow up date to be determined after lab review

## 2024-02-08 NOTE — ED Triage Notes (Signed)
 Pt reports he was seen PCP for blood work due to occassional SOB, pt was called and advised to come to ED due to elevated troponin, denies chest pain and SOB at this time

## 2024-02-08 NOTE — Progress Notes (Addendum)
 Subjective:    Patient ID: Matthew Nixon, male    DOB: 06-17-1961, 63 y.o.   MRN: [id number]  HPI Pt in for follow up.  Pt has cardiology appointment in September/less than a month. Pt states appt on Sept 4th.   .  Chronic systolic CHF NYHA class IIb -he appears euvolemic on exam today -weight remains stable -continue prescription drug management with Digoxin  0.125mg  daily, Carvedilol  25mg  BID, Lasix  10mg  daily, Hydralazine  100mg  TID, Imdur  60mg  daily, Entresto  49-15mg  BID, spiro 25mg  daily with PRN refills -I have personally reviewed and interpreted outside labs performed by patient's PCP which showed serum creatinine 1.1 and potassium 4.9 on 08/15/2022   HTN -BP controlled on exam today -Continue prescription drug management with amlodipine  10 mg daily, carvedilol  25 mg twice daily, doxazosin  8 mg daily, hydralazine  100 mg 3 times daily, Imdur  60 mg daily and spironolactone  25 mg daily and Entresto  49-51 mg twice daily with as needed refills  OSA  -intolerant to CPAP -he is waking up gasping for breath -we discussed the Inspire device but he is not interested   Discussed with pt and he states on same meds. Bp is well controlled.   He still does not want referral to dicuss inspire. 2 years ago tried new cpap with equipment and could not sleep.   No cardiac/chest pain  or neurologic signs/symptoms presently during the exam.  Pt plans to go to [place] in December for 5-6 months.   Pt declines vaccines today. He states may get pneumonia vaccine before he leaves for [place]. Currently declines shingrix and tdap vaccine.(Advised get shingrix and tdap thur pharmacy if chooses to get)   Pt had coloquard  negative in the past.    Pt states 2 days ago he had mild pain on urination. Some increased frequency as well. No blood seen.  Pt state recently going up stairs mild shortness of breath going up stairs. Pt states when get to top recovers quickly. This is subtle changes. States only  faint low level transient chest pain. This happened past Thursday last.   Last EF in 2022 was showed 30-35%    Matthew Nixon is a 63 year old male with congestive heart failure who presents with dyspnea on exertion and frequent urination.  He has been experiencing frequent urination with mild dysuria for the past couple of days. He is concerned about these urinary symptoms and is seeking evaluation.  He also has dyspnea on exertion, which is concerning given his history of congestive heart failure. He experienced slight chest pain on Thursday, which has since resolved. No constant shortness of breath or chest pain at rest.  He recalls a previous incident during atttemped [place] cardiac stress test where he felt uncomfortable due to the cold environment and the need to urinate, which led to an incomplete test. He is apprehensive about repeating the test under similar conditions and prefers to explore alternative options if possible.    Review of Systems  Constitutional:  Negative for chills, fatigue and fever.  HENT:  Negative for congestion.   Respiratory:  Negative for chest tightness, shortness of breath and wheezing.        No dyspnea presently.  Cardiovascular:  Negative for chest pain and palpitations.  Gastrointestinal:  Negative for abdominal pain.  Genitourinary:  Negative for dysuria and frequency.  Musculoskeletal:  Negative for back pain and myalgias.  Skin:  Negative for rash.  Neurological:  Negative for dizziness, syncope, weakness and light-headedness.  Hematological:  Negative for adenopathy. Does not bruise/bleed easily.  Psychiatric/Behavioral:  Negative for behavioral problems, decreased concentration and sleep disturbance. The patient is not nervous/anxious.     Past Medical History:  Diagnosis Date   Allergy     CAD (coronary artery disease), native coronary artery    25-49% prox to mid RCA by coronary CTA 11/2020   Cardiomyopathy [facility])    DCM with EF 32% on echo  2014 and no ischemia on nuclear stress test.  EF improved to 40-45% by echo 02/2017   Chronic back pain    low back   Chronic systolic CHF (congestive heart failure), NYHA class 2 (HCC) 08/07/2016   COPD (chronic obstructive pulmonary disease) (HCC)    Dyspnea    with exertion   EKG, abnormal    history of left bundle block since 2002 per pt   GERD (gastroesophageal reflux disease)    Headache    Hypertension    LBBB (left bundle branch block)    OSA on CPAP 08/07/2016   Oxygen  deficiency    Pain    right side pain for many years   PTSD (post-traumatic stress disorder)    Sleep apnea      Social History   Socioeconomic History   Marital status: Married    Spouse name: Not on file   Number of children: Not on file   Years of education: Not on file   Highest education level: Not on file  Occupational History   Not on file  Tobacco Use   Smoking status: Never   Smokeless tobacco: Never  Vaping Use   Vaping status: Never Used  Substance and Sexual Activity   Alcohol use: No   Drug use: No   Sexual activity: Not on file  Other Topics Concern   Not on file  Social History Narrative   2 years college    Social Drivers of Health   Financial Resource Strain: Not on file  Food Insecurity: Not on file  Transportation Needs: Not on file  Physical Activity: Not on file  Stress: Not on file  Social Connections: Not on file  Intimate Partner Violence: Not on file    Past Surgical History:  Procedure Laterality Date   ANKLE SURGERY Right    CARDIAC CATHETERIZATION  yrs ago   ESOPHAGOGASTRODUODENOSCOPY (EGD) WITH PROPOFOL  N/A 01/13/2017   Procedure: ESOPHAGOGASTRODUODENOSCOPY (EGD) WITH PROPOFOL ;  Surgeon: Abran Norleen SAILOR, MD;  Location: [facility];  Service: Endoscopy;  Laterality: N/A;   HAND SURGERY     Right   HAND SURGERY     Left carpel tunnel   THUMB ARTHROSCOPY Left     Family History  Problem Relation Age of Onset   Heart disease Mother    Heart disease  Father    Colon cancer Neg Hx    Esophageal cancer Neg Hx    Pancreatic cancer Neg Hx    Stomach cancer Neg Hx     No Known Allergies  Current Outpatient Medications on File Prior to Visit  Medication Sig Dispense Refill   albuterol  (VENTOLIN  HFA) 108 (90 Base) MCG/ACT inhaler INHALE 2 PUFFS BY MOUTH EVERY 6 HOURS AS NEEDED 18 g 0   allopurinol  (ZYLOPRIM ) 100 MG tablet Take 1 tablet (100 mg total) by mouth daily. 30 tablet 3   amLODipine  (NORVASC ) 10 MG tablet Take 1 tablet by mouth once daily 90 tablet 0   aspirin 81 MG tablet Take 81 mg by mouth daily.     budesonide -formoterol  (SYMBICORT )  160-4.5 MCG/ACT inhaler Inhale 2 puffs into the lungs 2 (two) times daily. 1 each 12   carvedilol  (COREG ) 25 MG tablet Take 1 tablet (25 mg total) by mouth 2 (two) times daily with a meal. 180 tablet 0   diclofenac  Sodium (PENNSAID ) 2 % SOLN Place 2 Pump (40 mg total) onto the skin 2 (two) times daily. 112 g 2   digoxin  (LANOXIN ) 0.125 MG tablet Take 1 tablet by mouth once daily 90 tablet 0   doxazosin  (CARDURA ) 8 MG tablet Take 1 tablet (8 mg total) by mouth daily. 90 tablet 3   doxycycline  (VIBRA -TABS) 100 MG tablet Take 1 tablet (100 mg total) by mouth 2 (two) times daily. 20 tablet 0   FLOVENT  HFA 220 MCG/ACT inhaler Inhale into the lungs.     fluticasone  (FLONASE ) 50 MCG/ACT nasal spray Place 2 sprays into both nostrils daily. 16 g 1   fluticasone  (FLONASE ) 50 MCG/ACT nasal spray Place 2 sprays into both nostrils daily. 16 g 1   fluticasone  (FLONASE ) 50 MCG/ACT nasal spray Place 2 sprays into both nostrils daily. 16 g 1   furosemide  (LASIX ) 20 MG tablet Take 0.5 tablets (10 mg total) by mouth daily. 45 tablet 0   gabapentin  (NEURONTIN ) 100 MG capsule Take 1 capsule by mouth at bedtime 30 capsule 0   hydrALAZINE  (APRESOLINE ) 100 MG tablet Take 1 tablet (100 mg total) by mouth 3 (three) times daily. 270 tablet 0   hydrOXYzine  (ATARAX /VISTARIL ) 10 MG tablet 1-2 tab po q 8 hours as needed itching. 30  tablet 0   ipratropium (ATROVENT  HFA) 17 MCG/ACT inhaler Inhale 2 puffs into the lungs every 6 (six) hours as needed for wheezing. 1 Inhaler 3   isosorbide  mononitrate (IMDUR ) 60 MG 24 hr tablet Take 1 tablet (60 mg total) by mouth daily. 90 tablet 0   ISOSORBIDE  MONONITRATE PO Take 60 mg by mouth daily.     levocetirizine (XYZAL ) 5 MG tablet Take 1 tablet (5 mg total) by mouth every evening. 30 tablet 0   meclizine  (ANTIVERT ) 12.5 MG tablet Take 1 tablet (12.5 mg total) by mouth 3 (three) times daily as needed for dizziness. 30 tablet 0   olopatadine  (PATANOL) 0.1 % ophthalmic solution Place 1 drop into both eyes 2 (two) times daily. 5 mL 12   omeprazole  (PRILOSEC) 40 MG capsule Take 1 capsule (40 mg total) by mouth daily. 90 capsule 3   ranitidine  (ZANTAC ) 150 MG capsule Take 1 capsule (150 mg total) by mouth 2 (two) times daily. (Patient taking differently: Take 150 mg by mouth as needed.) 60 capsule 6   rosuvastatin  (CRESTOR ) 5 MG tablet Take 1 tablet by mouth once daily 60 tablet 0   sacubitril -valsartan  (ENTRESTO ) 49-51 MG Take 1 tablet by mouth 2 (two) times daily. 180 tablet 3   spironolactone  (ALDACTONE ) 25 MG tablet Take 1 tablet (25 mg total) by mouth daily. 90 tablet 0   terbinafine  (LAMISIL ) 250 MG tablet Take by mouth.     No current facility-administered medications on file prior to visit.    BP (!) 140/80   Pulse 70   Temp 97.9 F (36.6 C)   Resp 18   Ht 5' 9 (1.753 m)   Wt 197 lb 3.2 oz (89.4 kg)   SpO2 99%   BMI 29.12 kg/m        Objective:   Physical Exam  General Mental Status- Alert. General Appearance- Not in acute distress.   Skin General: Color- Normal Color. Moisture-  Normal Moisture.  Neck No carotid bruits. No JVD.  Chest and Lung Exam Auscultation: Breath Sounds:-CTA  Cardiovascular Auscultation:Rythm- RRR Murmurs & Other Heart Sounds:Auscultation of the heart reveals- No Murmurs.  Abdomen Inspection:-Inspeection  Normal. Palpation/Percussion:Note:No mass. Palpation and Percussion of the abdomen reveal- Non Tender, Non Distended + BS, no rebound or guarding.    Neurologic Cranial Nerve exam:- CN III-XII intact(No nystagmus), symmetric smile. Strength:- 5/5 equal and symmetric strength both upper and lower extremities.   Lower ext- no pedal edema, negative homans signs. Symmetric calfes.    Assessment & Plan:  [phone]  Patient Instructions  Congestive heart failure  with htn with known prior left bundle branch block/not new.(EKG done today and compared to prior) Dyspnea on exertion with CHF and left bundle branch block. EKG shows sinus rhythm with left bundle branch block, consistent with previous EKG. - Order chest x-ray to assess volume status and check for pulmonary edema. - Obtain BNP level. - Perform EKG and compare with previous. - Check troponin level.(If were to be elevated would need repeat in ED to confirm not rising). - Continue current medications. - Offere referarl back quicker  with  cardiologist  than already scheduled  if symptoms worsen or based on test results. - Advise emergency department evaluation for constant dyspnea or chest pain. Or if abnormal lab. -continue current meds and refilled entresto   Urinary tract infection with possible early prostatitis Frequent urination and dysuria. Differential includes urinary tract infection and early prostatitis. - Obtain urine sample for urinalysis and urine culture. - Check PSA level. - Prescribe Bactrim  DS twice daily pending urine study results. - Order metabolic panel and check A1C.  Follow up date to be determined after lab review    Troponin was elevated. Advised pt to go to ED for further evaluation. Possible demand ischemia due to chf but troponin  needs to trended. Discussed case with ED MD today  before pt arrived to ED.  Time spent with patient today was 50  minutes which consisted of chart revdew, discussing  diagnosis, work up, treatment and documentation.

## 2024-02-08 NOTE — Telephone Encounter (Signed)
 CRITICAL VALUE STICKER  CRITICAL VALUE: Troponin- 35

## 2024-02-09 ENCOUNTER — Ambulatory Visit: Payer: Self-pay | Admitting: Medical

## 2024-02-09 DIAGNOSIS — R350 Frequency of micturition: Secondary | ICD-10-CM

## 2024-02-09 DIAGNOSIS — R319 Hematuria, unspecified: Secondary | ICD-10-CM

## 2024-02-09 DIAGNOSIS — R300 Dysuria: Secondary | ICD-10-CM

## 2024-02-09 LAB — COMPLETE METABOLIC PANEL WITHOUT GFR
AG Ratio: 1.8 (calc) (ref 1.0–2.5)
ALT: 39 U/L (ref 9–46)
AST: 24 U/L (ref 10–35)
Albumin: 4.8 g/dL (ref 3.6–5.1)
Alkaline phosphatase (APISO): 64 U/L (ref 35–144)
BUN: 13 mg/dL (ref 7–25)
CO2: 27 mmol/L (ref 20–32)
Calcium: 9.8 mg/dL (ref 8.6–10.3)
Chloride: 104 mmol/L (ref 98–110)
Creat: 0.98 mg/dL (ref 0.70–1.35)
Globulin: 2.6 g/dL (ref 1.9–3.7)
Glucose, Bld: 87 mg/dL (ref 65–99)
Potassium: 4.6 mmol/L (ref 3.5–5.3)
Sodium: 141 mmol/L (ref 135–146)
Total Bilirubin: 0.5 mg/dL (ref 0.2–1.2)
Total Protein: 7.4 g/dL (ref 6.1–8.1)

## 2024-02-09 LAB — URINE CULTURE
MICRO NUMBER:: 16845640
Result:: NO GROWTH
SPECIMEN QUALITY:: ADEQUATE

## 2024-02-09 LAB — HEMOGLOBIN A1C: Hgb A1c MFr Bld: 5.8 % (ref 4.6–6.5)

## 2024-02-09 LAB — PSA: PSA: 0.9 ng/mL (ref 0.10–4.00)

## 2024-02-09 NOTE — Addendum Note (Signed)
 Addended by: DORINA DALLAS HERO on: 02/09/2024 05:29 PM   Modules accepted: Orders

## 2024-02-19 ENCOUNTER — Encounter: Payer: Self-pay | Admitting: Urology

## 2024-02-19 ENCOUNTER — Ambulatory Visit: Admitting: Urology

## 2024-02-19 VITALS — BP 176/84 | HR 73 | Ht 69.0 in | Wt 197.0 lb

## 2024-02-19 DIAGNOSIS — R3129 Other microscopic hematuria: Secondary | ICD-10-CM | POA: Diagnosis not present

## 2024-02-19 DIAGNOSIS — R351 Nocturia: Secondary | ICD-10-CM

## 2024-02-19 DIAGNOSIS — R319 Hematuria, unspecified: Secondary | ICD-10-CM

## 2024-02-19 LAB — URINALYSIS, ROUTINE W REFLEX MICROSCOPIC
Bilirubin, UA: NEGATIVE
Glucose, UA: NEGATIVE
Ketones, UA: NEGATIVE
Leukocytes,UA: NEGATIVE
Nitrite, UA: NEGATIVE
Protein,UA: NEGATIVE
Specific Gravity, UA: 1.025 (ref 1.005–1.030)
Urobilinogen, Ur: 0.2 mg/dL (ref 0.2–1.0)
pH, UA: 5.5 (ref 5.0–7.5)

## 2024-02-19 LAB — MICROSCOPIC EXAMINATION
Casts: POSITIVE /LPF — AB
Mucus, UA: POSITIVE — AB

## 2024-02-19 NOTE — Progress Notes (Signed)
 Assessment: 1. Hematuria - dipstick only   2. Nocturia     Plan: I personally reviewed the patient's chart including provider notes, lab results. I advised him that there is no evidence of microscopic hematuria on his urinalysis today.  I discussed the difference between the results on a dipstick urine and microscopic urine.  I advised him that the microscopic urine is a more accurate indicator of abnormal amounts of blood in the urine and that the need for evaluation is based on the microscopic findings rather than the dipstick. Return to office in 1 month for repeat urinalysis.   Chief Complaint:  Chief Complaint  Patient presents with   Hematuria    History of Present Illness:  Matthew Nixon is a 63 y.o. male who is seen in consultation from [place], [place], PA-C for evaluation of hematuria and lower urinary tract symptoms.  He reported nocturia 1-3x.  He associates this with his fluid intake and use of diuretics.  No dysuria or gross hematuria.  He was treated with Bactrim  for possible prostatitis.  Dipstick U/A from 8/25: small blood Urine culture:  no growth PSA 8/25:  0.90  No history of UTI's or prostatitis. No history of kidney stones.   Past Medical History:  Past Medical History:  Diagnosis Date   Allergy     CAD (coronary artery disease), native coronary artery    25-49% prox to mid RCA by coronary CTA 11/2020   Cardiomyopathy [facility])    DCM with EF 32% on echo 2014 and no ischemia on nuclear stress test.  EF improved to 40-45% by echo 02/2017   Chronic back pain    low back   Chronic systolic CHF (congestive heart failure), NYHA class 2 (HCC) 08/07/2016   COPD (chronic obstructive pulmonary disease) (HCC)    Dyspnea    with exertion   EKG, abnormal    history of left bundle block since 2002 per pt   GERD (gastroesophageal reflux disease)    Headache    Hypertension    LBBB (left bundle branch block)    OSA on CPAP 08/07/2016   Oxygen  deficiency    Pain     right side pain for many years   PTSD (post-traumatic stress disorder)    Sleep apnea     Past Surgical History:  Past Surgical History:  Procedure Laterality Date   ANKLE SURGERY Right    CARDIAC CATHETERIZATION  yrs ago   ESOPHAGOGASTRODUODENOSCOPY (EGD) WITH PROPOFOL  N/A 01/13/2017   Procedure: ESOPHAGOGASTRODUODENOSCOPY (EGD) WITH PROPOFOL ;  Surgeon: Abran Norleen SAILOR, MD;  Location: [facility];  Service: Endoscopy;  Laterality: N/A;   HAND SURGERY     Right   HAND SURGERY     Left carpel tunnel   THUMB ARTHROSCOPY Left     Allergies:  No Known Allergies  Family History:  Family History  Problem Relation Age of Onset   Heart disease Mother    Heart disease Father    Colon cancer Neg Hx    Esophageal cancer Neg Hx    Pancreatic cancer Neg Hx    Stomach cancer Neg Hx     Social History:  Social History   Tobacco Use   Smoking status: Never   Smokeless tobacco: Never  Vaping Use   Vaping status: Never Used  Substance Use Topics   Alcohol use: No   Drug use: No    Review of symptoms:  Constitutional:  Negative for unexplained weight loss, night sweats, fever, chills ENT:  Negative for nose bleeds, sinus pain, painful swallowing CV:  Negative for chest pain, shortness of breath, exercise intolerance, palpitations, loss of consciousness Resp:  Negative for cough, wheezing, shortness of breath GI:  Negative for nausea, vomiting, diarrhea, bloody stools GU:  Positives noted in HPI; otherwise negative for gross hematuria, dysuria, urinary incontinence Neuro:  Negative for seizures, poor balance, limb weakness, slurred speech Psych:  Negative for lack of energy, depression, anxiety Endocrine:  Negative for polydipsia, polyuria, symptoms of hypoglycemia (dizziness, hunger, sweating) Hematologic:  Negative for anemia, purpura, petechia, prolonged or excessive bleeding, use of anticoagulants  Allergic:  Negative for difficulty breathing or choking as a result of  exposure to anything; no shellfish allergy ; no allergic response (rash/itch) to materials, foods  Physical exam: BP (!) 176/84   Pulse 73   Ht 5' 9 (1.753 m)   Wt 197 lb (89.4 kg)   BMI 29.09 kg/m  GENERAL APPEARANCE:  Well appearing, well developed, well nourished, NAD HEENT: Atraumatic, Normocephalic, oropharynx clear. NECK: Supple without lymphadenopathy or thyromegaly. LUNGS: Clear to auscultation bilaterally. HEART: Regular Rate and Rhythm without murmurs, gallops, or rubs. ABDOMEN: Soft, non-tender, No Masses. EXTREMITIES: Moves all extremities well.  Without clubbing, cyanosis, or edema. NEUROLOGIC:  Alert and oriented x 3, normal gait, CN II-XII grossly intact.  MENTAL STATUS:  Appropriate. BACK:  Non-tender to palpation.  No CVAT SKIN:  Warm, dry and intact. GU: Penis:  uncircumcised Meatus: Normal Scrotum: normal, no masses Testis: normal without masses bilaterally Prostate: 40 g, NT, no nodules Rectum: Normal tone,  no masses or tenderness     Results: U/A: 0-5 WBCs, 0-2 RBCs

## 2024-02-25 ENCOUNTER — Encounter: Payer: Self-pay | Admitting: Cardiology

## 2024-02-25 ENCOUNTER — Ambulatory Visit: Attending: Cardiology | Admitting: Cardiology

## 2024-02-25 VITALS — BP 172/88 | HR 76 | Ht 69.0 in | Wt 196.8 lb

## 2024-02-25 DIAGNOSIS — E785 Hyperlipidemia, unspecified: Secondary | ICD-10-CM | POA: Insufficient documentation

## 2024-02-25 DIAGNOSIS — I2510 Atherosclerotic heart disease of native coronary artery without angina pectoris: Secondary | ICD-10-CM | POA: Insufficient documentation

## 2024-02-25 DIAGNOSIS — Z79899 Other long term (current) drug therapy: Secondary | ICD-10-CM | POA: Insufficient documentation

## 2024-02-25 DIAGNOSIS — G4733 Obstructive sleep apnea (adult) (pediatric): Secondary | ICD-10-CM | POA: Diagnosis not present

## 2024-02-25 DIAGNOSIS — I10 Essential (primary) hypertension: Secondary | ICD-10-CM | POA: Diagnosis not present

## 2024-02-25 DIAGNOSIS — I428 Other cardiomyopathies: Secondary | ICD-10-CM | POA: Diagnosis not present

## 2024-02-25 DIAGNOSIS — I5022 Chronic systolic (congestive) heart failure: Secondary | ICD-10-CM | POA: Insufficient documentation

## 2024-02-25 MED ORDER — FUROSEMIDE 20 MG PO TABS: 10.0000 mg | ORAL_TABLET | Freq: Every day | ORAL | 3 refills | Status: AC

## 2024-02-25 MED ORDER — AMLODIPINE BESYLATE 10 MG PO TABS
10.0000 mg | ORAL_TABLET | Freq: Every day | ORAL | 3 refills | Status: AC
Start: 2024-02-25 — End: ?

## 2024-02-25 MED ORDER — ISOSORBIDE MONONITRATE ER 60 MG PO TB24: 60.0000 mg | ORAL_TABLET | Freq: Every day | ORAL | 3 refills | Status: DC

## 2024-02-25 MED ORDER — CARVEDILOL 25 MG PO TABS: 25.0000 mg | ORAL_TABLET | Freq: Two times a day (BID) | ORAL | 3 refills | Status: AC

## 2024-02-25 MED ORDER — DOXAZOSIN MESYLATE 8 MG PO TABS: 8.0000 mg | ORAL_TABLET | Freq: Every day | ORAL | 3 refills | Status: DC

## 2024-02-25 MED ORDER — SPIRONOLACTONE 25 MG PO TABS: 25.0000 mg | ORAL_TABLET | Freq: Every day | ORAL | 3 refills | Status: AC

## 2024-02-25 MED ORDER — EMPAGLIFLOZIN 10 MG PO TABS: 10.0000 mg | ORAL_TABLET | Freq: Every day | ORAL | 3 refills | Status: AC

## 2024-02-25 MED ORDER — ROSUVASTATIN CALCIUM 5 MG PO TABS: 5.0000 mg | ORAL_TABLET | Freq: Every day | ORAL | 3 refills | Status: DC

## 2024-02-25 MED ORDER — DIGOXIN 125 MCG PO TABS: 125.0000 ug | ORAL_TABLET | Freq: Every day | ORAL | 3 refills | Status: AC

## 2024-02-25 MED ORDER — SACUBITRIL-VALSARTAN 97-103 MG PO TABS: 1.0000 | ORAL_TABLET | Freq: Two times a day (BID) | ORAL | 3 refills | Status: AC

## 2024-02-25 NOTE — Patient Instructions (Signed)
 Medication Instructions:  Please START Jardiance  10 mg daily.  Please INCREASE your dose of Entresto  to 97-103 twice a day.  *If you need a refill on your cardiac medications before your next appointment, please call your pharmacy*  Lab Work: Please complete a BMET in one week. You do not have to be fasting and you can walk into any [facility] to complete these labs.  If you have labs (blood work) drawn today and your tests are completely normal, you will receive your results only by: MyChart Message (if you have MyChart) OR A paper copy in the mail If you have any lab test that is abnormal or we need to change your treatment, we will call you to review the results.  Testing/Procedures: Your physician has requested that you have an echocardiogram in early November 2025. Echocardiography is a painless test that uses sound waves to create images of your heart. It provides your doctor with information about the size and shape of your heart and how well your heart's chambers and valves are working. This procedure takes approximately one hour. There are no restrictions for this procedure. Please do NOT wear cologne, perfume, aftershave, or lotions (deodorant is allowed). Please arrive 15 minutes prior to your appointment time.  Please note: We ask at that you not bring children with you during ultrasound (echo/ vascular) testing. Due to room size and safety concerns, children are not allowed in the ultrasound rooms during exams. Our front office staff cannot provide observation of children in our lobby area while testing is being conducted. An adult accompanying a patient to their appointment will only be allowed in the ultrasound room at the discretion of the ultrasound technician under special circumstances. We apologize for any inconvenience.   Follow-Up: At [facility], you and your health needs are our priority.  As part of our continuing mission to provide you with exceptional heart  care, our providers are all part of one team.  This team includes your primary Cardiologist (physician) and Advanced Practice Providers or APPs (Physician Assistants and Nurse Practitioners) who all work together to provide you with the care you need, when you need it.  Your next appointment:   2 month(s)  Provider:   Wilbert Bihari, MD    We recommend signing up for the patient portal called MyChart.  Sign up information is provided on this After Visit Summary.  MyChart is used to connect with patients for Virtual Visits (Telemedicine).  Patients are able to view lab/test results, encounter notes, upcoming appointments, etc.  Non-urgent messages can be sent to your provider as well.   To learn more about what you can do with MyChart, go to [email or web address].   Other Instructions Please complete a blood pressure log at home. Check your  blood pressure twice a day, once at lunch and once at dinner. Write down each reading along with the date and time of the reading, as well as the heart rate if your machine provides it. Then, you can send a picture of the blood pressure log over MyChart, drop off the list of readings to our front desk, or call our main phone number to give the readings over the phone.

## 2024-02-25 NOTE — Progress Notes (Signed)
 Date:  02/25/2024   ID:  Thersia Hanly, DOB 1960-10-23, MRN [id number]   PCP:  Dorina Loving, PA-C  Cardiologist:  Wilbert Bihari, MD Electrophysiologist:  None   Chief Complaint:  DCM, CHF, HTN, OSA  History of Present Illness:    Matthew Nixon is a 63 y.o. male with a hx of DCM with EF 32% (echo 2014) and repeat echo 02/2017 with improved LVF with EF 40-45% and no ischemia on nuclear stress, chronic systolic heart failure NYHA class II, COPD, hypertension, left bundle branch block and obstructive sleep apnea.  His last PSG showed mild obstructive sleep apnea with an overall AHI of 7.6/h with severe sleep apnea during REM sleep with an AHI 27.7/h.  Oxygen  desaturations dropped as low as 86%.  He was placed on  CPAP at 9cm H2O but was intolerant and was not interested in CPAP.    He had an echo in 2022 showing EF 30-35% with mild LVE, mild RV dysfunction and coronary CTA 6/22 showed minimal  non obstructive CAD with coronary calcium  score of 16 of LCx.  At his last office visit 11/05/2022 he complained of atypical chest pain and stress PET CT was ordered but never completed. Apparently he saw his PCP recently for prostate issues and for some reason his PCP ordered a troponin that was elevated.  He has chronic DOE and that is why the PCP ordered it.  He was sent to the ER and another trop was ordered that was 21 (35 at PCP office) with a BNP at 181. He was sent home because he had no CP and his chronic DOE was stable.   He is here for follow-up today and is doing well.  He uses a stationary bike 10-30 minutes daily.  He walks 35 minutes QOD and lift weights. He has chronic DOE related to He denies any chest pain or pressure, PND, orthopnea, lower extremity edema, dizziness, palpitations or syncope.   Prior CV studies:   The following studies were reviewed today:  EKG  Past Medical History:  Diagnosis Date   Allergy     CAD (coronary artery disease), native coronary artery    25-49%  prox to mid RCA by coronary CTA 11/2020   Cardiomyopathy [facility])    DCM with EF 32% on echo 2014 and no ischemia on nuclear stress test.  EF improved to 40-45% by echo 02/2017   Chronic back pain    low back   Chronic systolic CHF (congestive heart failure), NYHA class 2 (HCC) 08/07/2016   COPD (chronic obstructive pulmonary disease) (HCC)    Dyspnea    with exertion   EKG, abnormal    history of left bundle block since 2002 per pt   GERD (gastroesophageal reflux disease)    Headache    Hypertension    LBBB (left bundle branch block)    OSA on CPAP 08/07/2016   Oxygen  deficiency    Pain    right side pain for many years   PTSD (post-traumatic stress disorder)    Sleep apnea    Past Surgical History:  Procedure Laterality Date   ANKLE SURGERY Right    CARDIAC CATHETERIZATION  yrs ago   ESOPHAGOGASTRODUODENOSCOPY (EGD) WITH PROPOFOL  N/A 01/13/2017   Procedure: ESOPHAGOGASTRODUODENOSCOPY (EGD) WITH PROPOFOL ;  Surgeon: Abran Norleen SAILOR, MD;  Location: [facility];  Service: Endoscopy;  Laterality: N/A;   HAND SURGERY     Right   HAND SURGERY     Left carpel tunnel   THUMB  ARTHROSCOPY Left      Current Meds  Medication Sig   albuterol  (VENTOLIN  HFA) 108 (90 Base) MCG/ACT inhaler INHALE 2 PUFFS BY MOUTH EVERY 6 HOURS AS NEEDED   allopurinol  (ZYLOPRIM ) 100 MG tablet Take 1 tablet (100 mg total) by mouth daily.   amLODipine  (NORVASC ) 10 MG tablet Take 1 tablet by mouth once daily   aspirin 81 MG tablet Take 81 mg by mouth daily.   budesonide -formoterol  (SYMBICORT ) 160-4.5 MCG/ACT inhaler Inhale 2 puffs into the lungs 2 (two) times daily.   carvedilol  (COREG ) 25 MG tablet Take 1 tablet (25 mg total) by mouth 2 (two) times daily with a meal.   diclofenac  Sodium (PENNSAID ) 2 % SOLN Place 2 Pump (40 mg total) onto the skin 2 (two) times daily.   digoxin  (LANOXIN ) 0.125 MG tablet Take 1 tablet by mouth once daily   doxazosin  (CARDURA ) 8 MG tablet Take 1 tablet (8 mg total) by mouth daily.    doxycycline  (VIBRA -TABS) 100 MG tablet Take 1 tablet (100 mg total) by mouth 2 (two) times daily.   FLOVENT  HFA 220 MCG/ACT inhaler Inhale into the lungs.   fluticasone  (FLONASE ) 50 MCG/ACT nasal spray Place 2 sprays into both nostrils daily.   fluticasone  (FLONASE ) 50 MCG/ACT nasal spray Place 2 sprays into both nostrils daily.   fluticasone  (FLONASE ) 50 MCG/ACT nasal spray Place 2 sprays into both nostrils daily.   furosemide  (LASIX ) 20 MG tablet Take 0.5 tablets (10 mg total) by mouth daily.   gabapentin  (NEURONTIN ) 100 MG capsule Take 1 capsule by mouth at bedtime   hydrALAZINE  (APRESOLINE ) 100 MG tablet Take 1 tablet (100 mg total) by mouth 3 (three) times daily.   hydrOXYzine  (ATARAX /VISTARIL ) 10 MG tablet 1-2 tab po q 8 hours as needed itching.   ipratropium (ATROVENT  HFA) 17 MCG/ACT inhaler Inhale 2 puffs into the lungs every 6 (six) hours as needed for wheezing.   isosorbide  mononitrate (IMDUR ) 60 MG 24 hr tablet Take 1 tablet (60 mg total) by mouth daily.   ISOSORBIDE  MONONITRATE PO Take 60 mg by mouth daily.   levocetirizine (XYZAL ) 5 MG tablet Take 1 tablet (5 mg total) by mouth every evening.   meclizine  (ANTIVERT ) 12.5 MG tablet Take 1 tablet (12.5 mg total) by mouth 3 (three) times daily as needed for dizziness.   olopatadine  (PATANOL) 0.1 % ophthalmic solution Place 1 drop into both eyes 2 (two) times daily.   omeprazole  (PRILOSEC) 40 MG capsule Take 1 capsule (40 mg total) by mouth daily.   ranitidine  (ZANTAC ) 150 MG capsule Take 1 capsule (150 mg total) by mouth 2 (two) times daily. (Patient taking differently: Take 150 mg by mouth as needed.)   rosuvastatin  (CRESTOR ) 5 MG tablet Take 1 tablet by mouth once daily   sacubitril -valsartan  (ENTRESTO ) 49-51 MG Take 1 tablet by mouth 2 (two) times daily.   spironolactone  (ALDACTONE ) 25 MG tablet Take 1 tablet (25 mg total) by mouth daily.   sulfamethoxazole -trimethoprim  (BACTRIM  DS) 800-160 MG tablet Take 1 tablet by mouth 2 (two)  times daily.   terbinafine  (LAMISIL ) 250 MG tablet Take by mouth.     Allergies:   Patient has no known allergies.   Social History   Tobacco Use   Smoking status: Never   Smokeless tobacco: Never  Vaping Use   Vaping status: Never Used  Substance Use Topics   Alcohol use: No   Drug use: No     Family Hx: The patient's family history includes Heart disease in  his father and mother. There is no history of Colon cancer, Esophageal cancer, Pancreatic cancer, or Stomach cancer.  ROS:   Please see the history of present illness.     All other systems reviewed and are negative.   Labs/Other Tests and Data Reviewed:    Recent Labs: 02/08/2024: ALT 39; BUN 12; Creatinine, Ser 0.87; Hemoglobin 16.2; Platelets 230; Potassium 3.8; Pro B Natriuretic peptide (BNP) 181.0; Sodium 141   Recent Lipid Panel Lab Results  Component Value Date/Time   CHOL 131 12/05/2022 07:18 AM   TRIG 138 12/05/2022 07:18 AM   HDL 40 12/05/2022 07:18 AM   CHOLHDL 3.3 12/05/2022 07:18 AM   CHOLHDL 4 03/18/2016 07:08 AM   LDLCALC 67 12/05/2022 07:18 AM    Wt Readings from Last 3 Encounters:  02/25/24 196 lb 12.8 oz (89.3 kg)  02/19/24 197 lb (89.4 kg)  02/08/24 197 lb (89.4 kg)     Objective:    Vital Signs:  BP (!) 172/88   Pulse 76   Ht 5' 9 (1.753 m)   Wt 196 lb 12.8 oz (89.3 kg)   SpO2 97%   BMI 29.06 kg/m   GEN: Well nourished, well developed in no acute distress HEENT: Normal NECK: No JVD; No carotid bruits LYMPHATICS: No lymphadenopathy CARDIAC:RRR, no murmurs, rubs, gallops RESPIRATORY:  Clear to auscultation without rales, wheezing or rhonchi  ABDOMEN: Soft, non-tender, non-distended MUSCULOSKELETAL:  No edema; No deformity  SKIN: Warm and dry NEUROLOGIC:  Alert and oriented x 3 PSYCHIATRIC:  Normal affect  ASSESSMENT & PLAN:    1.  DCM -prior nuclear stress test with no ischemia -echo 2018 with EF 40-45% and decreased to 30-35% on echo 03/2019 -echo 2022 with EF 30-35%  with mild LVE, mild RV dysfunction -coronary CTA 6/22 showed minimal  non obstructive CAD with coronary calcium  score of 16 of LCx -likely HTN DCM -He appears euvolemic on exam today  -Continue GDMT with carvedilol  25 mg twice daily, digoxin  0.25 mg daily, hydralazine  100 mg 3 times daily, Imdur  60 mg daily, spironolactone  25 mg daily -Add Jardiance  10 mg daily -Increase Entresto  to 97-103 mg twice daily -Repeat 2D echo in 2 months and if EF still less than 35% refer to EP for consideration of AICD -Repeat Bmet in 1 week - I have asked him to check his blood pressure twice daily for a week and call me with results  2.  Chronic systolic CHF NYHA class IIb -He appears euvolemic on exam today -weight remains stable - Continue GDMT as above including carvedilol , digoxin , hydralazine , Imdur , spironolactone  - As above adding Jardiance  10 mg daily and increasing Entresto  to 97-103 mg twice daily -I have personally reviewed and interpreted outside labs performed by patient's PCP which showed serum creatinine 0.87, potassium 3.8 on 01/31/2024  3.  HTN -BP poorly controlled on exam today but says that he has white coat HTN -Continue carvedilol  25 mg twice daily, amlodipine  10 mg daily, doxazosin  8 mg daily, hydralazine  100 mg 3 times daily, Imdur  60 mg daily, spironolactone  25 mg daily -Increasing Entresto  to 97-23 mg twice daily for better blood pressure control as well as to maximize GDMT  4.  OSA  -intolerant to CPAP -he is waking up gasping for breath -we discussed the Inspire device but he is not interested  5.  ASCAD - coronary CTA 6/22 showed minimal  non obstructive CAD with coronary calcium  score of 16 of LCx - Complained of atypical chest pain in 2024 and  stress PET CT ordered but patient did not follow through - he has had some SOB but no CP and recently had an elevated hstrop that was minimally elevated - I have recommended repeat stress PET CT but says he does not have time - he  has not had any CP and his SOB is stable with exercise - Continue aspirin 81 mg daily, carvedilol  25 mg twice daily, Crestor  5 mg daily with as needed refills  6.  Hyperlipidemia - LDL goal less than 70 -I have personally reviewed and interpreted outside labs performed by patient's PCP which showed LDL 67, HDL 40 on 12/05/2022 and ALT 39 on 02/08/2024 - Continue Crestor  5 mg daily with as needed refills  Followup with me in 2 months  Medication Adjustments/Labs and Tests Ordered: Current medicines are reviewed at length with the patient today.  Concerns regarding medicines are outlined above.  Tests Ordered: No orders of the defined types were placed in this encounter.  Medication Changes: No orders of the defined types were placed in this encounter.   Disposition:  Follow up with me in 1 year  Signed, Wilbert Bihari, MD  02/25/2024 1:28 PM    [facility]

## 2024-02-25 NOTE — Addendum Note (Signed)
 Addended by: JANIT GENI CROME on: 02/25/2024 01:54 PM   Modules accepted: Orders

## 2024-03-03 ENCOUNTER — Telehealth: Payer: Self-pay | Admitting: Cardiology

## 2024-03-03 ENCOUNTER — Telehealth: Payer: Self-pay

## 2024-03-03 ENCOUNTER — Other Ambulatory Visit (HOSPITAL_COMMUNITY): Payer: Self-pay

## 2024-03-03 ENCOUNTER — Ambulatory Visit: Payer: Self-pay | Admitting: Cardiology

## 2024-03-03 DIAGNOSIS — I428 Other cardiomyopathies: Secondary | ICD-10-CM | POA: Diagnosis not present

## 2024-03-03 DIAGNOSIS — Z79899 Other long term (current) drug therapy: Secondary | ICD-10-CM | POA: Diagnosis not present

## 2024-03-03 DIAGNOSIS — I10 Essential (primary) hypertension: Secondary | ICD-10-CM | POA: Diagnosis not present

## 2024-03-03 LAB — BASIC METABOLIC PANEL WITH GFR
BUN/Creatinine Ratio: 11 (ref 10–24)
BUN: 12 mg/dL (ref 8–27)
CO2: 23 mmol/L (ref 20–29)
Calcium: 10 mg/dL (ref 8.6–10.2)
Chloride: 102 mmol/L (ref 96–106)
Creatinine, Ser: 1.14 mg/dL (ref 0.76–1.27)
Glucose: 111 mg/dL — ABNORMAL HIGH (ref 70–99)
Potassium: 4.9 mmol/L (ref 3.5–5.2)
Sodium: 140 mmol/L (ref 134–144)
eGFR: 72 mL/min/1.73 (ref >59)

## 2024-03-03 NOTE — Telephone Encounter (Signed)
  Jardiance  1 month is $118.39   Eliquis 1 month is $114.04

## 2024-03-03 NOTE — Telephone Encounter (Signed)
 Was able to get him a [employer] grant. It did require medicare verification. but per test claim it's still good to ahead and use while they review the document uploaded to the portal. Pharmacy provided with grant billing info and instructions.

## 2024-03-03 NOTE — Telephone Encounter (Addendum)
 Patient Advocate Encounter   The patient was approved for a [employer] grant that will help cover the cost of ENTRESTO  AND ELIQUIS Total amount awarded, $4,500.  Effective: 02/02/24 - 01/31/25   APW:[id number] ERW:[id number] Hmnle:[id number] PI:[id number]   Portal required Medicare verification. Docs uploaded, pending. Per test claim grant is already active and accessible to patient.   Pharmacy provided with approval and processing information.  Ileana Lehmann, CPhT  Pharmacy Patient Advocate Specialist  Direct Number: [phone] Fax: [phone]

## 2024-03-03 NOTE — Telephone Encounter (Signed)
 Matthew Nixon is going to speak to him.

## 2024-03-03 NOTE — Telephone Encounter (Incomplete)
 Reason for walk-in: {Walk-in Reasons:[id number]:::1}  If patient is requesting to be seen today, or if patient is having symptoms:  What symptoms are being reported (if any)? N/a   Route to triage pool and ensure Teams message has been sent to the Triage [facility] chat.  3.   For medication samples, medication refills, HIM requests, appointment requests, lab-related requests, or form/record drop-off, please route to the appropriate pool.    Patient is having a problem with getting his jardiance  medication. Says the cost is to expensive and would like to talk to someone about it.

## 2024-03-04 ENCOUNTER — Telehealth: Payer: Self-pay | Admitting: Pharmacy Technician

## 2024-03-04 NOTE — Telephone Encounter (Signed)
   [employer] ID [id number]  I called [facility] and asked them to also run this grant for his jardiance . And now it is free.

## 2024-03-07 NOTE — Telephone Encounter (Signed)
-----   Message from Wilbert Bihari sent at 03/03/2024 11:58 PM EDT ----- Stable labs - continue current meds and forward to PCP ----- Message ----- From: Interface, [facility] Lab Results In Sent: 03/03/2024  10:36 PM EDT To: Wilbert JONELLE Bihari, MD

## 2024-03-07 NOTE — Telephone Encounter (Signed)
 Call to patient to discuss stable labs. Patient verbalizes understanding to continue current meds. Labs forwarded to PCP.

## 2024-03-15 ENCOUNTER — Other Ambulatory Visit: Payer: Self-pay

## 2024-03-15 DIAGNOSIS — R319 Hematuria, unspecified: Secondary | ICD-10-CM

## 2024-03-25 ENCOUNTER — Other Ambulatory Visit

## 2024-03-28 ENCOUNTER — Ambulatory Visit (HOSPITAL_COMMUNITY)
Admission: RE | Admit: 2024-03-28 | Discharge: 2024-03-28 | Disposition: A | Discharge status: Home / Self Care | Source: Ambulatory Visit | Attending: Internal Medicine | Admitting: Internal Medicine

## 2024-03-28 DIAGNOSIS — I5022 Chronic systolic (congestive) heart failure: Secondary | ICD-10-CM | POA: Diagnosis not present

## 2024-03-28 LAB — ECHOCARDIOGRAM COMPLETE
Area-P 1/2: 3.03 cm2
S' Lateral: 4.8 cm

## 2024-05-17 ENCOUNTER — Ambulatory Visit: Attending: Cardiology | Admitting: Cardiology

## 2024-05-17 VITALS — BP 150/65 | HR 86 | Ht 69.0 in | Wt 196.8 lb

## 2024-05-17 DIAGNOSIS — I10 Essential (primary) hypertension: Secondary | ICD-10-CM | POA: Diagnosis not present

## 2024-05-17 DIAGNOSIS — E785 Hyperlipidemia, unspecified: Secondary | ICD-10-CM | POA: Diagnosis not present

## 2024-05-17 DIAGNOSIS — I2510 Atherosclerotic heart disease of native coronary artery without angina pectoris: Secondary | ICD-10-CM | POA: Insufficient documentation

## 2024-05-17 DIAGNOSIS — I5022 Chronic systolic (congestive) heart failure: Secondary | ICD-10-CM | POA: Diagnosis not present

## 2024-05-17 DIAGNOSIS — G4733 Obstructive sleep apnea (adult) (pediatric): Secondary | ICD-10-CM | POA: Insufficient documentation

## 2024-05-17 DIAGNOSIS — Z79899 Other long term (current) drug therapy: Secondary | ICD-10-CM | POA: Diagnosis not present

## 2024-05-17 DIAGNOSIS — I428 Other cardiomyopathies: Secondary | ICD-10-CM | POA: Diagnosis not present

## 2024-05-17 MED ORDER — ISOSORBIDE MONONITRATE ER 30 MG PO TB24: 90.0000 mg | ORAL_TABLET | Freq: Every day | ORAL | 3 refills | Status: AC

## 2024-05-17 NOTE — Progress Notes (Signed)
 Date:  05/17/2024   ID:  Matthew Nixon, DOB 09/27/1960, MRN [id number]   PCP:  Dorina Loving, PA-C  Cardiologist:  Wilbert Bihari, MD Electrophysiologist:  None   Chief Complaint:  DCM, CHF, HTN, OSA  History of Present Illness:    Matthew Nixon is a 63 y.o. male with a hx of DCM with EF 32% (echo 2014) and repeat echo 02/2017 with improved LVF with EF 40-45% and no ischemia on nuclear stress, chronic systolic heart failure NYHA class II, COPD, hypertension, left bundle branch block and obstructive sleep apnea.  His last PSG showed mild obstructive sleep apnea with an overall AHI of 7.6/h with severe sleep apnea during REM sleep with an AHI 27.7/h.  Oxygen  desaturations dropped as low as 86%.  He was placed on  CPAP at 9cm H2O but was intolerant and was not interested in CPAP.    He had an echo in 2022 showing EF 30-35% with mild LVE, mild RV dysfunction and coronary CTA 6/22 showed minimal  non obstructive CAD with coronary calcium  score of 16 of LCx.  At his last office visit 11/05/2022 he complained of atypical chest pain and stress PET CT was ordered but never completed. Apparently he saw his PCP recently for prostate issues and for some reason his PCP ordered a troponin that was elevated.  He has chronic DOE and that is why the PCP ordered it.  He was sent to the ER and another trop was ordered that was 21 (35 at PCP office) with a BNP at 181. He was sent home because he had no CP and his chronic DOE was stable.   He is here today for followup and is doing well.  He still has some intermittent CP that he has had for some time when he overexerts himself and mild with his ADLs.   He has been resistant to getting a stress test done for the past 1-2 years saying he has not time. He still has DOE as well that has not changed. He denies any LE edema, syncope, palpitations, PND or orthopnea.   Prior CV studies:   The following studies were reviewed today:  EKG  Past Medical History:   Diagnosis Date   Allergy     CAD (coronary artery disease), native coronary artery    25-49% prox to mid RCA by coronary CTA 11/2020   Cardiomyopathy [facility])    DCM with EF 32% on echo 2014 and no ischemia on nuclear stress test.  EF improved to 40-45% by echo 02/2017   Chronic back pain    low back   Chronic systolic CHF (congestive heart failure), NYHA class 2 (HCC) 08/07/2016   COPD (chronic obstructive pulmonary disease) (HCC)    Dyspnea    with exertion   EKG, abnormal    history of left bundle block since 2002 per pt   GERD (gastroesophageal reflux disease)    Headache    Hypertension    LBBB (left bundle branch block)    OSA on CPAP 08/07/2016   Oxygen  deficiency    Pain    right side pain for many years   PTSD (post-traumatic stress disorder)    Sleep apnea    Past Surgical History:  Procedure Laterality Date   ANKLE SURGERY Right    CARDIAC CATHETERIZATION  yrs ago   ESOPHAGOGASTRODUODENOSCOPY (EGD) WITH PROPOFOL  N/A 01/13/2017   Procedure: ESOPHAGOGASTRODUODENOSCOPY (EGD) WITH PROPOFOL ;  Surgeon: Abran Norleen SAILOR, MD;  Location: [facility];  Service: Endoscopy;  Laterality: N/A;   HAND SURGERY     Right   HAND SURGERY     Left carpel tunnel   THUMB ARTHROSCOPY Left      Current Meds  Medication Sig   albuterol  (VENTOLIN  HFA) 108 (90 Base) MCG/ACT inhaler INHALE 2 PUFFS BY MOUTH EVERY 6 HOURS AS NEEDED   allopurinol  (ZYLOPRIM ) 100 MG tablet Take 1 tablet (100 mg total) by mouth daily.   amLODipine  (NORVASC ) 10 MG tablet Take 1 tablet (10 mg total) by mouth daily.   aspirin 81 MG tablet Take 81 mg by mouth daily.   budesonide -formoterol  (SYMBICORT ) 160-4.5 MCG/ACT inhaler Inhale 2 puffs into the lungs 2 (two) times daily.   carvedilol  (COREG ) 25 MG tablet Take 1 tablet (25 mg total) by mouth 2 (two) times daily with a meal.   digoxin  (LANOXIN ) 0.125 MG tablet Take 1 tablet (125 mcg total) by mouth daily.   doxazosin  (CARDURA ) 8 MG tablet Take 1 tablet (8 mg total) by  mouth daily.   doxycycline  (VIBRA -TABS) 100 MG tablet Take 1 tablet (100 mg total) by mouth 2 (two) times daily.   empagliflozin  (JARDIANCE ) 10 MG TABS tablet Take 1 tablet (10 mg total) by mouth daily before breakfast.   FLOVENT  HFA 220 MCG/ACT inhaler Inhale into the lungs.   fluticasone  (FLONASE ) 50 MCG/ACT nasal spray Place 2 sprays into both nostrils daily.   furosemide  (LASIX ) 20 MG tablet Take 0.5 tablets (10 mg total) by mouth daily.   hydrALAZINE  (APRESOLINE ) 100 MG tablet Take 1 tablet (100 mg total) by mouth 3 (three) times daily.   hydrOXYzine  (ATARAX /VISTARIL ) 10 MG tablet 1-2 tab po q 8 hours as needed itching.   ipratropium (ATROVENT  HFA) 17 MCG/ACT inhaler Inhale 2 puffs into the lungs every 6 (six) hours as needed for wheezing.   isosorbide  mononitrate (IMDUR ) 60 MG 24 hr tablet Take 1 tablet (60 mg total) by mouth daily.   levocetirizine (XYZAL ) 5 MG tablet Take 1 tablet (5 mg total) by mouth every evening.   meclizine  (ANTIVERT ) 12.5 MG tablet Take 1 tablet (12.5 mg total) by mouth 3 (three) times daily as needed for dizziness.   omeprazole  (PRILOSEC) 40 MG capsule Take 1 capsule (40 mg total) by mouth daily.   ranitidine  (ZANTAC ) 150 MG capsule Take 1 capsule (150 mg total) by mouth 2 (two) times daily.   rosuvastatin  (CRESTOR ) 5 MG tablet Take 1 tablet (5 mg total) by mouth daily.   sacubitril -valsartan  (ENTRESTO ) 97-103 MG Take 1 tablet by mouth 2 (two) times daily.   spironolactone  (ALDACTONE ) 25 MG tablet Take 1 tablet (25 mg total) by mouth daily.     Allergies:   Patient has no known allergies.   Social History   Tobacco Use   Smoking status: Never   Smokeless tobacco: Never  Vaping Use   Vaping status: Never Used  Substance Use Topics   Alcohol use: No   Drug use: No     Family Hx: The patient's family history includes Heart disease in his father and mother. There is no history of Colon cancer, Esophageal cancer, Pancreatic cancer, or Stomach  cancer.  ROS:   Please see the history of present illness.     All other systems reviewed and are negative.   Labs/Other Tests and Data Reviewed:    Recent Labs: 02/08/2024: ALT 39; Hemoglobin 16.2; Platelets 230; Pro B Natriuretic peptide (BNP) 181.0 03/03/2024: BUN 12; Creatinine, Ser 1.14; Potassium 4.9; Sodium 140   Recent Lipid Panel Lab  Results  Component Value Date/Time   CHOL 131 12/05/2022 07:18 AM   TRIG 138 12/05/2022 07:18 AM   HDL 40 12/05/2022 07:18 AM   CHOLHDL 3.3 12/05/2022 07:18 AM   CHOLHDL 4 03/18/2016 07:08 AM   LDLCALC 67 12/05/2022 07:18 AM    Wt Readings from Last 3 Encounters:  05/17/24 196 lb 12.8 oz (89.3 kg)  02/25/24 196 lb 12.8 oz (89.3 kg)  02/19/24 197 lb (89.4 kg)     Objective:    Vital Signs:  BP (!) 150/65 (BP Location: Right Arm, Patient Position: Sitting)   Pulse 86   Ht 5' 9 (1.753 m)   Wt 196 lb 12.8 oz (89.3 kg)   SpO2 97%   BMI 29.06 kg/m   GEN: Well nourished, well developed in no acute distress HEENT: Normal NECK: No JVD; No carotid bruits LYMPHATICS: No lymphadenopathy CARDIAC:RRR, no murmurs, rubs, gallops RESPIRATORY:  Clear to auscultation without rales, wheezing or rhonchi  ABDOMEN: Soft, non-tender, non-distended MUSCULOSKELETAL:  No edema; No deformity  SKIN: Warm and dry NEUROLOGIC:  Alert and oriented x 3 PSYCHIATRIC:  Normal affect  ASSESSMENT & PLAN:    DCM Chronic systolic CHF -prior nuclear stress test with no ischemia -echo 2018 with EF 40-45% and decreased to 30-35% on echo 03/2019 -echo 2022 with EF 30-35% with mild LVE, mild RV dysfunction -Echo 04/11/2024 with EF 35 to 40% and 39% by 3D volume along with abnormal septal motion from left bundle branch block, G1 DD, normal RV and -coronary CTA 6/22 showed minimal  non obstructive CAD with coronary calcium  score of 16 of LCx -likely HTN DCM -NYHA class IIb  -denies any lower extremity edema or shortness of breath and appears euvolemic on exam  today -Continue GDMT with carvedilol  25 mg twice daily, digoxin  0.125 mg daily, Jardiance  10 mg daily, hydralazine  100 mg 3 times daily, Imdur  60 mg daily, Entresto  97-103 mg twice daily and Aldactone  25 mg daily with as needed refills -Since EF by 3D volume is 39% no indication for ICD at this time -I have personally reviewed and interpreted outside labs performed by patient's PCP which showed SCr 1.14 and K+ 4.9 on 03/03/2024  HTN - BP elevated here and at home but has a lot of anxiety - Continue amlodipine  10 mg daily, carvedilol  25 mg twice daily, doxazosin  8 mg daily, hydralazine  100 mg 3 times daily, Entresto  97-23 mg twice daily, spironolactone  25 mg daily with as needed refills -increase Imdur  to 90mg  daily -I am going to refer him to our advanced HTN clinic because he is pretty much maxed out on all meds.  4.  OSA  -intolerant to CPAP -he is waking up gasping for breath -we discussed the Inspire device but he is not interested  5.  ASCAD - coronary CTA 6/22 showed minimal  non obstructive CAD with coronary calcium  score of 16 of LCx - Complained of atypical chest pain in 2024 and stress PET CT ordered patient never followed through - he continues to have problems with CP and has been resistant to stress testing because he has PTSD and is very claustraphobic so I will get a Lexican myoview  -Informed Consent   Shared Decision Making/Informed Consent The risks [chest pain, shortness of breath, cardiac arrhythmias, dizziness, blood pressure fluctuations, myocardial infarction, stroke/transient ischemic attack, nausea, vomiting, allergic reaction, radiation exposure, metallic taste sensation and life-threatening complications (estimated to be 1 in 10,000)], benefits (risk stratification, diagnosing coronary artery disease, treatment guidance) and alternatives of  a nuclear stress test were discussed in detail with Mr. Lamson and he agrees to proceed. - Continue aspirin 81 mg daily,  carvedilol  25 mg twice daily, amlodipine  10 mg daily, Crestor  5 mg daily  6.  Hyperlipidemia - LDL goal < 70 - check FLP and ALT - Continue Crestor  5 mg daily  Followup with me in 6 months with PA in 1 year with me  Medication Adjustments/Labs and Tests Ordered: Current medicines are reviewed at length with the patient today.  Concerns regarding medicines are outlined above.  Tests Ordered: No orders of the defined types were placed in this encounter.  Medication Changes: No orders of the defined types were placed in this encounter.   Disposition:  Follow up with me in 1 year  Signed, Wilbert Bihari, MD  05/17/2024 11:38 AM    [facility]

## 2024-05-17 NOTE — Addendum Note (Signed)
 Addended by: JANIT GENI CROME on: 05/17/2024 12:04 PM   Modules accepted: Orders

## 2024-05-17 NOTE — Patient Instructions (Signed)
 Medication Instructions:  Please INCREASE your Imdur  to 90 mg daily.  *If you need a refill on your cardiac medications before your next appointment, please call your pharmacy*  Lab Work: Please complete a FASTING lipid panel and an ALT today.  If you have labs (blood work) drawn today and your tests are completely normal, you will receive your results only by: MyChart Message (if you have MyChart) OR A paper copy in the mail If you have any lab test that is abnormal or we need to change your treatment, we will call you to review the results.  Testing/Procedures: Dear Mr. Bonfanti,  You are scheduled for a Myocardial Perfusion Imaging Study on:  _______ at _________.  Please arrive 15 minutes prior to your appointment time for registration and insurance purposes.  The test will take approximately 3 to 4 hours to complete; you may bring reading material.  If someone comes with you to your appointment, they will need to remain in the main lobby due to limited space in the testing area. **If you are pregnant or breastfeeding, please notify the nuclear lab prior to your appointment**  How to prepare for your Myocardial Perfusion Test: Do not eat or drink 3 hours prior to your test, except you may have water. Do not consume products containing caffeine (regular or decaffeinated) 12 hours prior to your test. (ex: coffee, chocolate, sodas, tea). Do bring a list of your current medications with you.  If not listed below, you may take your medications as normal. Do wear comfortable clothes (no dresses or overalls) and walking shoes, tennis shoes preferred (No heels or open toe shoes are allowed). Do NOT wear cologne, perfume, aftershave, or lotions (deodorant is allowed). If these instructions are not followed, your test will have to be rescheduled.  Please report to [address] ([facility]), 2nd Floor, for your test.  If you have questions or  concerns about your appointment, you can call the Nuclear Lab at [phone].  If you cannot keep your appointment, please provide 24 hours notification to the Nuclear Lab, to avoid a possible $50 charge to your account.  Follow-Up: At [facility], you and your health needs are our priority.  As part of our continuing mission to provide you with exceptional heart care, our providers are all part of one team.  This team includes your primary Cardiologist (physician) and Advanced Practice Providers or APPs (Physician Assistants and Nurse Practitioners) who all work together to provide you with the care you need, when you need it.  Your next appointment:   1 year(s)  Provider:   Wilbert Bihari, MD    We recommend signing up for the patient portal called MyChart.  Sign up information is provided on this After Visit Summary.  MyChart is used to connect with patients for Virtual Visits (Telemedicine).  Patients are able to view lab/test results, encounter notes, upcoming appointments, etc.  Non-urgent messages can be sent to your provider as well.   To learn more about what you can do with MyChart, go to [email or web address].   Other Instructions Dr. Bihari has referred you to our [facility]. Someone will call you to set up an appointment.

## 2024-05-18 DIAGNOSIS — E785 Hyperlipidemia, unspecified: Secondary | ICD-10-CM | POA: Diagnosis not present

## 2024-05-18 DIAGNOSIS — I2510 Atherosclerotic heart disease of native coronary artery without angina pectoris: Secondary | ICD-10-CM | POA: Diagnosis not present

## 2024-05-18 DIAGNOSIS — Z79899 Other long term (current) drug therapy: Secondary | ICD-10-CM | POA: Diagnosis not present

## 2024-05-18 LAB — LIPID PANEL
Chol/HDL Ratio: 3.6 ratio (ref 0.0–5.0)
Cholesterol, Total: 164 mg/dL (ref 100–199)
HDL: 45 mg/dL (ref >39)
LDL Chol Calc (NIH): 101 mg/dL — ABNORMAL HIGH (ref 0–99)
Triglycerides: 99 mg/dL (ref 0–149)
VLDL Cholesterol Cal: 18 mg/dL (ref 5–40)

## 2024-05-18 LAB — ALT: ALT: 36 IU/L (ref 0–44)

## 2024-05-20 ENCOUNTER — Ambulatory Visit: Payer: Self-pay | Admitting: Cardiology

## 2024-05-20 DIAGNOSIS — E785 Hyperlipidemia, unspecified: Secondary | ICD-10-CM

## 2024-05-20 DIAGNOSIS — Z79899 Other long term (current) drug therapy: Secondary | ICD-10-CM

## 2024-05-20 DIAGNOSIS — I2510 Atherosclerotic heart disease of native coronary artery without angina pectoris: Secondary | ICD-10-CM

## 2024-05-23 ENCOUNTER — Other Ambulatory Visit: Payer: Self-pay | Admitting: Medical

## 2024-05-24 ENCOUNTER — Ambulatory Visit (INDEPENDENT_AMBULATORY_CARE_PROVIDER_SITE_OTHER): Admitting: Family

## 2024-05-24 ENCOUNTER — Encounter (HOSPITAL_BASED_OUTPATIENT_CLINIC_OR_DEPARTMENT_OTHER): Payer: Self-pay | Admitting: Family

## 2024-05-24 ENCOUNTER — Telehealth (HOSPITAL_COMMUNITY): Payer: Self-pay | Admitting: *Deleted

## 2024-05-24 VITALS — BP 158/82 | HR 79 | Ht 69.0 in | Wt 199.0 lb

## 2024-05-24 DIAGNOSIS — I25118 Atherosclerotic heart disease of native coronary artery with other forms of angina pectoris: Secondary | ICD-10-CM | POA: Diagnosis not present

## 2024-05-24 DIAGNOSIS — I5020 Unspecified systolic (congestive) heart failure: Secondary | ICD-10-CM

## 2024-05-24 DIAGNOSIS — E785 Hyperlipidemia, unspecified: Secondary | ICD-10-CM

## 2024-05-24 DIAGNOSIS — Z79899 Other long term (current) drug therapy: Secondary | ICD-10-CM | POA: Diagnosis not present

## 2024-05-24 DIAGNOSIS — I1A0 Resistant hypertension: Secondary | ICD-10-CM | POA: Diagnosis not present

## 2024-05-24 MED ORDER — DOXAZOSIN MESYLATE 8 MG PO TABS: 12.0000 mg | ORAL_TABLET | Freq: Every evening | ORAL | 1 refills | Status: DC

## 2024-05-24 NOTE — Telephone Encounter (Signed)
 Pt reached and given instructions for stress test.

## 2024-05-24 NOTE — Patient Instructions (Addendum)
 Medication Instructions:  MOVE Doxazosin  (Cardura ) to take in the EVENING  CHANGE Doxazosin  (Cardura ) to 12mg  (1.5 tablets) every evening  If you continue to have nausea in the morning you can move your Amlodipine  tablet to the evening.   *If you need a refill on your cardiac medications before your next appointment, please call your pharmacy*  Lab Work: Your physician recommends that you return for lab work today: renin-aldosterone, TSH, digoxin  level   If you have labs (blood work) drawn today and your tests are completely normal, you will receive your results only by: MyChart Message (if you have MyChart) OR A paper copy in the mail If you have any lab test that is abnormal or we need to change your treatment, we will call you to review the results.  Testing/Procedures: Proceed with stress test as scheduled.   Follow-Up:   Your next appointment:    When you return from the [place] with Dr. Raford or Reche GORMAN Finder, NP in the [facility] Clinic in the spring 2026    Other Instructions  If your blood pressure is consistently less than 120 for the top number, reduce Doxazosin  from 10mg  to 8mg  dose.

## 2024-05-24 NOTE — Progress Notes (Signed)
 [facility] Initial Assessment:    Date:  05/24/2024   ID:  Matthew Nixon, DOB 04-Mar-1961, MRN [id number]  PCP:  Dorina Loving, PA-C  Cardiologist:  Wilbert Bihari, MD  Nephrologist:  Referring MD: Bihari Wilbert SAUNDERS, MD   CC: Hypertension  History of Present Illness:    Matthew Nixon is a 63 y.o. male with a hx of DCM with EF 32% in 2018 and repeat echo 02/2017 LVEF 40-45%, COPD, HTN, LBBB, OSA.   Seen by Dr. Bihari 05/17/24 with BP 150/65. Given persistent DOE and chest pain, lexiscan  myoview  ordered. Imdur  increased to 90mg  daily and referred to [facility].   Prior CT 2019 with normal adrenal glands.  No prior renal artery duplex.  He has OSA but has not tolerated CPAP and declined inspire device.  Presents today to establish care with [facility]. He spends half of his year in the [place]  and half in the [place].     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 He unfortunately suffers from PTSD and panic attacks from this experience. BP has been difficult to control. Blood pressure checked with arm cuff at home. Readings have been 152-157/70-80s. Feels worse in the cooler months.  he reports tobacco use never. For exercise he [place] in [place]. he eats at home and outside of the home and does follow low sodium diet. Notes feeling nauseous after taking morning medications.  He takes the majority of his antihypertensives in the morning  Previous antihypertensives:   Past Medical History:  Diagnosis Date   Allergy     CAD (coronary artery disease), native coronary artery    25-49% prox to mid RCA by coronary CTA 11/2020   Cardiomyopathy [facility])    DCM with EF 32% on echo 2014 and no ischemia on nuclear stress test.  EF improved to 40-45% by echo 02/2017   Chronic back pain    low back   Chronic systolic CHF  (congestive heart failure), NYHA class 2 (HCC) 08/07/2016   COPD (chronic obstructive pulmonary disease) (HCC)    Dyspnea    with exertion   EKG, abnormal    history of left bundle block since 2002 per pt   GERD (gastroesophageal reflux disease)    Headache    Hypertension    LBBB (left bundle branch block)    OSA on CPAP 08/07/2016   Oxygen  deficiency    Pain    right side pain for many years   PTSD (post-traumatic stress disorder)    Sleep apnea     Past Surgical History:  Procedure Laterality Date   ANKLE SURGERY Right    CARDIAC CATHETERIZATION  yrs ago   ESOPHAGOGASTRODUODENOSCOPY (EGD) WITH PROPOFOL  N/A 01/13/2017   Procedure: ESOPHAGOGASTRODUODENOSCOPY (EGD) WITH PROPOFOL ;  Surgeon: Abran Norleen SAILOR, MD;  Location: [facility] ENDOSCOPY;  Service: Endoscopy;  Laterality: N/A;   HAND SURGERY     Right   HAND SURGERY     Left carpel tunnel   THUMB ARTHROSCOPY Left     Current Medications: Current Meds  Medication Sig   furosemide  (LASIX ) 20 MG tablet Take 0.5 tablets (10 mg total) by mouth daily.   gabapentin  (NEURONTIN ) 100 MG capsule Take 1 capsule by mouth at bedtime   hydrALAZINE  (APRESOLINE ) 100 MG tablet Take 1 tablet (100 mg total) by mouth 3 (three) times daily.   hydrOXYzine  (ATARAX /VISTARIL ) 10 MG  tablet 1-2 tab po q 8 hours as needed itching.   ipratropium (ATROVENT  HFA) 17 MCG/ACT inhaler Inhale 2 puffs into the lungs every 6 (six) hours as needed for wheezing.   isosorbide  mononitrate (IMDUR ) 30 MG 24 hr tablet Take 3 tablets (90 mg total) by mouth daily.   levocetirizine (XYZAL ) 5 MG tablet Take 1 tablet (5 mg total) by mouth every evening.   meclizine  (ANTIVERT ) 12.5 MG tablet Take 1 tablet (12.5 mg total) by mouth 3 (three) times daily as needed for dizziness.   omeprazole  (PRILOSEC) 40 MG capsule Take 1 capsule (40 mg total) by mouth daily.   rosuvastatin  (CRESTOR ) 5 MG tablet Take 1 tablet (5 mg total) by mouth daily.   sacubitril -valsartan  (ENTRESTO ) 97-103 MG  Take 1 tablet by mouth 2 (two) times daily.   spironolactone  (ALDACTONE ) 25 MG tablet Take 1 tablet (25 mg total) by mouth daily.   [DISCONTINUED] ranitidine  (ZANTAC ) 150 MG capsule Take 1 capsule (150 mg total) by mouth 2 (two) times daily.     Allergies:   Patient has no known allergies.   Social History   Socioeconomic History   Marital status: Married    Spouse name: Not on file   Number of children: Not on file   Years of education: Not on file   Highest education level: Not on file  Occupational History   Not on file  Tobacco Use   Smoking status: Never   Smokeless tobacco: Never  Vaping Use   Vaping status: Never Used  Substance and Sexual Activity   Alcohol use: No   Drug use: No   Sexual activity: Not on file  Other Topics Concern   Not on file  Social History Narrative   2 years college    Social Drivers of Health   Financial Resource Strain: Not on file  Food Insecurity: Not on file  Transportation Needs: Not on file  Physical Activity: Not on file  Stress: Not on file  Social Connections: Not on file     Family History: The patient's family history includes Heart disease in his father and mother. There is no history of Colon cancer, Esophageal cancer, Pancreatic cancer, or Stomach cancer.  ROS:   Please see the history of present illness.     All other systems reviewed and are negative.  EKGs/Labs/Other Studies Reviewed:         Recent Labs: 02/08/2024: Hemoglobin 16.2; Platelets 230; Pro B Natriuretic peptide (BNP) 181.0 03/03/2024: BUN 12; Creatinine, Ser 1.14; Potassium 4.9; Sodium 140 05/18/2024: ALT 36   Recent Lipid Panel    Component Value Date/Time   CHOL 164 05/18/2024 0808   TRIG 99 05/18/2024 0808   HDL 45 05/18/2024 0808   CHOLHDL 3.6 05/18/2024 0808   CHOLHDL 4 03/18/2016 0708   VLDL 23.6 03/18/2016 0708   LDLCALC 101 (H) 05/18/2024 0808    Physical Exam:   VS:  There were no vitals taken for this visit. , BMI There is no  height or weight on file to calculate BMI. GENERAL:  Well appearing HEENT: Pupils equal round and reactive, fundi not visualized, oral mucosa unremarkable NECK:  No jugular venous distention, waveform within normal limits, carotid upstroke brisk and symmetric, no bruits, no thyromegaly LYMPHATICS:  No cervical adenopathy LUNGS:  Clear to auscultation bilaterally HEART:  RRR.  PMI not displaced or sustained,S1 and S2 within normal limits, no S3, no S4, no clicks, no rubs, no murmurs ABD:  Flat, positive bowel sounds normal  in frequency in pitch, no bruits, no rebound, no guarding, no midline pulsatile mass, no hepatomegaly, no splenomegaly EXT:  2 plus pulses throughout, no edema, no cyanosis no clubbing SKIN:  No rashes no nodules NEURO:  Cranial nerves II through XII grossly intact, motor grossly intact throughout PSYCH:  Cognitively intact, oriented to person place and time   ASSESSMENT/PLAN:    HTN - BP not at goal <130/80.  Due to nausea after morning medications move doxazosin  to evening dosing.  Increase doxazosin  from 8 mg to 12 mg nightly.  We discussed if SBP routinely less than 120 in the future would reduce to 80 mg dosing. Continue amlodipine  10 mg daily, carvedilol  25 mg twice daily, Lasix  10 mg daily, hydralazine  100 mg 3 times daily, Imdur  90 mg daily, valsartan  97-23 mg twice daily, spironolactone  25 mg daily. Future consideration includes further increasing Imdur  to 120 mg daily. Future considerations include increasing Spironolactone , defer today as he is leaving the country and we will not be able to complete follow-up labs. Secondary hypertension workup 2019 normal adrenals TSH, renin aldosterone today. Untreated OSA, as below Consider renal artery duplex at follow-up upon [place] return [place] the [place] .  We will be unable to do this prior to him to departing in the country Saturday.  HFrEF / High risk medication use - Euvolemic and well compensated on exam.  GDMT includes Coreg   25 mg twice daily, digoxin  0.125 mg daily, Jardiance  10 mg daily, Lasix  10 mg daily, hydralazine  100 mg 3 times daily, Imdur  90 mg daily, Entresto  97-103 mg twice daily, spironolactone  25 mg daily. Low sodium diet, fluid restriction <2L, and daily weights encouraged. Educated to contact our office for weight gain of 2 lbs overnight or 5 lbs in one week.  Anticipate nausea due to taking most meds together. Out of caution will update digoxin  level today  OSA-did not tolerate CPAP.  Has politely declined inspire.  Likely contributory to difficult to control hypertension.  CAD / HLD, LDL goal <70- lexiscan  myoview  upcoming later this week as per Dr. Shlomo due to chest pain, DOE.  LDL not at goal less than 70 by most recent labs. Dr. Shlomo has advised to increase rosuvastatin  to 10 mg daily.  Screening for Secondary Hypertension:     Relevant Labs/Studies:    Latest Ref Rng & Units 03/03/2024    9:45 AM 02/08/2024    5:57 PM 02/08/2024   12:00 AM  Basic Labs  Sodium 134 - 144 mmol/L 140  141  141   Potassium 3.5 - 5.2 mmol/L 4.9  3.8  4.6   Creatinine 0.76 - 1.27 mg/dL 8.85  9.12  9.01        Latest Ref Rng & Units 01/29/2022    9:11 AM 01/04/2021    8:39 AM  Thyroid    TSH 0.35 - 5.50 uIU/mL 1.87  2.71                  Disposition:    FU with MD/APP/PharmD upon return from the [place]   Medication Adjustments/Labs and Tests Ordered: Current medicines are reviewed at length with the patient today.  Concerns regarding medicines are outlined above.  No orders of the defined types were placed in this encounter.  No orders of the defined types were placed in this encounter.    Signed, Reche GORMAN Finder, NP  05/24/2024 2:09 PM    [facility]

## 2024-05-25 ENCOUNTER — Telehealth: Payer: Self-pay | Admitting: Medical

## 2024-05-25 ENCOUNTER — Other Ambulatory Visit: Payer: Self-pay | Admitting: Cardiology

## 2024-05-25 DIAGNOSIS — I428 Other cardiomyopathies: Secondary | ICD-10-CM

## 2024-05-25 DIAGNOSIS — I5022 Chronic systolic (congestive) heart failure: Secondary | ICD-10-CM

## 2024-05-25 NOTE — Addendum Note (Signed)
 Addended by: LORING ANDRIETTE HERO on: 05/25/2024 10:39 AM   Modules accepted: Orders

## 2024-05-25 NOTE — Telephone Encounter (Unsigned)
 Copied from CRM [id number]. Topic: Clinical - Medication Refill >> May 25, 2024  4:07 PM Kevelyn M wrote: Medication: hydrALAZINE  (APRESOLINE ) 100 MG tablet patient is asking for another 3 months supply because he will be out of the country until April.  Has the patient contacted their pharmacy? Yes (Agent: If no, request that the patient contact the pharmacy for the refill. If patient does not wish to contact the pharmacy document the reason why and proceed with request.) (Agent: If yes, when and what did the pharmacy advise?)  This is the patient's preferred pharmacy:  [facility] [address] [place], [place] - [address] [place] [address] Phone: [phone] Fax: [phone]  Is this the correct pharmacy for this prescription? Yes If no, delete pharmacy and type the correct one.   Has the prescription been filled recently? Yes  Is the patient out of the medication? No  Has the patient been seen for an appointment in the last year OR does the patient have an upcoming appointment? Yes  Can we respond through MyChart? No  Agent: Please be advised that Rx refills may take up to 3 business days. We ask that you follow-up with your pharmacy.

## 2024-05-26 ENCOUNTER — Ambulatory Visit (HOSPITAL_COMMUNITY)
Admission: RE | Admit: 2024-05-26 | Discharge: 2024-05-26 | Disposition: A | Discharge status: Home / Self Care | Source: Ambulatory Visit | Attending: Internal Medicine | Admitting: Internal Medicine

## 2024-05-26 ENCOUNTER — Telehealth: Payer: Self-pay

## 2024-05-26 DIAGNOSIS — I5022 Chronic systolic (congestive) heart failure: Secondary | ICD-10-CM | POA: Diagnosis present

## 2024-05-26 DIAGNOSIS — I428 Other cardiomyopathies: Secondary | ICD-10-CM | POA: Insufficient documentation

## 2024-05-26 MED ORDER — REGADENOSON 0.4 MG/5ML IV SOLN
0.4000 mg | Freq: Once | INTRAVENOUS | Status: AC
  Administered 2024-05-26: 0.4 mg via INTRAVENOUS

## 2024-05-26 MED ORDER — DOXAZOSIN MESYLATE 8 MG PO TABS: 12.0000 mg | ORAL_TABLET | Freq: Every evening | ORAL | 3 refills | Status: AC

## 2024-05-26 MED ORDER — HYDRALAZINE HCL 100 MG PO TABS: 100.0000 mg | ORAL_TABLET | Freq: Three times a day (TID) | ORAL | 0 refills | Status: AC

## 2024-05-26 MED ORDER — TECHNETIUM TC 99M TETROFOSMIN IV KIT
32.1000 | PACK | Freq: Once | INTRAVENOUS | Status: AC | PRN
  Administered 2024-05-26: 32.1 via INTRAVENOUS

## 2024-05-26 MED ORDER — ROSUVASTATIN CALCIUM 10 MG PO TABS: 10.0000 mg | ORAL_TABLET | Freq: Every day | ORAL | 3 refills | Status: AC

## 2024-05-26 MED ORDER — TECHNETIUM TC 99M TETROFOSMIN IV KIT
10.2000 | PACK | Freq: Once | INTRAVENOUS | Status: AC | PRN
  Administered 2024-05-26: 10.2 via INTRAVENOUS

## 2024-05-26 MED ORDER — REGADENOSON 0.4 MG/5ML IV SOLN
INTRAVENOUS | Status: AC
  Filled 2024-05-26: qty 5

## 2024-05-26 NOTE — Addendum Note (Signed)
 Addended by: JANIT GENI CROME on: 05/26/2024 05:14 PM   Modules accepted: Orders

## 2024-05-26 NOTE — Telephone Encounter (Signed)
 Patient calling in to advise pharmacy unable to fill a 6 month supply of cardura  for him. He requests order be changed to 90 day. Order changed in 05/24/24 encounter.

## 2024-05-26 NOTE — Telephone Encounter (Signed)
-----   Message from Wilbert Bihari sent at 05/20/2024  9:34 PM EST ----- LDL not at goal - increase Crestor  to 10mg  daily and repeat FLP and ALT in 6 weeks ----- Message ----- From: Interface, [facility] Lab Results In Sent: 05/18/2024  10:41 PM EST To: Wilbert JONELLE Bihari, MD

## 2024-05-26 NOTE — Telephone Encounter (Signed)
 Call to patient to advise LDL has increased and Dr. Shlomo recommends to increase Crestor  to 10 mg and repeat labs in 6 weeks. Patient agrees to plan and verbalizes understanding.

## 2024-05-27 LAB — MYOCARDIAL PERFUSION IMAGING
LV dias vol: 196 mL (ref 62–150)
LV sys vol: 131 mL
Nuc Stress EF: 33 %
Peak HR: 105 {beats}/min
Rest HR: 74 {beats}/min
Rest Nuclear Isotope Dose: 10.2 mCi
SDS: 0
SRS: 10
SSS: 8
ST Depression (mm): 0 mm
Stress Nuclear Isotope Dose: 32.1 mCi
TID: 1.03

## 2024-05-28 LAB — DIGOXIN LEVEL: Digoxin, Serum: <0.4 ng/mL — ABNORMAL LOW (ref 0.5–0.9)

## 2024-05-28 LAB — TSH: TSH: 2.22 u[IU]/mL (ref 0.450–4.500)

## 2024-05-28 LAB — ALDOSTERONE + RENIN ACTIVITY W/ RATIO
Aldos/Renin Ratio: 2.5 (ref 0.0–30.0)
Aldosterone: 2.4 ng/dL (ref 0.0–30.0)
Renin Activity, Plasma: 0.969 ng/mL/h (ref 0.167–5.380)

## 2024-05-30 ENCOUNTER — Ambulatory Visit (HOSPITAL_BASED_OUTPATIENT_CLINIC_OR_DEPARTMENT_OTHER): Payer: Self-pay | Admitting: Family

## 2024-05-31 NOTE — Telephone Encounter (Signed)
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  COntinue medical therapy. ----- Message ----- From: Jeffrie Oneil BROCKS, MD Sent: 05/27/2024   1:26 PM EST To: Wilbert JONELLE Bihari, MD

## 2024-05-31 NOTE — Telephone Encounter (Signed)
 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 No answer, left detailed message advising of this per DPR, also advised patient to continue medical therapy. Asked patient to call our office if any questions.

## 2024-06-06 ENCOUNTER — Telehealth: Payer: Self-pay | Admitting: Medical

## 2024-06-06 NOTE — Telephone Encounter (Signed)
 Copied from CRM #[id number]. Topic: Medicare AWV >> Jun 06, 2024 10:05 AM Nathanel DEL wrote: Called LVM 06/06/2024 to sched AWV. Please schedule AWV in office only.   Nathanel Paschal; Care Guide Ambulatory Clinical Support [place] [facility] Direct Dial: [phone]

## 2024-06-22 ENCOUNTER — Ambulatory Visit: Admitting: Cardiology
